# Patient Record
Sex: Female | Born: 1935 | Race: White | Hispanic: No | Smoking: Never smoker
Health system: Southern US, Community
[De-identification: ages and names within clinical notes are randomized; demographics above are authoritative.]

## PROBLEM LIST (undated history)

## (undated) DIAGNOSIS — M1990 Unspecified osteoarthritis, unspecified site: Secondary | ICD-10-CM

## (undated) DIAGNOSIS — F329 Major depressive disorder, single episode, unspecified: Secondary | ICD-10-CM

## (undated) DIAGNOSIS — K219 Gastro-esophageal reflux disease without esophagitis: Secondary | ICD-10-CM

## (undated) DIAGNOSIS — K224 Dyskinesia of esophagus: Secondary | ICD-10-CM

## (undated) DIAGNOSIS — F0390 Unspecified dementia without behavioral disturbance: Secondary | ICD-10-CM

## (undated) DIAGNOSIS — I10 Essential (primary) hypertension: Secondary | ICD-10-CM

## (undated) DIAGNOSIS — G629 Polyneuropathy, unspecified: Secondary | ICD-10-CM

## (undated) DIAGNOSIS — E039 Hypothyroidism, unspecified: Secondary | ICD-10-CM

## (undated) DIAGNOSIS — F419 Anxiety disorder, unspecified: Secondary | ICD-10-CM

## (undated) DIAGNOSIS — F32A Depression, unspecified: Secondary | ICD-10-CM

## (undated) DIAGNOSIS — K7460 Unspecified cirrhosis of liver: Secondary | ICD-10-CM

## (undated) DIAGNOSIS — G4700 Insomnia, unspecified: Secondary | ICD-10-CM

## (undated) HISTORY — DX: Unspecified dementia, unspecified severity, without behavioral disturbance, psychotic disturbance, mood disturbance, and anxiety: F03.90

## (undated) HISTORY — PX: ABDOMINAL HYSTERECTOMY: SHX81

## (undated) HISTORY — DX: Polyneuropathy, unspecified: G62.9

## (undated) HISTORY — PX: APPENDECTOMY: SHX54

## (undated) HISTORY — DX: Unspecified cirrhosis of liver: K74.60

## (undated) HISTORY — PX: VESICOVAGINAL FISTULA CLOSURE W/ TAH: SUR271

## (undated) HISTORY — DX: Dyskinesia of esophagus: K22.4

## (undated) HISTORY — PX: EYE SURGERY: SHX253

## (undated) HISTORY — DX: Anxiety disorder, unspecified: F41.9

## (undated) HISTORY — DX: Gastro-esophageal reflux disease without esophagitis: K21.9

## (undated) HISTORY — DX: Hypothyroidism, unspecified: E03.9

## (undated) HISTORY — DX: Insomnia, unspecified: G47.00

---

## 1990-07-07 DIAGNOSIS — K7460 Unspecified cirrhosis of liver: Secondary | ICD-10-CM

## 1990-07-07 HISTORY — DX: Unspecified cirrhosis of liver: K74.60

## 2001-02-11 ENCOUNTER — Ambulatory Visit (HOSPITAL_COMMUNITY): Admission: RE | Admit: 2001-02-11 | Discharge: 2001-02-11 | Payer: Self-pay | Admitting: Family Medicine

## 2001-02-11 ENCOUNTER — Encounter: Payer: Self-pay | Admitting: Family Medicine

## 2001-04-05 ENCOUNTER — Encounter: Payer: Self-pay | Admitting: Internal Medicine

## 2001-04-05 ENCOUNTER — Ambulatory Visit (HOSPITAL_COMMUNITY): Admission: RE | Admit: 2001-04-05 | Discharge: 2001-04-05 | Payer: Self-pay | Admitting: Internal Medicine

## 2001-05-11 ENCOUNTER — Encounter: Payer: Self-pay | Admitting: *Deleted

## 2001-05-11 ENCOUNTER — Emergency Department (HOSPITAL_COMMUNITY): Admission: EM | Admit: 2001-05-11 | Discharge: 2001-05-11 | Payer: Self-pay | Admitting: *Deleted

## 2001-05-17 ENCOUNTER — Ambulatory Visit (HOSPITAL_COMMUNITY): Admission: RE | Admit: 2001-05-17 | Discharge: 2001-05-17 | Payer: Self-pay | Admitting: Internal Medicine

## 2001-05-17 ENCOUNTER — Encounter: Payer: Self-pay | Admitting: Internal Medicine

## 2001-05-20 ENCOUNTER — Ambulatory Visit (HOSPITAL_COMMUNITY): Admission: RE | Admit: 2001-05-20 | Discharge: 2001-05-20 | Payer: Self-pay | Admitting: Internal Medicine

## 2001-05-20 ENCOUNTER — Encounter: Payer: Self-pay | Admitting: Internal Medicine

## 2001-05-27 ENCOUNTER — Inpatient Hospital Stay (HOSPITAL_COMMUNITY): Admission: EM | Admit: 2001-05-27 | Discharge: 2001-06-02 | Payer: Self-pay | Admitting: Emergency Medicine

## 2001-05-27 ENCOUNTER — Encounter: Payer: Self-pay | Admitting: Emergency Medicine

## 2001-05-29 ENCOUNTER — Encounter: Payer: Self-pay | Admitting: Internal Medicine

## 2001-06-01 ENCOUNTER — Encounter: Payer: Self-pay | Admitting: Internal Medicine

## 2001-06-02 ENCOUNTER — Encounter: Payer: Self-pay | Admitting: Internal Medicine

## 2001-06-25 ENCOUNTER — Other Ambulatory Visit: Admission: RE | Admit: 2001-06-25 | Discharge: 2001-06-25 | Payer: Self-pay | Admitting: Specialist

## 2001-07-02 ENCOUNTER — Ambulatory Visit (HOSPITAL_COMMUNITY): Admission: RE | Admit: 2001-07-02 | Discharge: 2001-07-02 | Payer: Self-pay | Admitting: Internal Medicine

## 2001-07-02 HISTORY — PX: ESOPHAGOGASTRODUODENOSCOPY: SHX1529

## 2001-07-05 ENCOUNTER — Ambulatory Visit (HOSPITAL_COMMUNITY): Admission: RE | Admit: 2001-07-05 | Discharge: 2001-07-05 | Payer: Self-pay | Admitting: Internal Medicine

## 2001-07-05 ENCOUNTER — Encounter (INDEPENDENT_AMBULATORY_CARE_PROVIDER_SITE_OTHER): Payer: Self-pay | Admitting: Internal Medicine

## 2001-10-04 ENCOUNTER — Encounter: Payer: Self-pay | Admitting: Internal Medicine

## 2001-10-04 ENCOUNTER — Ambulatory Visit (HOSPITAL_COMMUNITY): Admission: RE | Admit: 2001-10-04 | Discharge: 2001-10-04 | Payer: Self-pay | Admitting: Internal Medicine

## 2002-01-08 ENCOUNTER — Emergency Department (HOSPITAL_COMMUNITY): Admission: EM | Admit: 2002-01-08 | Discharge: 2002-01-08 | Payer: Self-pay | Admitting: Emergency Medicine

## 2002-01-08 ENCOUNTER — Encounter: Payer: Self-pay | Admitting: Emergency Medicine

## 2002-01-13 ENCOUNTER — Encounter: Admission: RE | Admit: 2002-01-13 | Discharge: 2002-04-13 | Payer: Self-pay | Admitting: Internal Medicine

## 2002-05-26 ENCOUNTER — Observation Stay (HOSPITAL_COMMUNITY): Admission: RE | Admit: 2002-05-26 | Discharge: 2002-05-27 | Payer: Self-pay | Admitting: Obstetrics & Gynecology

## 2002-08-17 ENCOUNTER — Encounter: Payer: Self-pay | Admitting: Internal Medicine

## 2002-08-17 ENCOUNTER — Ambulatory Visit (HOSPITAL_COMMUNITY): Admission: RE | Admit: 2002-08-17 | Discharge: 2002-08-17 | Payer: Self-pay | Admitting: Internal Medicine

## 2002-10-07 ENCOUNTER — Encounter: Payer: Self-pay | Admitting: Internal Medicine

## 2002-10-07 ENCOUNTER — Ambulatory Visit (HOSPITAL_COMMUNITY): Admission: RE | Admit: 2002-10-07 | Discharge: 2002-10-07 | Payer: Self-pay | Admitting: Internal Medicine

## 2002-12-08 ENCOUNTER — Other Ambulatory Visit: Admission: RE | Admit: 2002-12-08 | Discharge: 2002-12-08 | Payer: Self-pay | Admitting: Dermatology

## 2003-02-03 ENCOUNTER — Ambulatory Visit (HOSPITAL_COMMUNITY): Admission: RE | Admit: 2003-02-03 | Discharge: 2003-02-03 | Payer: Self-pay | Admitting: Internal Medicine

## 2003-02-03 ENCOUNTER — Encounter: Payer: Self-pay | Admitting: Internal Medicine

## 2003-05-22 ENCOUNTER — Other Ambulatory Visit: Admission: RE | Admit: 2003-05-22 | Discharge: 2003-05-22 | Payer: Self-pay | Admitting: Dermatology

## 2003-10-09 ENCOUNTER — Ambulatory Visit (HOSPITAL_COMMUNITY): Admission: RE | Admit: 2003-10-09 | Discharge: 2003-10-09 | Payer: Self-pay | Admitting: Internal Medicine

## 2003-10-26 ENCOUNTER — Ambulatory Visit (HOSPITAL_COMMUNITY): Admission: RE | Admit: 2003-10-26 | Discharge: 2003-10-26 | Payer: Self-pay | Admitting: Internal Medicine

## 2004-02-08 ENCOUNTER — Ambulatory Visit (HOSPITAL_COMMUNITY): Admission: RE | Admit: 2004-02-08 | Discharge: 2004-02-08 | Payer: Self-pay | Admitting: Internal Medicine

## 2004-04-12 ENCOUNTER — Ambulatory Visit (HOSPITAL_COMMUNITY): Admission: RE | Admit: 2004-04-12 | Discharge: 2004-04-12 | Payer: Self-pay | Admitting: Internal Medicine

## 2004-04-22 ENCOUNTER — Emergency Department (HOSPITAL_COMMUNITY): Admission: EM | Admit: 2004-04-22 | Discharge: 2004-04-22 | Payer: Self-pay | Admitting: Emergency Medicine

## 2004-05-27 ENCOUNTER — Ambulatory Visit: Payer: Self-pay | Admitting: Urgent Care

## 2004-07-07 DIAGNOSIS — K224 Dyskinesia of esophagus: Secondary | ICD-10-CM

## 2004-07-07 HISTORY — DX: Dyskinesia of esophagus: K22.4

## 2004-07-24 ENCOUNTER — Ambulatory Visit: Payer: Self-pay | Admitting: Internal Medicine

## 2004-09-25 ENCOUNTER — Ambulatory Visit: Payer: Self-pay | Admitting: Internal Medicine

## 2004-11-07 ENCOUNTER — Inpatient Hospital Stay (HOSPITAL_COMMUNITY): Admission: EM | Admit: 2004-11-07 | Discharge: 2004-11-18 | Payer: Self-pay | Admitting: Emergency Medicine

## 2004-11-08 HISTORY — PX: ESOPHAGOGASTRODUODENOSCOPY: SHX1529

## 2004-11-11 ENCOUNTER — Ambulatory Visit: Payer: Self-pay | Admitting: Urgent Care

## 2004-12-04 ENCOUNTER — Emergency Department (HOSPITAL_COMMUNITY): Admission: EM | Admit: 2004-12-04 | Discharge: 2004-12-04 | Payer: Self-pay | Admitting: Emergency Medicine

## 2004-12-30 ENCOUNTER — Ambulatory Visit: Payer: Self-pay | Admitting: Internal Medicine

## 2005-01-29 ENCOUNTER — Emergency Department (HOSPITAL_COMMUNITY): Admission: EM | Admit: 2005-01-29 | Discharge: 2005-01-29 | Payer: Self-pay | Admitting: Emergency Medicine

## 2005-02-25 ENCOUNTER — Ambulatory Visit: Payer: Self-pay | Admitting: Gastroenterology

## 2005-04-16 ENCOUNTER — Ambulatory Visit: Payer: Self-pay | Admitting: Internal Medicine

## 2005-05-07 ENCOUNTER — Inpatient Hospital Stay (HOSPITAL_COMMUNITY): Admission: AD | Admit: 2005-05-07 | Discharge: 2005-05-16 | Payer: Self-pay | Admitting: Internal Medicine

## 2005-05-12 ENCOUNTER — Ambulatory Visit: Payer: Self-pay | Admitting: Internal Medicine

## 2005-05-19 ENCOUNTER — Encounter (HOSPITAL_COMMUNITY): Admission: RE | Admit: 2005-05-19 | Discharge: 2005-06-18 | Payer: Self-pay | Admitting: Orthopaedic Surgery

## 2005-05-26 ENCOUNTER — Ambulatory Visit: Payer: Self-pay | Admitting: Internal Medicine

## 2005-06-26 ENCOUNTER — Ambulatory Visit (HOSPITAL_COMMUNITY): Admission: RE | Admit: 2005-06-26 | Discharge: 2005-06-26 | Payer: Self-pay | Admitting: Cardiovascular Disease

## 2005-07-31 ENCOUNTER — Inpatient Hospital Stay (HOSPITAL_COMMUNITY): Admission: EM | Admit: 2005-07-31 | Discharge: 2005-08-05 | Payer: Self-pay | Admitting: Emergency Medicine

## 2005-08-08 ENCOUNTER — Inpatient Hospital Stay (HOSPITAL_COMMUNITY): Admission: EM | Admit: 2005-08-08 | Discharge: 2005-08-12 | Payer: Self-pay | Admitting: Emergency Medicine

## 2005-08-25 ENCOUNTER — Ambulatory Visit: Payer: Self-pay | Admitting: Internal Medicine

## 2005-09-10 ENCOUNTER — Inpatient Hospital Stay (HOSPITAL_COMMUNITY): Admission: EM | Admit: 2005-09-10 | Discharge: 2005-09-16 | Payer: Self-pay | Admitting: Emergency Medicine

## 2005-09-10 ENCOUNTER — Ambulatory Visit: Payer: Self-pay | Admitting: Internal Medicine

## 2005-10-14 ENCOUNTER — Ambulatory Visit (HOSPITAL_COMMUNITY): Admission: RE | Admit: 2005-10-14 | Discharge: 2005-10-14 | Payer: Self-pay | Admitting: Urology

## 2005-10-15 IMAGING — RF DG ESOPHAGUS
8 series · 8 of 8 positions shown · non-contrast
Comparison: none.

CLINICAL DATA: Dysphagia and reflux.
DIAGNOSTIC ESOPHAGRAM

[Series 1: run · 1 of 1 slices shown (1 of 8)]
[im 1/1]
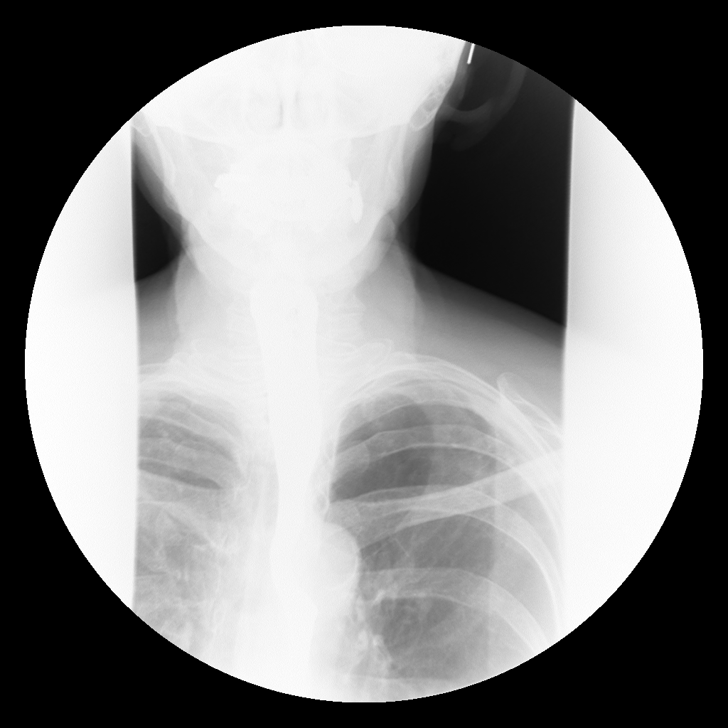

[Series 2: run · 1 of 1 slices shown (2 of 8)]
[im 1/1]
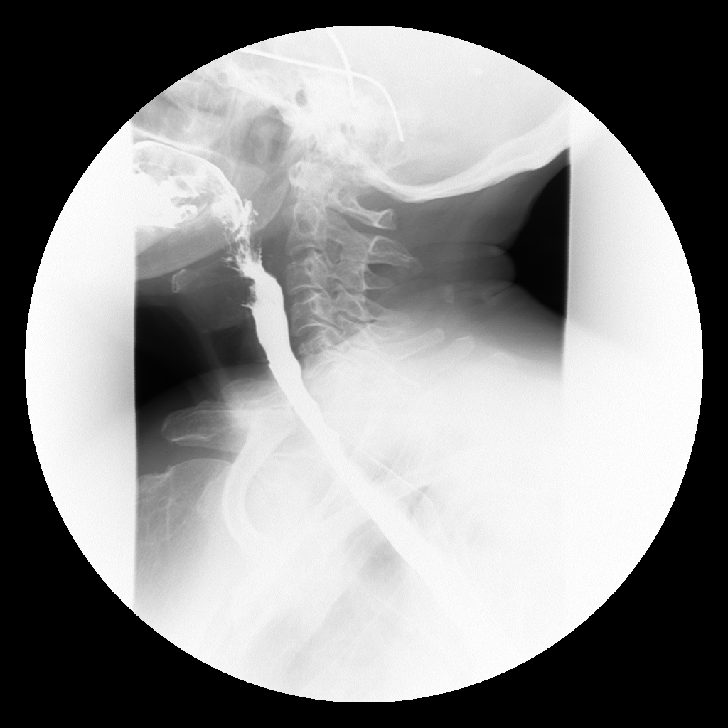

[Series 3: run · 1 of 1 slices shown (3 of 8)]
[im 1/1]
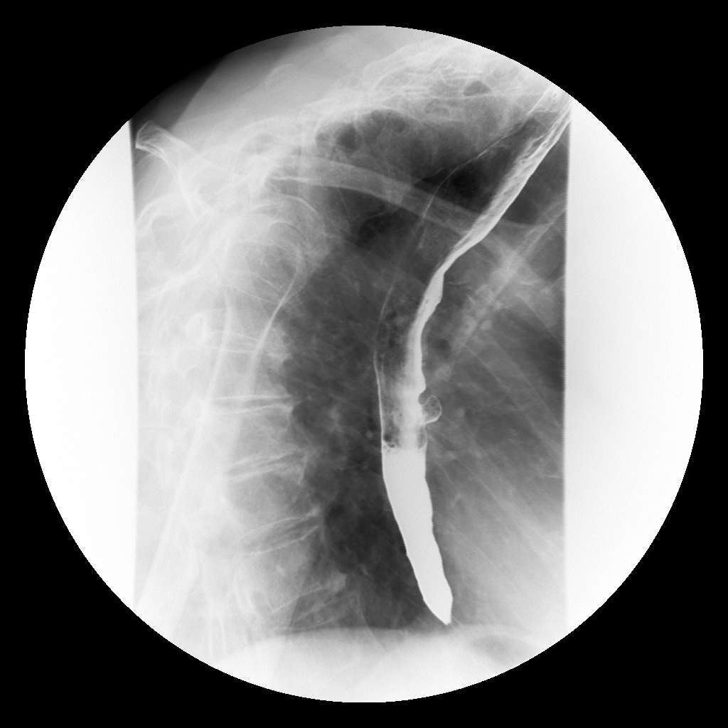

[Series 4: run · 1 of 1 slices shown (4 of 8)]
[im 1/1]
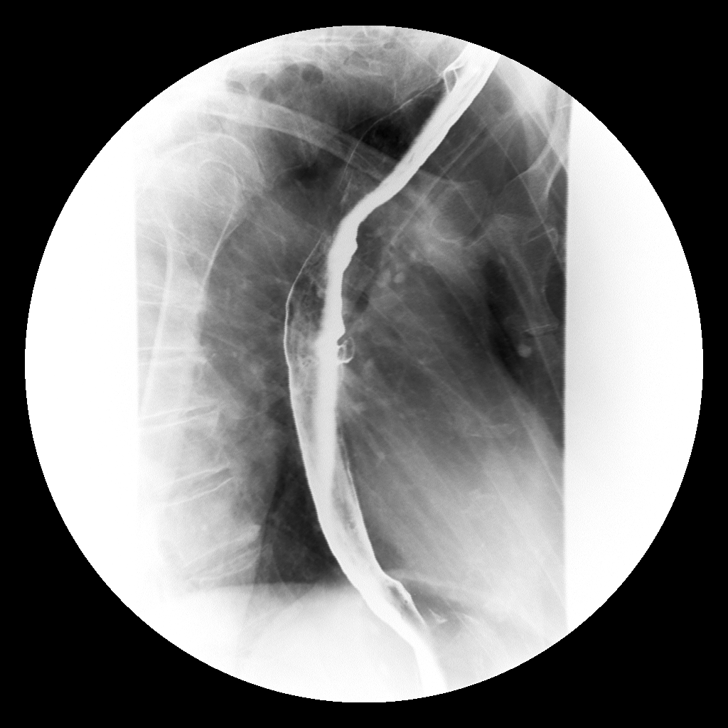

[Series 6: run · 1 of 1 slices shown (5 of 8)]
[im 1/1]
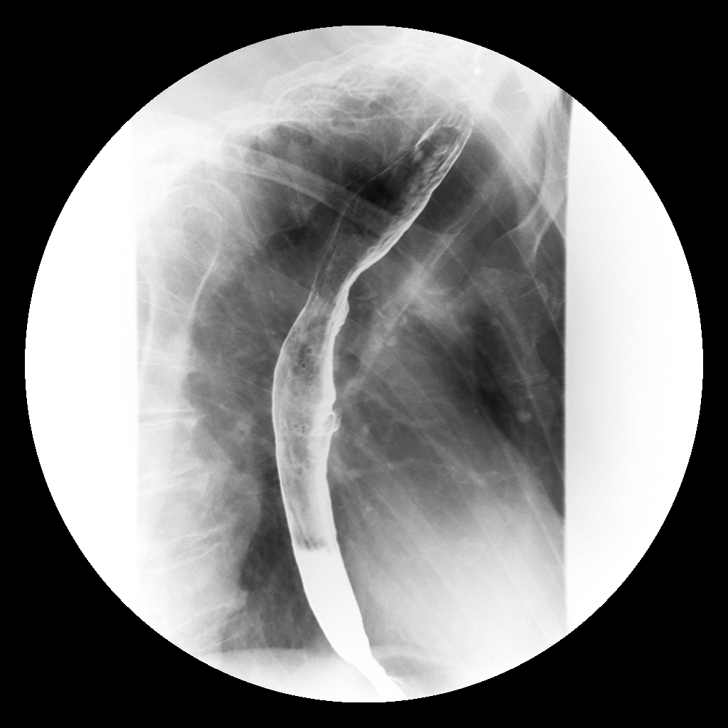

[Series 9: run · 1 of 1 slices shown (6 of 8)]
[im 1/1]
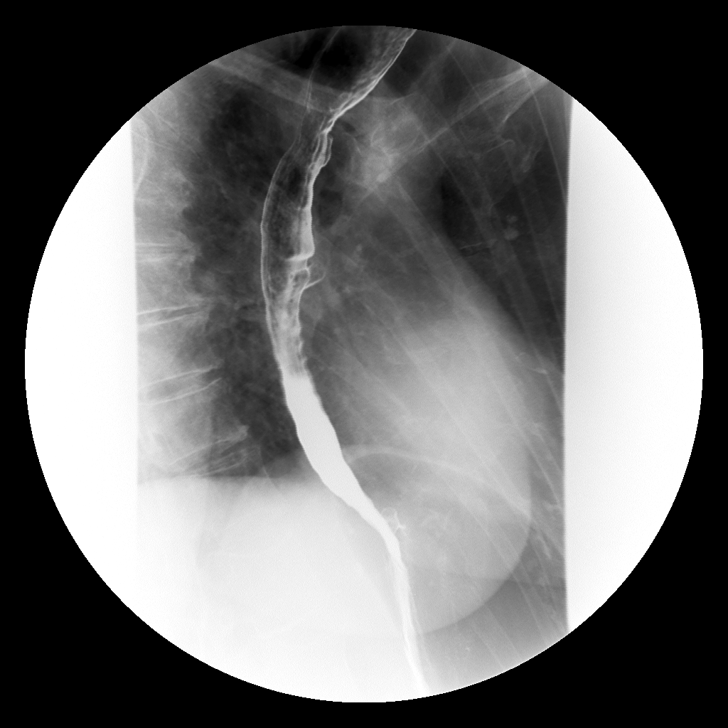

[Series 10: run · 1 of 1 slices shown (7 of 8)]
[im 1/1]
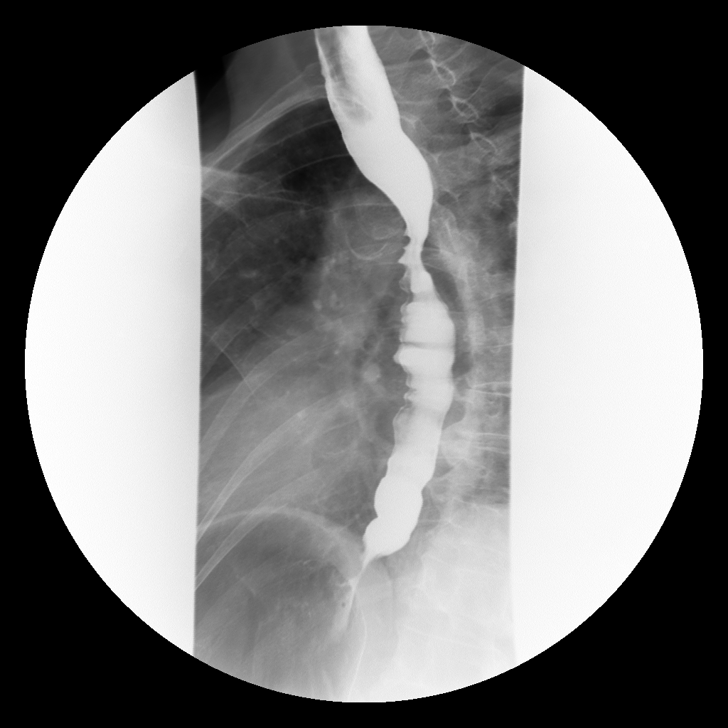

[Series 11: run · 1 of 1 slices shown (8 of 8)]
[im 1/1]
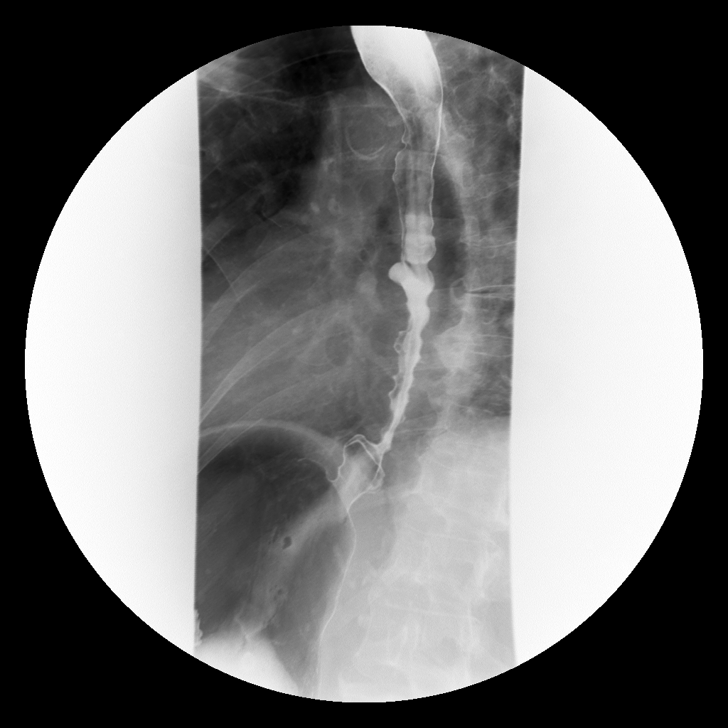

[8 of 8 positions shown; findings below may reference images not displayed]

FINDINGS: Double contrast barium esophagram shows no evidence for esophageal stricture, mucosal irregularity or mucosal ulceration. The patient does have a traction diverticulum in the mid esophagus.  Small hiatal hernia is noted.  There is no evidence for esophageal fold thickening to suggest esophagitis.
Swallowing in an RAO prone position demonstrates lack of primary peristalsis on [DATE] swallows. The patient also had multiple tertiary contractions while swallowing.
13 millimeter barium tablet was given in an upright position and the tablet initially lodged in the proximal thoracic esophagus which reproduced the discomfort she was experiencing at home.  The tablet did subsequently pass the length of the esophagus into the stomach without additional swallows of water.
IMPRESSION
Traction diverticulum in the mid esophagus.
Nonspecific esophageal motility disorder with multiple tertiary contractions noted.
Small hiatal hernia.

## 2005-11-24 ENCOUNTER — Ambulatory Visit: Payer: Self-pay | Admitting: Internal Medicine

## 2006-02-25 ENCOUNTER — Ambulatory Visit: Payer: Self-pay | Admitting: Internal Medicine

## 2006-03-13 ENCOUNTER — Ambulatory Visit (HOSPITAL_COMMUNITY): Admission: RE | Admit: 2006-03-13 | Discharge: 2006-03-13 | Payer: Self-pay | Admitting: Internal Medicine

## 2006-05-14 ENCOUNTER — Ambulatory Visit (HOSPITAL_COMMUNITY): Admission: RE | Admit: 2006-05-14 | Discharge: 2006-05-14 | Payer: Self-pay | Admitting: Urology

## 2006-07-16 ENCOUNTER — Ambulatory Visit: Payer: Self-pay | Admitting: Internal Medicine

## 2006-10-16 ENCOUNTER — Emergency Department (HOSPITAL_COMMUNITY): Admission: EM | Admit: 2006-10-16 | Discharge: 2006-10-16 | Payer: Self-pay | Admitting: Emergency Medicine

## 2007-02-05 ENCOUNTER — Ambulatory Visit: Payer: Self-pay | Admitting: Gastroenterology

## 2007-04-28 ENCOUNTER — Emergency Department (HOSPITAL_COMMUNITY): Admission: EM | Admit: 2007-04-28 | Discharge: 2007-04-28 | Payer: Self-pay | Admitting: Emergency Medicine

## 2007-04-30 ENCOUNTER — Ambulatory Visit (HOSPITAL_COMMUNITY): Admission: RE | Admit: 2007-04-30 | Discharge: 2007-04-30 | Payer: Self-pay | Admitting: Internal Medicine

## 2007-07-12 ENCOUNTER — Ambulatory Visit (HOSPITAL_COMMUNITY): Admission: RE | Admit: 2007-07-12 | Discharge: 2007-07-12 | Payer: Self-pay | Admitting: Internal Medicine

## 2007-12-09 ENCOUNTER — Emergency Department (HOSPITAL_COMMUNITY): Admission: EM | Admit: 2007-12-09 | Discharge: 2007-12-09 | Payer: Self-pay | Admitting: Emergency Medicine

## 2007-12-30 ENCOUNTER — Ambulatory Visit (HOSPITAL_COMMUNITY): Admission: RE | Admit: 2007-12-30 | Discharge: 2007-12-30 | Payer: Self-pay | Admitting: Internal Medicine

## 2008-01-14 ENCOUNTER — Ambulatory Visit (HOSPITAL_COMMUNITY): Admission: RE | Admit: 2008-01-14 | Discharge: 2008-01-14 | Payer: Self-pay | Admitting: Internal Medicine

## 2009-01-05 ENCOUNTER — Ambulatory Visit (HOSPITAL_COMMUNITY): Admission: RE | Admit: 2009-01-05 | Discharge: 2009-01-05 | Payer: Self-pay | Admitting: Internal Medicine

## 2009-01-21 ENCOUNTER — Inpatient Hospital Stay (HOSPITAL_COMMUNITY): Admission: EM | Admit: 2009-01-21 | Discharge: 2009-01-26 | Payer: Self-pay | Admitting: Emergency Medicine

## 2009-01-22 ENCOUNTER — Ambulatory Visit: Payer: Self-pay | Admitting: Gastroenterology

## 2009-01-24 ENCOUNTER — Encounter: Payer: Self-pay | Admitting: Gastroenterology

## 2009-01-25 ENCOUNTER — Ambulatory Visit: Payer: Self-pay | Admitting: Gastroenterology

## 2009-03-27 DIAGNOSIS — G609 Hereditary and idiopathic neuropathy, unspecified: Secondary | ICD-10-CM | POA: Insufficient documentation

## 2009-03-27 DIAGNOSIS — G4700 Insomnia, unspecified: Secondary | ICD-10-CM | POA: Insufficient documentation

## 2009-03-27 DIAGNOSIS — E039 Hypothyroidism, unspecified: Secondary | ICD-10-CM | POA: Insufficient documentation

## 2009-03-27 DIAGNOSIS — R110 Nausea: Secondary | ICD-10-CM

## 2009-03-27 DIAGNOSIS — K219 Gastro-esophageal reflux disease without esophagitis: Secondary | ICD-10-CM

## 2009-03-27 DIAGNOSIS — R630 Anorexia: Secondary | ICD-10-CM

## 2009-03-27 DIAGNOSIS — K7469 Other cirrhosis of liver: Secondary | ICD-10-CM

## 2009-03-27 DIAGNOSIS — F411 Generalized anxiety disorder: Secondary | ICD-10-CM | POA: Insufficient documentation

## 2009-03-27 DIAGNOSIS — F068 Other specified mental disorders due to known physiological condition: Secondary | ICD-10-CM

## 2009-03-28 ENCOUNTER — Ambulatory Visit: Payer: Self-pay | Admitting: Gastroenterology

## 2009-03-29 DIAGNOSIS — K3184 Gastroparesis: Secondary | ICD-10-CM | POA: Insufficient documentation

## 2009-04-10 ENCOUNTER — Emergency Department (HOSPITAL_COMMUNITY): Admission: EM | Admit: 2009-04-10 | Discharge: 2009-04-10 | Payer: Self-pay | Admitting: Emergency Medicine

## 2009-06-14 ENCOUNTER — Ambulatory Visit: Payer: Self-pay | Admitting: Gastroenterology

## 2009-06-15 ENCOUNTER — Emergency Department (HOSPITAL_COMMUNITY): Admission: EM | Admit: 2009-06-15 | Discharge: 2009-06-15 | Payer: Self-pay | Admitting: Emergency Medicine

## 2009-07-05 ENCOUNTER — Encounter (INDEPENDENT_AMBULATORY_CARE_PROVIDER_SITE_OTHER): Payer: Self-pay | Admitting: *Deleted

## 2009-07-13 ENCOUNTER — Encounter: Payer: Self-pay | Admitting: Gastroenterology

## 2009-07-24 ENCOUNTER — Ambulatory Visit (HOSPITAL_COMMUNITY): Admission: RE | Admit: 2009-07-24 | Discharge: 2009-07-24 | Payer: Self-pay | Admitting: Gastroenterology

## 2009-10-03 ENCOUNTER — Encounter: Payer: Self-pay | Admitting: Gastroenterology

## 2009-10-08 ENCOUNTER — Telehealth (INDEPENDENT_AMBULATORY_CARE_PROVIDER_SITE_OTHER): Payer: Self-pay

## 2009-10-08 ENCOUNTER — Encounter: Payer: Self-pay | Admitting: Gastroenterology

## 2009-12-28 ENCOUNTER — Encounter (INDEPENDENT_AMBULATORY_CARE_PROVIDER_SITE_OTHER): Payer: Self-pay | Admitting: *Deleted

## 2010-04-15 ENCOUNTER — Encounter: Payer: Self-pay | Admitting: Urgent Care

## 2010-05-12 ENCOUNTER — Inpatient Hospital Stay (HOSPITAL_COMMUNITY): Admission: EM | Admit: 2010-05-12 | Discharge: 2010-05-16 | Payer: Self-pay | Admitting: Emergency Medicine

## 2010-07-02 ENCOUNTER — Encounter: Payer: Self-pay | Admitting: Gastroenterology

## 2010-07-04 ENCOUNTER — Encounter (INDEPENDENT_AMBULATORY_CARE_PROVIDER_SITE_OTHER): Payer: Self-pay | Admitting: *Deleted

## 2010-07-28 ENCOUNTER — Encounter (INDEPENDENT_AMBULATORY_CARE_PROVIDER_SITE_OTHER): Payer: Self-pay | Admitting: Internal Medicine

## 2010-07-29 ENCOUNTER — Ambulatory Visit
Admission: RE | Admit: 2010-07-29 | Discharge: 2010-07-29 | Payer: Self-pay | Source: Home / Self Care | Attending: Gastroenterology | Admitting: Gastroenterology

## 2010-07-29 ENCOUNTER — Encounter: Payer: Self-pay | Admitting: Gastroenterology

## 2010-07-29 ENCOUNTER — Encounter: Payer: Self-pay | Admitting: Internal Medicine

## 2010-08-08 NOTE — Medication Information (Signed)
Summary: I=XIFAXAN 550MG   I=XIFAXAN 550MG    Imported By: Rexene Alberts 07/02/2010 09:33:23  _____________________________________________________________________  External Attachment:    Type:   Image     Comment:   External Document  Appended Document: I=XIFAXAN 550MG  see append on 12/23.

## 2010-08-08 NOTE — Assessment & Plan Note (Signed)
Summary: fu cirrhosis/ss   Visit Type:  Follow-up Visit Primary Care Provider:  Sherwood Gambler, M.D.  CC:  follow up- doing ok.  History of Present Illness: [name] is a pleasant 77 year old Caucasian female with a hx of cryptogenic cirrhosis, gastroparesis, and dementia who presents in follow-up. Her last AFP was drawn in April 2011: 11.8. Due for repeat. Last screening for [facility] was done in January 2011 via CT; no evidence of HCC, but a question of esophageal varices. Last documented wt here was 152 in December 2010. Today 136.  Denies abdominal pain, N/V. Has at least 2 BMs daily. No rectal bleeding. Denies edema. States she really doesn't want to eat sometimes secondary to feeling down. A little depressed, as reportedly son was taken to the hospital yesterday due to pneumonia. She has a caregiver during the day; son stays with her at night. She doesn't like boost or ensure for meal supplementation. States makes her vomit. Denies dysphagia/odynophagia.   Current Medications (verified): 1)  Potassium Chloride Cr 10 Meq Cr-Tabs (Potassium Chloride) .... Two Times A Day 2)  Synthroid 75 Mcg Tabs (Levothyroxine Sodium) .... Once Daily 3)  Triazolam 0.25 Mg Tabs (Triazolam) .... At Bedtime 4)  Aricept 5 Mg Tabs (Donepezil Hcl) .... Once Daily 5)  Nexium 40 Mg Pack (Esomeprazole Magnesium) .... As Directed 6)  Lyrica 100 Mg Caps (Pregabalin) .... Two Times A Day 7)  Calcium 600 Mg Tabs (Calcium) .... Once Daily 8)  Xifaxan 550 Mg Tabs (Rifaximin) .... 2 By Mouth Daily 9)  Furosemide 20 Mg Tabs (Furosemide) .... Take 1 Tablet By Mouth Once A Day 10)  Metoclopramide Hcl 5 Mg Tabs (Metoclopramide Hcl) .... As Directed 11)  Neurpath B .... As Directed 12)  Lactulose .... As Directed 13)  Vesicare 5 Mg Tabs (Solifenacin Succinate) .... Once Daily 14)  Metoprolol Tartrate 50 Mg Tabs (Metoprolol Tartrate) .... 1/2 Once Daily  Allergies (verified): 1)  ! Sulfa 2)  ! * Ambien 3)  ! Codeine  Past  History:  Past Medical History: Reviewed history from 03/28/2009 and no changes required. Cirrhosis, CRYPTOGENIC 1992 NONSPECIFIC esophageal motility disorder 2006 GERD Peripheral neuropathy Insomnia Dementia Hypothyroidism Anxiety Disorder  Past Surgical History: APPENDECTOMY HYSTERECTOMY eye surgery  Family History: Mother:deceased when pt was young child Father: deceased when pt was young child Sister: Diabetes, deceased  No FH of Colon Cancer:  Social History: Patient has never smoked.  Alcohol Use - no Smoking Status:  never  Review of Systems General:  Denies fever, chills, and anorexia. Eyes:  Denies blurring, irritation, and discharge. ENT:  Denies sore throat and hoarseness. CV:  Denies chest pains and syncope. Resp:  Denies dyspnea at rest and wheezing. GI:  Denies difficulty swallowing, pain on swallowing, nausea, indigestion/heartburn, abdominal pain, constipation, change in bowel habits, bloody BM's, and black BMs. GU:  Denies urinary burning and urinary frequency. MS:  Denies joint pain / LOM, joint swelling, and joint stiffness. Derm:  Denies rash, itching, and dry skin. Neuro:  Denies weakness and syncope. Psych:  Complains of depression; denies anxiety and suicidal ideation. Endo:  Denies cold intolerance and heat intolerance. Heme:  Denies bruising and bleeding.  Vital Signs:  Patient profile:   77 year old female Height:      64 inches Weight:      136 pounds BMI:     23.43 Temp:     98.0 degrees F oral Pulse rate:   76 / minute BP sitting:   124 / 80  (  right arm) Cuff size:   regular  Vitals Entered By: Hendricks Limes LPN (July 29, 2010 2:19 PM)  Physical Exam  General:  NAD, alert to person, time, situation. Unsure of president Head:  Normocephalic and atraumatic. Eyes:  sclera without icterus Lungs:  Clear throughout to auscultation. Heart:  Regular rate and rhythm; no murmurs, rubs,  or bruits. Abdomen:  +BS, non-tender,  non-distended. No HSM. NO rebound or guarding. Limited exam as pt was sitting in chair. Difficult to get up on exam table.  Pulses:  Normal pulses noted. Neurologic:  alert to person, place, situation. unsure of president.  Psych:  pleasant, somewhat flat affect. Worried about son in hospital    Impression & Recommendations:  Problem # 1:  CIRRHOSIS (ICD-34.72) 77 year old pleasant female with hx of crypotogenic cirrhosis. Last AFP in April elevated at 11.8; last radiologic exam was CT in Jan 2011: no evidence of HCC but question of esophageal varices. Due for repeat labs, CT or [place] of abd to be determined after review of AFP. If AFP stabilized, will proceed with [place] of abd; if elevated, will proceed with CTA. ?of esophageal varices, pt refuses upper endoscopy. question benefit of non-selective BB for pt. Will d/w Dr. Jena Gauss. Wt slightly down from last visit over a year ago (152 to 136). Upon review of chart, has had issues with fluctuation of wt over past several years. Will bring back for wt check in 4 weeks, assess labs.   AFP, CBC, CMP, PT/INR today Review labs to determine CTA vs [place] of abd Denies EGD to assess for ?varices: d/w Dr. Jena Gauss   Orders: T-CBC w/Diff [phone]) T-Comprehensive Metabolic Panel [phone]) T-PT (Prothrombin Time) (46962) T-AFP Tumor Markers ([id number]) Est. Patient Level II (01027)

## 2010-08-08 NOTE — Progress Notes (Signed)
Summary: ? about labwork  Phone Note Other Incoming   Caller: Larita Fife from [employer] Call: Larita Fife from [facility] called- pt came by to have some bloodwork done and they had an order for a creatinine and AFP tumor marker. They drew enough blood for it but since labs were dated January they wanted to make sure we still needed them. If not they can discard the blood. please advise. Initial call taken by: Hendricks Limes LPN,  October 08, 2009 11:49 AM     Appended Document: ? about labwork Pt needs the AFP not creatinine.   Appended Document: ? about labwork Larita Fife at [facility] aware

## 2010-08-08 NOTE — Letter (Signed)
Summary: Recall Office Visit  [facility]  [address]   [place], [place] [address]   Phone: [phone]  Fax: [phone]      July 04, 2010   [name] [address] [place], [place]  [address] [birth year]   Dear [name],   According to our records, it is time for you to schedule a follow-up office visit with [place].   At your convenience, please call [phone] to schedule an office visit. If you have any questions, concerns, or feel that this letter is in error, we would appreciate your call.   Sincerely,    Diana Eves  [facility] Ph: [phone]   Fax: [phone]

## 2010-08-08 NOTE — Letter (Signed)
Summary: Recall Office Visit  [facility]  [address]   [place], [place] [address]   Phone: [phone]  Fax: [phone]      December 28, 2009   TANEA MOGA [address] [place], [place]  [address] [birth year]   Dear Ms. Enoch,   According to our records, it is time for you to schedule a follow-up office visit with [place].   At your convenience, please call [phone] to schedule an office visit. If you have any questions, concerns, or feel that this letter is in error, we would appreciate your call.   Sincerely,    Diana Eves  [facility] Ph: [phone]   Fax: [phone]  Appended Document: Recall Office Visit Patient called and stated she did not want to be seen right now and will call back when she wants to be seen.  Patient is doing good.  jb

## 2010-08-08 NOTE — Letter (Signed)
Summary: CT OF THE ABD ORDER  CT OF THE ABD ORDER   Imported By: Ave Filter 07/13/2009 12:57:57  _____________________________________________________________________  External Attachment:    Type:   Image     Comment:   External Document

## 2010-08-09 NOTE — Medication Information (Signed)
Summary: Tax adviser   Imported By: Diana Eves 10/03/2009 08:39:15  _____________________________________________________________________  External Attachment:    Type:   Image     Comment:   External Document  Appended Document: RX Folder - xifaxan    Prescriptions: XIFAXAN 550 MG TABS (RIFAXIMIN) 2 by mouth daily  #60 x 5   Entered and Authorized by:   Leanna Battles. Dixon Boos   Signed by:   Leanna Battles Dixon Boos on 10/03/2009   Method used:   Electronically to        [facility] (retail)       [address]       [place], [place]  [address]       Ph: [phone]       Fax: [phone]   RxID:   [id number]

## 2010-08-09 NOTE — Medication Information (Signed)
Summary: [employer] [employer] 550MG   XIFAXAN 550MG    Imported By: Rexene Alberts 04/15/2010 10:11:42  _____________________________________________________________________  External Attachment:    Type:   Image     Comment:   External Document  Appended Document: XIFAXAN 550MG  Please sched OV for FU prior to further RFs.   Prescriptions: XIFAXAN 550 MG TABS (RIFAXIMIN) 2 by mouth daily  #60 x 1   Entered and Authorized by:   Joselyn Arrow FNP-BC   Signed by:   Joselyn Arrow FNP-BC on 04/15/2010   Method used:   Electronically to        [facility] (retail)       [address]       [place], [place]  [address]       Ph: [phone]       Fax: [phone]   RxID:   [id number]

## 2010-08-12 ENCOUNTER — Other Ambulatory Visit: Payer: Self-pay | Admitting: Internal Medicine

## 2010-08-12 ENCOUNTER — Encounter: Payer: Self-pay | Admitting: Internal Medicine

## 2010-08-12 DIAGNOSIS — C220 Liver cell carcinoma: Secondary | ICD-10-CM

## 2010-08-12 LAB — CONVERTED CEMR LAB
ALT: 15 units/L (ref 0–35)
AST: 30 units/L (ref 0–37)
Alkaline Phosphatase: 109 units/L (ref 39–117)
BUN: 19 mg/dL (ref 6–23)
Basophils Absolute: 0 10*3/uL (ref 0.0–0.1)
Basophils Relative: 1 % (ref 0–1)
Creatinine, Ser: 1.1 mg/dL (ref 0.40–1.20)
Eosinophils Absolute: 0.1 10*3/uL (ref 0.0–0.7)
INR: 1.16 (ref <1.50)
Lymphs Abs: 1 10*3/uL (ref 0.7–4.0)
MCHC: 31.6 g/dL (ref 30.0–36.0)
MCV: 96.7 fL (ref 78.0–100.0)
Neutrophils Relative %: 71 % (ref 43–77)
Platelets: 96 10*3/uL — ABNORMAL LOW (ref 150–400)
Prothrombin Time: 15 s (ref 11.6–15.2)
RDW: 14.2 % (ref 11.5–15.5)
Total Bilirubin: 0.9 mg/dL (ref 0.3–1.2)
WBC: 6.6 10*3/uL (ref 4.0–10.5)

## 2010-08-15 ENCOUNTER — Encounter: Payer: Self-pay | Admitting: Gastroenterology

## 2010-08-15 ENCOUNTER — Ambulatory Visit (HOSPITAL_COMMUNITY)
Admission: RE | Admit: 2010-08-15 | Discharge: 2010-08-15 | Disposition: A | Discharge status: Home / Self Care | Payer: PRIVATE HEALTH INSURANCE | Source: Ambulatory Visit | Attending: Internal Medicine | Admitting: Internal Medicine

## 2010-08-15 ENCOUNTER — Other Ambulatory Visit (HOSPITAL_COMMUNITY): Payer: Self-pay

## 2010-08-15 DIAGNOSIS — C220 Liver cell carcinoma: Secondary | ICD-10-CM

## 2010-08-15 DIAGNOSIS — K7460 Unspecified cirrhosis of liver: Secondary | ICD-10-CM | POA: Insufficient documentation

## 2010-08-15 DIAGNOSIS — K5730 Diverticulosis of large intestine without perforation or abscess without bleeding: Secondary | ICD-10-CM | POA: Insufficient documentation

## 2010-08-15 DIAGNOSIS — Z8542 Personal history of malignant neoplasm of other parts of uterus: Secondary | ICD-10-CM | POA: Insufficient documentation

## 2010-08-15 MED ORDER — IOHEXOL 300 MG/ML  SOLN
100.0000 mL | Freq: Once | INTRAMUSCULAR | Status: AC | PRN
  Administered 2010-08-15: 100 mL via INTRAVENOUS

## 2010-08-22 NOTE — Letter (Signed)
Summary: CT SCAN OF ABD/PELVIS ORDER  CT SCAN OF ABD/PELVIS ORDER   Imported By: Ave Filter 08/12/2010 11:53:53  _____________________________________________________________________  External Attachment:    Type:   Image     Comment:   External Document  Appended Document: CT SCAN OF ABD/PELVIS ORDER spoke with physician reviewer, received auth#: [id number]  Expires: 09/28/10  Appended Document: CT SCAN OF ABD/PELVIS ORDER Noted.

## 2010-08-22 NOTE — Medication Information (Signed)
Summary: [employer] 550MG   XIFAXAN 550MG    Imported By: Rexene Alberts 08/15/2010 09:55:32  _____________________________________________________________________  External Attachment:    Type:   Image     Comment:   External Document  Appended Document: [employer] 550MG     Prescriptions: XIFAXAN 550 MG TABS (RIFAXIMIN) 2 by mouth daily  #60 x 3   Entered and Authorized by:   Gerrit Halls NP   Signed by:   Gerrit Halls NP on 08/15/2010   Method used:   Faxed to ...       [facility] (retail)       [address]       [place], [place]  [address]       Ph: [phone]       Fax: [phone]   RxID:   [id number]

## 2010-09-17 LAB — TROPONIN I: Troponin I: 0.03 ng/mL (ref 0.00–0.06)

## 2010-09-17 LAB — COMPREHENSIVE METABOLIC PANEL
ALT: 15 U/L (ref 0–35)
ALT: 17 U/L (ref 0–35)
ALT: 20 U/L (ref 0–35)
AST: 33 U/L (ref 0–37)
Albumin: 2.5 g/dL — ABNORMAL LOW (ref 3.5–5.2)
Alkaline Phosphatase: 69 U/L (ref 39–117)
BUN: 14 mg/dL (ref 6–23)
CO2: 23 mEq/L (ref 19–32)
CO2: 26 mEq/L (ref 19–32)
Calcium: 8.4 mg/dL (ref 8.4–10.5)
Calcium: 8.8 mg/dL (ref 8.4–10.5)
Chloride: 111 mEq/L (ref 96–112)
Chloride: 114 mEq/L — ABNORMAL HIGH (ref 96–112)
Creatinine, Ser: 1.02 mg/dL (ref 0.4–1.2)
Creatinine, Ser: 1.18 mg/dL (ref 0.4–1.2)
GFR calc non Af Amer: 44 mL/min — ABNORMAL LOW (ref >60)
GFR calc non Af Amer: 52 mL/min — ABNORMAL LOW (ref >60)
Glucose, Bld: 89 mg/dL (ref 70–99)
Glucose, Bld: 90 mg/dL (ref 70–99)
Glucose, Bld: 99 mg/dL (ref 70–99)
Potassium: 4.1 mEq/L (ref 3.5–5.1)
Sodium: 144 mEq/L (ref 135–145)
Sodium: 144 mEq/L (ref 135–145)
Total Bilirubin: 0.9 mg/dL (ref 0.3–1.2)
Total Bilirubin: 1 mg/dL (ref 0.3–1.2)
Total Protein: 4.9 g/dL — ABNORMAL LOW (ref 6.0–8.3)
Total Protein: 5.3 g/dL — ABNORMAL LOW (ref 6.0–8.3)

## 2010-09-17 LAB — DIFFERENTIAL
Basophils Absolute: 0 10*3/uL (ref 0.0–0.1)
Basophils Absolute: 0 10*3/uL (ref 0.0–0.1)
Basophils Absolute: 0 10*3/uL (ref 0.0–0.1)
Basophils Relative: 0 % (ref 0–1)
Basophils Relative: 1 % (ref 0–1)
Eosinophils Absolute: 0.1 10*3/uL (ref 0.0–0.7)
Eosinophils Absolute: 0.1 10*3/uL (ref 0.0–0.7)
Eosinophils Absolute: 0.1 10*3/uL (ref 0.0–0.7)
Eosinophils Absolute: 0.2 10*3/uL (ref 0.0–0.7)
Eosinophils Relative: 2 % (ref 0–5)
Eosinophils Relative: 3 % (ref 0–5)
Eosinophils Relative: 4 % (ref 0–5)
Lymphocytes Relative: 23 % (ref 12–46)
Lymphs Abs: 0.8 10*3/uL (ref 0.7–4.0)
Lymphs Abs: 0.9 10*3/uL (ref 0.7–4.0)
Monocytes Absolute: 0.3 10*3/uL (ref 0.1–1.0)
Monocytes Absolute: 0.3 10*3/uL (ref 0.1–1.0)
Monocytes Absolute: 0.5 10*3/uL (ref 0.1–1.0)
Monocytes Relative: 10 % (ref 3–12)
Monocytes Relative: 11 % (ref 3–12)
Neutrophils Relative %: 60 % (ref 43–77)
Neutrophils Relative %: 73 % (ref 43–77)

## 2010-09-17 LAB — CBC
HCT: 35.1 % — ABNORMAL LOW (ref 36.0–46.0)
HCT: 36 % (ref 36.0–46.0)
HCT: 38.8 % (ref 36.0–46.0)
HCT: 39.5 % (ref 36.0–46.0)
Hemoglobin: 12.2 g/dL (ref 12.0–15.0)
Hemoglobin: 13.4 g/dL (ref 12.0–15.0)
MCH: 31.2 pg (ref 26.0–34.0)
MCH: 31.3 pg (ref 26.0–34.0)
MCHC: 34 g/dL (ref 30.0–36.0)
MCHC: 34 g/dL (ref 30.0–36.0)
MCV: 91.5 fL (ref 78.0–100.0)
MCV: 92.1 fL (ref 78.0–100.0)
Platelets: 71 10*3/uL — ABNORMAL LOW (ref 150–400)
RBC: 3.84 MIL/uL — ABNORMAL LOW (ref 3.87–5.11)
RBC: 3.91 MIL/uL (ref 3.87–5.11)
RBC: 4.28 MIL/uL (ref 3.87–5.11)
RDW: 14 % (ref 11.5–15.5)
RDW: 14.1 % (ref 11.5–15.5)
WBC: 3.2 10*3/uL — ABNORMAL LOW (ref 4.0–10.5)
WBC: 3.4 10*3/uL — ABNORMAL LOW (ref 4.0–10.5)
WBC: 5.6 10*3/uL (ref 4.0–10.5)

## 2010-09-17 LAB — URINALYSIS, ROUTINE W REFLEX MICROSCOPIC
Bilirubin Urine: NEGATIVE
Glucose, UA: NEGATIVE mg/dL
Ketones, ur: NEGATIVE mg/dL
Nitrite: NEGATIVE
Specific Gravity, Urine: 1.01 (ref 1.005–1.030)
pH: 6.5 (ref 5.0–8.0)

## 2010-09-17 LAB — CK TOTAL AND CKMB (NOT AT ARMC)
CK, MB: 1.2 ng/mL (ref 0.3–4.0)
Relative Index: INVALID (ref 0.0–2.5)
Total CK: 45 U/L (ref 7–177)

## 2010-09-17 LAB — LIPASE, BLOOD: Lipase: 64 U/L — ABNORMAL HIGH (ref 11–59)

## 2010-10-07 ENCOUNTER — Emergency Department (HOSPITAL_COMMUNITY): Payer: PRIVATE HEALTH INSURANCE

## 2010-10-07 ENCOUNTER — Emergency Department (HOSPITAL_COMMUNITY)
Admission: EM | Admit: 2010-10-07 | Discharge: 2010-10-07 | Disposition: A | Discharge status: Home / Self Care | Payer: PRIVATE HEALTH INSURANCE | Attending: Emergency Medicine | Admitting: Emergency Medicine

## 2010-10-07 DIAGNOSIS — G319 Degenerative disease of nervous system, unspecified: Secondary | ICD-10-CM | POA: Insufficient documentation

## 2010-10-07 DIAGNOSIS — R51 Headache: Secondary | ICD-10-CM | POA: Insufficient documentation

## 2010-10-07 DIAGNOSIS — Z79899 Other long term (current) drug therapy: Secondary | ICD-10-CM | POA: Insufficient documentation

## 2010-10-07 DIAGNOSIS — I6789 Other cerebrovascular disease: Secondary | ICD-10-CM | POA: Insufficient documentation

## 2010-10-07 DIAGNOSIS — F29 Unspecified psychosis not due to a substance or known physiological condition: Secondary | ICD-10-CM | POA: Insufficient documentation

## 2010-10-07 DIAGNOSIS — F0280 Dementia in other diseases classified elsewhere without behavioral disturbance: Secondary | ICD-10-CM | POA: Insufficient documentation

## 2010-10-07 DIAGNOSIS — I10 Essential (primary) hypertension: Secondary | ICD-10-CM | POA: Insufficient documentation

## 2010-10-07 LAB — URINALYSIS, ROUTINE W REFLEX MICROSCOPIC
Bilirubin Urine: NEGATIVE
Glucose, UA: NEGATIVE mg/dL
Hgb urine dipstick: NEGATIVE
Protein, ur: NEGATIVE mg/dL
Urobilinogen, UA: 0.2 mg/dL (ref 0.0–1.0)

## 2010-10-07 LAB — COMPREHENSIVE METABOLIC PANEL
ALT: 22 U/L (ref 0–35)
Albumin: 3.2 g/dL — ABNORMAL LOW (ref 3.5–5.2)
Alkaline Phosphatase: 108 U/L (ref 39–117)
BUN: 20 mg/dL (ref 6–23)
Chloride: 108 mEq/L (ref 96–112)
Glucose, Bld: 87 mg/dL (ref 70–99)
Potassium: 4.1 mEq/L (ref 3.5–5.1)
Sodium: 139 mEq/L (ref 135–145)
Total Bilirubin: 1.1 mg/dL (ref 0.3–1.2)
Total Protein: 6.3 g/dL (ref 6.0–8.3)

## 2010-10-07 LAB — DIFFERENTIAL
Basophils Absolute: 0 10*3/uL (ref 0.0–0.1)
Basophils Relative: 1 % (ref 0–1)
Eosinophils Absolute: 0.2 10*3/uL (ref 0.0–0.7)
Monocytes Absolute: 0.6 10*3/uL (ref 0.1–1.0)
Monocytes Relative: 11 % (ref 3–12)
Neutro Abs: 4 10*3/uL (ref 1.7–7.7)

## 2010-10-07 LAB — CBC
MCH: 30.9 pg (ref 26.0–34.0)
MCHC: 33.3 g/dL (ref 30.0–36.0)
Platelets: 95 10*3/uL — ABNORMAL LOW (ref 150–400)
RDW: 13.1 % (ref 11.5–15.5)

## 2010-10-07 LAB — PROTIME-INR
INR: 1.14 (ref 0.00–1.49)
Prothrombin Time: 14.8 seconds (ref 11.6–15.2)

## 2010-10-07 LAB — URINE MICROSCOPIC-ADD ON

## 2010-10-08 LAB — URINALYSIS, ROUTINE W REFLEX MICROSCOPIC
Glucose, UA: NEGATIVE mg/dL
Ketones, ur: NEGATIVE mg/dL
Nitrite: NEGATIVE
Specific Gravity, Urine: 1.01 (ref 1.005–1.030)
pH: 7 (ref 5.0–8.0)

## 2010-10-08 LAB — BASIC METABOLIC PANEL
BUN: 17 mg/dL (ref 6–23)
GFR calc Af Amer: 58 mL/min — ABNORMAL LOW (ref >60)
GFR calc non Af Amer: 48 mL/min — ABNORMAL LOW (ref >60)
Potassium: 3.4 mEq/L — ABNORMAL LOW (ref 3.5–5.1)

## 2010-10-08 LAB — DIFFERENTIAL
Lymphocytes Relative: 20 % (ref 12–46)
Lymphs Abs: 1.1 10*3/uL (ref 0.7–4.0)
Monocytes Relative: 11 % (ref 3–12)
Neutrophils Relative %: 67 % (ref 43–77)

## 2010-10-08 LAB — CBC
HCT: 39.3 % (ref 36.0–46.0)
Platelets: 74 10*3/uL — ABNORMAL LOW (ref 150–400)
RBC: 4.17 MIL/uL (ref 3.87–5.11)
WBC: 5.5 10*3/uL (ref 4.0–10.5)

## 2010-10-10 LAB — COMPREHENSIVE METABOLIC PANEL
ALT: 45 U/L — ABNORMAL HIGH (ref 0–35)
AST: 61 U/L — ABNORMAL HIGH (ref 0–37)
CO2: 26 mEq/L (ref 19–32)
Calcium: 9.1 mg/dL (ref 8.4–10.5)
Chloride: 108 mEq/L (ref 96–112)
GFR calc Af Amer: >60 mL/min (ref >60)
GFR calc non Af Amer: 52 mL/min — ABNORMAL LOW (ref >60)
Potassium: 3.7 mEq/L (ref 3.5–5.1)
Sodium: 141 mEq/L (ref 135–145)
Total Bilirubin: 1.3 mg/dL — ABNORMAL HIGH (ref 0.3–1.2)

## 2010-10-10 LAB — URINALYSIS, ROUTINE W REFLEX MICROSCOPIC
Glucose, UA: NEGATIVE mg/dL
Leukocytes, UA: NEGATIVE
pH: 6 (ref 5.0–8.0)

## 2010-10-10 LAB — AMMONIA: Ammonia: 20 umol/L (ref 11–35)

## 2010-10-10 LAB — DIFFERENTIAL
Eosinophils Absolute: 0.1 10*3/uL (ref 0.0–0.7)
Eosinophils Relative: 2 % (ref 0–5)
Lymphs Abs: 0.8 10*3/uL (ref 0.7–4.0)

## 2010-10-10 LAB — URINE MICROSCOPIC-ADD ON

## 2010-10-10 LAB — CBC
RBC: 4.28 MIL/uL (ref 3.87–5.11)
WBC: 4.8 10*3/uL (ref 4.0–10.5)

## 2010-10-13 LAB — CBC
HCT: 37.2 % (ref 36.0–46.0)
HCT: 38.8 % (ref 36.0–46.0)
HCT: 40.8 % (ref 36.0–46.0)
HCT: 42.4 % (ref 36.0–46.0)
Hemoglobin: 13 g/dL (ref 12.0–15.0)
MCHC: 34.4 g/dL (ref 30.0–36.0)
MCHC: 34.9 g/dL (ref 30.0–36.0)
MCHC: 35.1 g/dL (ref 30.0–36.0)
MCV: 93.9 fL (ref 78.0–100.0)
MCV: 94.2 fL (ref 78.0–100.0)
MCV: 94.3 fL (ref 78.0–100.0)
MCV: 95.2 fL (ref 78.0–100.0)
Platelets: 73 10*3/uL — ABNORMAL LOW (ref 150–400)
Platelets: 75 10*3/uL — ABNORMAL LOW (ref 150–400)
Platelets: 84 10*3/uL — ABNORMAL LOW (ref 150–400)
Platelets: 85 10*3/uL — ABNORMAL LOW (ref 150–400)
RBC: 3.96 MIL/uL (ref 3.87–5.11)
RBC: 4.11 MIL/uL (ref 3.87–5.11)
RBC: 4.28 MIL/uL (ref 3.87–5.11)
RDW: 13.1 % (ref 11.5–15.5)
RDW: 13.3 % (ref 11.5–15.5)
WBC: 4.6 10*3/uL (ref 4.0–10.5)
WBC: 7.5 10*3/uL (ref 4.0–10.5)
WBC: 7.6 10*3/uL (ref 4.0–10.5)

## 2010-10-13 LAB — COMPREHENSIVE METABOLIC PANEL
ALT: 28 U/L (ref 0–35)
ALT: 29 U/L (ref 0–35)
ALT: 33 U/L (ref 0–35)
ALT: 35 U/L (ref 0–35)
AST: 46 U/L — ABNORMAL HIGH (ref 0–37)
AST: 47 U/L — ABNORMAL HIGH (ref 0–37)
AST: 52 U/L — ABNORMAL HIGH (ref 0–37)
AST: 53 U/L — ABNORMAL HIGH (ref 0–37)
Albumin: 2.6 g/dL — ABNORMAL LOW (ref 3.5–5.2)
Albumin: 2.6 g/dL — ABNORMAL LOW (ref 3.5–5.2)
Albumin: 3 g/dL — ABNORMAL LOW (ref 3.5–5.2)
Albumin: 3 g/dL — ABNORMAL LOW (ref 3.5–5.2)
Alkaline Phosphatase: 76 U/L (ref 39–117)
Alkaline Phosphatase: 92 U/L (ref 39–117)
BUN: 14 mg/dL (ref 6–23)
CO2: 26 mEq/L (ref 19–32)
CO2: 26 mEq/L (ref 19–32)
CO2: 27 mEq/L (ref 19–32)
CO2: 32 mEq/L (ref 19–32)
Calcium: 8.7 mg/dL (ref 8.4–10.5)
Calcium: 9 mg/dL (ref 8.4–10.5)
Chloride: 107 mEq/L (ref 96–112)
Chloride: 111 mEq/L (ref 96–112)
Chloride: 111 mEq/L (ref 96–112)
Creatinine, Ser: 1.33 mg/dL — ABNORMAL HIGH (ref 0.4–1.2)
Creatinine, Ser: 1.4 mg/dL — ABNORMAL HIGH (ref 0.4–1.2)
GFR calc Af Amer: 43 mL/min — ABNORMAL LOW (ref >60)
GFR calc Af Amer: 46 mL/min — ABNORMAL LOW (ref >60)
GFR calc Af Amer: 58 mL/min — ABNORMAL LOW (ref >60)
GFR calc non Af Amer: 32 mL/min — ABNORMAL LOW (ref >60)
GFR calc non Af Amer: 38 mL/min — ABNORMAL LOW (ref >60)
GFR calc non Af Amer: 48 mL/min — ABNORMAL LOW (ref >60)
Glucose, Bld: 146 mg/dL — ABNORMAL HIGH (ref 70–99)
Potassium: 3.7 mEq/L (ref 3.5–5.1)
Potassium: 3.9 mEq/L (ref 3.5–5.1)
Potassium: 4.2 mEq/L (ref 3.5–5.1)
Sodium: 143 mEq/L (ref 135–145)
Sodium: 143 mEq/L (ref 135–145)
Total Bilirubin: 0.8 mg/dL (ref 0.3–1.2)
Total Bilirubin: 1 mg/dL (ref 0.3–1.2)
Total Bilirubin: 1.2 mg/dL (ref 0.3–1.2)
Total Protein: 5.6 g/dL — ABNORMAL LOW (ref 6.0–8.3)
Total Protein: 6.7 g/dL (ref 6.0–8.3)

## 2010-10-13 LAB — URINE CULTURE: Colony Count: NO GROWTH

## 2010-10-13 LAB — DIFFERENTIAL
Basophils Absolute: 0 10*3/uL (ref 0.0–0.1)
Basophils Absolute: 0 10*3/uL (ref 0.0–0.1)
Basophils Relative: 0 % (ref 0–1)
Basophils Relative: 0 % (ref 0–1)
Basophils Relative: 0 % (ref 0–1)
Basophils Relative: 1 % (ref 0–1)
Eosinophils Absolute: 0 10*3/uL (ref 0.0–0.7)
Eosinophils Absolute: 0 10*3/uL (ref 0.0–0.7)
Eosinophils Absolute: 0.1 10*3/uL (ref 0.0–0.7)
Eosinophils Absolute: 0.1 10*3/uL (ref 0.0–0.7)
Eosinophils Absolute: 0.2 10*3/uL (ref 0.0–0.7)
Eosinophils Relative: 0 % (ref 0–5)
Eosinophils Relative: 0 % (ref 0–5)
Eosinophils Relative: 2 % (ref 0–5)
Eosinophils Relative: 3 % (ref 0–5)
Eosinophils Relative: 3 % (ref 0–5)
Lymphocytes Relative: 18 % (ref 12–46)
Lymphocytes Relative: 8 % — ABNORMAL LOW (ref 12–46)
Lymphs Abs: 0.6 10*3/uL — ABNORMAL LOW (ref 0.7–4.0)
Lymphs Abs: 0.8 10*3/uL (ref 0.7–4.0)
Lymphs Abs: 1 10*3/uL (ref 0.7–4.0)
Monocytes Absolute: 0.2 10*3/uL (ref 0.1–1.0)
Monocytes Absolute: 0.5 10*3/uL (ref 0.1–1.0)
Monocytes Relative: 10 % (ref 3–12)
Monocytes Relative: 11 % (ref 3–12)
Monocytes Relative: 3 % (ref 3–12)
Monocytes Relative: 8 % (ref 3–12)
Neutro Abs: 6.7 10*3/uL (ref 1.7–7.7)
Neutrophils Relative %: 65 % (ref 43–77)
Neutrophils Relative %: 81 % — ABNORMAL HIGH (ref 43–77)
Neutrophils Relative %: 90 % — ABNORMAL HIGH (ref 43–77)

## 2010-10-13 LAB — BASIC METABOLIC PANEL
BUN: 18 mg/dL (ref 6–23)
CO2: 27 mEq/L (ref 19–32)
Chloride: 110 mEq/L (ref 96–112)
Creatinine, Ser: 1.21 mg/dL — ABNORMAL HIGH (ref 0.4–1.2)
Potassium: 3.9 mEq/L (ref 3.5–5.1)

## 2010-10-13 LAB — URINALYSIS, ROUTINE W REFLEX MICROSCOPIC
Bilirubin Urine: NEGATIVE
Ketones, ur: NEGATIVE mg/dL
Leukocytes, UA: NEGATIVE
Nitrite: NEGATIVE
Specific Gravity, Urine: 1.01 (ref 1.005–1.030)
Urobilinogen, UA: 1 mg/dL (ref 0.0–1.0)
pH: 7 (ref 5.0–8.0)

## 2010-10-13 LAB — TSH: TSH: 2.216 u[IU]/mL (ref 0.350–4.500)

## 2010-10-13 LAB — URINE MICROSCOPIC-ADD ON

## 2010-10-13 LAB — CK TOTAL AND CKMB (NOT AT ARMC): Relative Index: 1.6 (ref 0.0–2.5)

## 2010-10-13 LAB — AMMONIA: Ammonia: 30 umol/L (ref 11–35)

## 2010-10-13 LAB — AFP TUMOR MARKER: AFP-Tumor Marker: 10.2 ng/mL — ABNORMAL HIGH (ref 0.0–8.0)

## 2010-10-13 LAB — TROPONIN I: Troponin I: 0.04 ng/mL (ref 0.00–0.06)

## 2010-11-19 NOTE — Group Therapy Note (Signed)
[name], [name]               ACCOUNT NO.:  [id number]   MEDICAL RECORD NO.:  [id number]          PATIENT TYPE:  INP   LOCATION:  A313                          FACILITY:  [facility]   PHYSICIAN:  Melissa L. Ladona Ridgel, MD  DATE OF BIRTH:  [birth year]   DATE OF PROCEDURE:  01/26/2009  DATE OF DISCHARGE:                                 PROGRESS NOTE   Please note this patient was seen in conjunction with our physician's  assistant.  Please see the cumulative documentation including written  note and dictated in summary.   The patient states that she had a formed bowel movement today.  She does  still have a little bit of vomiting with eating.  Otherwise she is  slightly confused and thinks she is in an apartment.  She does accept  reorientation and states that she is in the hospital.  Otherwise she  complains of no pain.  No shortness of breath but does say I am weak.   Temperature is 97.4, blood pressure 136/65, pulse 78, respirations 20.   She has had seven voids and no stools are documented but the patient  states she did have a stool.   PHYSICAL EXAMINATION:  A well-developed, moderately obese white female  in no acute distress.  She is normocephalic, atraumatic.  Pupils equal,  round and reactive to light.  Extraocular muscles intact.  She has  anicteric sclerae.  Examination of the nose reveals septum midline  without any discharge.  Examination of ears reveal no discharge and no  external lesions.  The examination of the mouth reveals the patient has  no oral or lip lesions.  Neck is supple.  There is no JVD.  No lymph  nodes.  No carotid bruits.  Chest is decreased but clear to  auscultation.  There is no rhonchi, rales or wheezes.  Cardiovascular,  there is a regular rate and rhythm.  Positive S1, S2, no S3, S4, no  murmurs, rubs or gallops.  Abdomen is obese, nontender, nondistended  with positive bowel sounds.  There is no hepatosplenomegaly that I can  palpate and she is less  distended than previously.   The extremities show multiple areas of ecchymosis consistent with her  thrombocytopenia.  Strength is probably a 4/5 in all extremities  symmetrical.   Neurologically, cranial nerves II-XII are intact.  The patient is awake,  she is alert.  She is not oriented at all times but can be reoriented.  Plantars are downgoing.  There is no flap.   PERTINENT LABORATORIES:  Barium swallow was completed.  There is a 12.5  mm tablet passed easily however, she has severe diffuse esophageal  dysmotility with poor clearance of the barium and she did have  retrograde peristalsis of the barium during the exam.  She has a  moderate sized esophageal diverticulum.  Her CBC shows a white count of  7.6, hemoglobin 13.6, hematocrit 38.8, platelets of 85.  Her sodium is  142, potassium 2.9, chloride 110, CO2 27, BUN is 18, creatinine 1.21 and  glucose 105.  Her AST is 10.2 which is  mildly elevated.  The patient was  able to walk greater than 100 feet today.   ASSESSMENT/PLAN:  The patient is an 77 year old female presented to the  hospital with weakness, nausea, vomiting and diarrhea.  1. Cryptogenic cirrhosis with thrombocytopenia, loose stool secondary      to lactulose.  Her lactulose been discontinued for this time.  We      will clarify with Dr. Loreta Ave how frequently she should be using this      at home.  Perhaps we can titrate just in the number of stools per      day that she needs to have.  2. Degenerative joint disease involving the knees.  The patient has      had injections by Dr. Hilda Lias and is doing much better with regard      to the pain and dysfunction.  3. Esophageal dysmotility.  The patient does have a recent history of      dilatation of the esophagus by Dr. Karilyn Cota.  The patient will      discharge to home on a soft diet with small frequent feeding.  She      will follow up with Dr. Cira Servant in 2 months.  4. Hypothyroidism.  Will continue her Synthroid.  5.  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  6. Overactive bladder.  Will continue her Detrol.  7. Osteoporosis with chronic pain.  Will continue her Oxy immediate      release.  8. Peripheral neuropathy.  She remains on Lyrica once daily.   DISPOSITION:  At this time in light of the fact that the family has been  unable to locate a facility of their choice that will accept this  patient, the patient will attempt discharge to home today and continue  to modify their home arrangements in an attempt to provide in home care  that is appropriate for this patient.   Total time on this case 40 minutes.      Melissa L. Ladona Ridgel, MD  Electronically Signed     MLT/MEDQ  D:  01/26/2009  T:  01/26/2009  Job:  [id number]

## 2010-11-19 NOTE — Assessment & Plan Note (Signed)
NAMEKAELY, HOLLAN                CHART#:  [id number]   DATE:  02/05/2007                       DOB:  15-Jun-1923   PROBLEM LIST:  1. Cryptogenic cirrhosis.  2. Nausea and anorexia.  3. Peripheral neuropathy.  4. Insomnia.  5. Hypothyroidism.  6. Anxiety.  7. Dementia.  8. Gastroesophageal reflux disease.   SUBJECTIVE:  The patient is an 77 year old female who is seen as a  return patient visit.  She is new patient to me and was previously seen  by Dr. Lionel December.  Ms. Hitson has been seen in our clinic since  1992.  At that time she was complaining of feeling like something was  stuck in her throat.  A barium esophagram showed tertiary contractions  with a mid esophageal diverticulum and a small hiatal hernia as well as  gastroesophageal reflux.  She was sent to [facility] for an evaluation.  Her next followup note was in 2002.  She was seen for cirrhosis.  She  had a CT scan in 2002, because she was complaining of abdominal pain  which revealed evidence of cirrhosis.  Her hepatitis B surface antigen  and hepatitis C antibody were negative.  Her liver enzymes were normal  with an alk phos of 136, AST 36, ALT 32, and a total bilirubin of 0.8.  Her albumin was 3.9.  Also noted on her CT scan in November 2002, was a  left adnexal cystic lesion.  She was scheduled for evaluation of the  left cyst.  Also, because of her nausea and anorexia and early satiety  as well as an episode of vomiting, she had an EGD in December 2002.  The  EGD was normal.  She had alpha-1 antitrypsin levels drawn which are normal in February  2003.  She had a negative ANA, AMA, and ceruloplasmin.  She was noted to  have a mildly elevated ammonia level.  She weighed 149 pounds at that  time.   In November 2003, she had her left tube and ovary removed.  A  liver  biopsy was also obtained.  The tube and ovary were benign.  And, a liver  biopsy showed chronic inflammation and fibrosis.  Her weight loss was  of  concern.  Her weight got down to 134 pounds.  She was also complaining  of constipation.  In April 2005, she again had a esophagram.  The 13-mm barium tablet  initially lodged in the proximal thoracic esophagus which reproduced  discomfort that she was experiencing at home.  The tablet did pass into  the stomach without additional swallows of water.  It also showed a  nonspecific esophageal motility disorder with multiple tertiary  contractions noted and a small hiatal hernia.  She was tried on Cardizem  1 tablet twice daily.  Because of her persistent complaint of feeling  like something was in her throat and hoarseness, she was seen ears,  nose, and throat in September 2005.  She was diagnosed with chronic  reflux and presbyesophagus.  Recommendation was made to increase her  Prevacid to twice daily as well as consider an EGD.  On Cardizem and  twice daily Prevacid, her difficulty swallowing and pain with swallowing  were resolved.  This was noted in November 2005.   In February 2006, she again complained of her throat  burning and feeling  like there was a knot when she swallowed.  She was tried on Carafate,  which did not help, so she was started on Reglan.  The Reglan seemed to  be helping in February 2006.  In March 2006, she discontinued the use of  Cardizem.  She was seen in the office and not complaining of any  difficulty swallowing and her heartburn was controlled.  She was on a  proton pump inhibitor as well as low dose Reglan.  In May 2006, she  again had an upper endoscopy due to nausea, weight loss, and heaving.  She was noted to have a small gastric AVM.  She had no evidence of  esophageal or gastric varices.  As part of her workup for failure to  thrive, she had a CT scan of her head which showed a low attenuated  lesion in the brain stem.  She had a CT scan of the abdomen with  contrast that showed diverticulosis, mild cirrhosis, and minimal  ascites.  Her weight in  June 2006, was 124 pounds.  By October 2006, she  was up to 135 pounds.  In November 2006, she was admitted urosepsis.   She was seen subsequently in 2007, for cirrhosis, nausea, and anorexia.  She was last seen by Dr. Karilyn Cota in January 2008.  Her appetite had  improved.  She gained 11 pounds.  She was on Aricept.  Today, she  complains that her heartburn pills were changed from Prevacid to Nexium  and she is doing better.  She states once a week it is like food does  not want to do down.  Occasionally, she gets choked on white phlegm.  She denies any chest pain or abdominal pain.  She had no diarrhea or  constipation.  Her weight has increased.  She is having a bowel movement  every other day.  She does not have discomfort in her abdomen in  between.  She has been complaining of swelling in her legs.   MEDICATIONS:  1. Detrol 4 mg daily.  2. Potassium chloride 10 mEq b.i.d.  3. Synthroid 75 mcg daily.  4. Triazolam 0.25 mg q.h.s.  5. Fludrocortisone 0.1 mg daily.  6. Hydrocodone/APAP 7.5/650 every 6 hours as needed.  7. ICAPS daily.  8. Calcium daily.  9. Lyrica 100 mg b.i.d.  10.Namenda 10 mg b.i.d.  11.Nexium 40 mg daily.   OBJECTIVE:  VITAL SIGNS:  Weight 151 pounds, height 5 feet 1 inch, BMI  28.5 (overweight), temperature 98.2, blood pressure 120/60, pulse 80.  GENERAL:  She is in no apparent distress.  Alert and interactive and  answers questions appropriately.  HEENT:  Atraumatic, normocephalic.  Pupils equal and reactive to light.  Mouth with no oral lesions.  Posterior pharynx without erythema.  LUNGS:  Clear to auscultation bilaterally.  CARDIOVASCULAR:  Reveals a regular rhythm.  No murmur.  Normal S1 and  S2.  ABDOMEN:  Bowel sounds are present.  Soft, nontender, nondistended.  No  rebound or guarding.  No abdominal bruits or pulsatile masses.  Slightly  obese.  EXTREMITIES:  Without cyanosis, clubbing, or edema.  NEUROLOGIC:  She walks with a walker but is able  to get onto the exam  table with assistance.   ASSESSMENT:  Ms. Borland is a 77 year old female who has  gastroesophageal reflux disease who is fairly well controlled on Nexium.  She has constipation which resolved.  Her difficulty swallowing is  multifactorial.  The majority of  her problem is likely secondary to an  nonspecific esophageal motility disorder.  She has had recent endoscopy  in 2006 which showed no evidence of mass or stricture.   Thank you for allowing me to see [name] in consultation.  My  recommendations follow:   RECOMMENDATIONS:  1. She should continue Nexium daily.  2. If she wants to consider a screening colonoscopy, then she may have      a MiraLax prep.  She is given a sample of MiraLax and will let me      know if the taste of the prep is something that she is willing to      undergo in order to have a colonoscopy.  She has never had a      colonoscopy.  3. If her swallowing difficulties persist, then we could consider EGD      with empiric dilation if dilating has helped in the past or      restarting the Cardizem.  4. She has a return patient visit to see me in 4 months.       Kassie Mends, M.D.  Electronically Signed     SM/MEDQ  D:  02/05/2007  T:  02/05/2007  Job:  [id number]   cc:   Madelin Rear. Sherwood Gambler, MD

## 2010-11-19 NOTE — Consult Note (Signed)
[name], [name]               ACCOUNT NO.:  [id number]   MEDICAL RECORD NO.:  [id number]          PATIENT TYPE:  INP   LOCATION:  A313                          FACILITY:  [facility]   PHYSICIAN:  [name], M.D.      DATE OF BIRTH:  1922-11-10   DATE OF CONSULTATION:  01/22/2009  DATE OF DISCHARGE:                                 CONSULTATION   REFERRING PHYSICIAN:  Derenda [name].   PRIMARY CARE PHYSICIAN:  Dr. Sherwood [name].   REASON FOR CONSULTATION:  Dysphagia and cirrhosis.   HISTORY OF PRESENT ILLNESS:  [name] is an 77 year old female who  was last seen in clinic in August 2008.  She was being seen for  cryptogenic cirrhosis, nausea and anorexia.  She also had  gastroesophageal reflux disease.  She was supposed to follow up in 4  months.  She was admitted on July 18 due to falling and weakness.  They  report that she had several bowel movements a day.  She was having more  than 4 a day but she was lactulose 4 times a day.  Her platelets were  normal and noted to be decreased in 2000 to 135,000.   PAST MEDICAL HISTORY:  1. Insomnia.  2. Hypothyroidism.  3. Anxiety.  4. Dementia.   PAST SURGICAL HISTORY:  1. Appendectomy.  2. Hysterectomy.   ALLERGIES:  1. CODEINE.  2. AMBIEN.  3. PENICILLIN.   MEDICATION LIST:  1. Os-Cal.  2. Cipro.  3. Aricept.  4. Lovenox.  5. Lasix.  6. __________.  7. Levothroid.  8. Depo-Medrol.  9. Toprol-XL.  10.Protonix.  11.K-Dur.  12.Lyrica.  13.Senokot.   FAMILY HISTORY:  She is unable to provide any due to cognitive  impairment.   SOCIAL HISTORY:  She has an attentive family and church family.   REVIEW OF SYSTEMS:  Per the HPI, family or the electronic medical  record.  Her ability to provide a complete review of systems is somewhat  limited.   PHYSICAL EXAMINATION:  VITAL SIGNS:  T-max 98.2, systolic blood pressure  124-83.  GENERAL:  She is in no apparent distress, alert and oriented x4.  HEENT:  Atraumatic,  normocephalic.  Pupils equal and reactive to light.  Mouth  has moist mucosa.  She is currently consuming a bacon, lettuce and  tomato sandwich.  NECK:  Full range of motion.  No lymphadenopathy.LUNGS:  Clear to  auscultation bilaterally.  CARDIOVASCULAR:  Regular rhythm and rate, could not appreciate a  murmur.ABDOMEN:  Bowel sounds present, soft, nontender, nondistended.  No rebound or guarding.  EXTREMITIES:  Trace edema and no cyanosis.  NEUROLOGIC:  No new focal or  neurologic deficits.   LABORATORY DATA:  hemoglobin 13.6, platelets 75.  INR 1.1.  Creatinine  1.01.  UA few bacteria, squamous epithelials, 7-10 wbc's.   RADIOGRAPHIC STUDIES:  Last ultrasound in 2009 shows cirrhosis.  Barium  swallow in 2008 showed a distal tic and stricture.   ASSESSMENT:  [name] is an 77 year old female with dysphagia.  Her  thrombocytopenia prevents having any esophageal dilatation.  She is  currently  able to tolerate a regular diet.   Thank you for allowing me to see [name] in consultation.  My  recommendations are as follows:   RECOMMENDATIONS:  1. Agree with rechecking an ammonia level due to her history of      falling.  Agree with ultrasound.  She needs imaging every year.  2. Alpha-fetoprotein and will check twice a year.  3. Would obtain a barium swallow to assess for esophageal motility or      stricture.  4. Bilateral lower extremity compression devices and discontinue      Lovenox due to her ongoing decreasing      platelet count.  5. Would also stop the Senokot and change the LACTULOSE to b.i.d.  The      goal would be to have 3-4 bowel movements a day.      [name], M.D.  Electronically Signed     SM/MEDQ  D:  01/23/2009  T:  01/23/2009  Job:  [id number]   cc:   [name]. Melissa Gambler, MD  Fax: [phone]

## 2010-11-19 NOTE — H&P (Signed)
NAMENAIOMY, WATTERS NO.:  [id number]   MEDICAL RECORD NO.:  [id number]          PATIENT TYPE:  INP   LOCATION:  A313                          FACILITY:  [facility]   PHYSICIAN:  Arne Cleveland, MD       DATE OF BIRTH:  02/20/1923   DATE OF ADMISSION:  01/21/2009  DATE OF DISCHARGE:  LH                              HISTORY & PHYSICAL   PRIMARY CARE PHYSICIANS:  Dr. Lyman Bishop is her Family Practice physician  and Madelin Rear. Sherwood Gambler, MD is her Internal Medicine physician.   CHIEF COMPLAINT:  The patient was brought in by paramedics with a chief  complaint of weakness.   HISTORY OF PRESENT ILLNESS:  The patient is an 77 year old Caucasian  female who has a history of dementia and was brought into the emergency  room by paramedics.  Apparently according to the family, she had been  getting progressively weaker due to pain in her right knee and swelling  in both of her legs.  The patient states that she was unable to get up  and walk because of her weakness.  Family states that they are not able  to give her more help because of her son having recent stroke and been  hospitalized.  The patient apparently was last seen by her primary care  physician a week ago and at that time was told that if her symptoms  worsen, she should go to the emergency room to be admitted.   PAST MEDICAL HISTORY:  Significant for:  1. Senile dementia.  2. Cirrhosis of liver.  3. Gastroesophageal reflux disease.  4. Hypothyroidism.  5. Psoriasis.  6. Hypertension.  7. Arthritis.   PAST SURGICAL HISTORY:  Significant for appendectomy and hysterectomy.   ALLERGIES:  She is allergic to CODEINE, SULFA, AMBIEN, and PENICILLIN.   MEDICATIONS:  1. Lactulose 10 g 4 times a day.  2. Calcium 500 mg daily.  3. Phenergan 25 mg every 6 hours as needed.  4. Nexium 40 mg daily.  5. Synthroid 75 mcg daily.  6. Aricept 5 mg daily.  7. Lasix 20 mg 3 times a day.  8. Oxycodone 5 four times a day.  9.  Detrol LA 4 mg daily.  10.Lyrica 100 mg daily.  11.Toprol 25 mg daily.  12.Potassium 10 mEq daily.   FAMILY HISTORY:  The patient was not able to provide accurate family  history due to dementia.   REVIEW OF SYSTEMS:  The patient was unable to provide accurate review of  systems secondary to dementia.  According to what notes I could get,  family was not at the bedside, the patient had been getting weaker and  weaker over the last couple of weeks.   SOCIAL HISTORY:  The patient apparently is living alone.  Does not  smoke, drink, or use illicit drugs.   LABORATORY DATA AND X-RAYS:  Sodium, potassium, chloride, bicarb,  glucose, BUN, creatinine, calcium, and total protein.  Only two  abnormalities was that glucose was slightly elevated at 119 mg/dL and  her creatinine was slightly elevated at 1.40.  Albumin was  low at 3  g/dL.  Her AST was elevated at 52, ALT was normal, alkaline phosphatase  was normal, and bilirubin was normal.  Cardiac enzymes were normal.  Ammonium level was 30 mmol/L.  Partial thromboplastin time was normal.  Protime was normal.  Microscopic urinalysis showed 7-10 wbc's per high-  power field, 7-10 rbc's per high-power field, few bacteria, and dipstick  was negative.  White count 5700, hemoglobin 14.9, and hematocrit 42.4%.  She had a chest x-ray, which showed lung volumes with no focal process.  CT of the head showed no acute intracranial abnormality, left knee  showed mild degenerative changes without acute bony abnormality, and  right knee showed some swelling and degenerative changes.   PHYSICAL EXAMINATION:  VITAL SIGNS:  Temperature 98.3, blood pressure  117/43, pulse 72, respirations 16, and O2 sats 93%.  HEENT:  Head is atraumatic and normocephalic.  Eyes, pupils equal,  round, and reactive to light.  Discs sharp.  Extraocular muscles, range  of motion is full.  External ear canal and tympanic membrane appeared  normal.  Oropharynx and nose normal.   NECK:  Supple without jugular venous distention, thyromegaly, or thyroid  mass.  CHEST:  Normal to inspection and palpitation.  LUNGS:  Clear to auscultation and percussion.  HEART:  Regular rhythm and rate.  Normal S1 and S2 without murmur,  gallop, or rub.  ABDOMEN:  Soft and nontender with normoactive bowel sounds.  No  hepatomegaly.  No splenomegaly appreciated.  No ascites appreciated.  PELVIC:  Deferred at the patient's request.  RECTAL:  Stool was Hemoccult negative.  MUSCULOSKELETAL:  Both knees had arthritic changes.  Right knee appear  to be swollen, tender, and had decreased range of motion.  SKIN:  She had several psoriatic plaques, some around nose and around  the buttocks area.  She had multiple hematomas or bruising in the lower  extremities, in the arms and legs.  NEUROLOGY:  Cranial nerves II through XII were intact.  Motor strength,  she had good strength in the upper extremity, 4-5 in the lower  extremity, unable to stand because of generalized weakness.  Sensation,  she withdrew to painful stimuli and fine touch.  Her mental status, the  patient had poor short-term memory and was essentially confused due to  her dementia.  LYMPH NODES:  Cervical, axillary, and inguinal lymph nodes were normal.   IMPRESSION:  1. Urinary tract infection.  2. Generalized weakness.  3. Right knee mild effusion arthritis flare-up.  4. Lower extremity edema, most likely secondary to her cirrhosis and      renal insufficiency as well as poor intake.  5. Possible malnutrition.  She has multiple medical problems as stated      in her past medical history and the patient      is unable to care for herself.  She will need placement and that      will be initiated.  She will be continued on her routine medicines      and started on IV Cipro for urinary tract infection.  This history      and physical was completed.  The patient has been admitted to [facility] here at  [facility].      Arne Cleveland, MD  Electronically Signed     ML/MEDQ  D:  01/21/2009  T:  01/22/2009  Job:  [id number]   cc:   Madelin Rear. Sherwood Gambler, MD  Fax:  [phone] 

## 2010-11-19 NOTE — Discharge Summary (Signed)
[name], [name]               ACCOUNT NO.:  [id number]   MEDICAL RECORD NO.:  [id number]          PATIENT TYPE:  INP   LOCATION:  A313                          FACILITY:  [facility]   PHYSICIAN:  Melissa L. Ladona Ridgel, MD  DATE OF BIRTH:  [birth year]   DATE OF ADMISSION:  01/21/2009  DATE OF DISCHARGE:  07/23/2010LH                               DISCHARGE SUMMARY   DISCHARGE DIAGNOSES:  1. Degenerative joint disease with bilateral knee effusions.  2. Cryptogenic cirrhosis and exacerbation of thrombocytopenia with      platelets at approximately 85,000.  3. Esophageal dysmotility with frequent spitting up.  4. Protein calorie malnutrition.  5. With regards to the rest of the discharge diagnoses, they remained      stable during this hospitalization.  No changes were made.  They      include peripheral neuropathy, osteoporosis with chronic pain,      overactive bladder, organic brain syndrome with some confabulation,      hypothyroidism and history of psoriasis.   CONSULTATIONS:  1. Kassie Mends, M.D., gastroenterology who saw the patient on January 22, 2009 for dysphagia as well as cryptogenic cirrhosis.  She      recommended a barium swallow and reviewed those results.  She also      made recommendations regarding lactulose and Reglan.  The patient      will follow up with Dr. Cira Servant in approximately 2 months in her      office.  2. Orthopaedic, J. Darreld Mclean, M.D. saw the patient on January 23, 2009 for bilateral knee effusions.  He injected her knees      bilaterally with Depo-Medrol.  The patient is doing much better      from this.  She will follow up post hospitalization with Dr.      Hilda Lias.   HISTORY AND BRIEF HOSPITAL COURSE:  [name] is an 77 year old  female who has a history of cryptogenic cirrhosis.  She was brought to  the ED by paramedics with progressively increasing weakness, pain in her  legs bilaterally.  Over the last couple of days she had been unable  to  get up and walk.  She was admitted to [facility] with presumed  urinary tract infection, nausea, vomiting, increased generalized  weakness, knee effusions, lower extremity edema and poor  intake/malnutrition.  With regard to #1, her osteoarthritis flare, Dr.  Hilda Lias saw the patient and injected her knees.  The pain decreased  greatly over the next couple of days.  She was able to get up and walk  more than 100 feet in the hospital with assistance.  With regard to  urinary tract infection, the patient was initially thought to have a  urinary tract infection and was placed on Cipro.  However, her cultures  did not grow any organism, so her antibiotics were stopped.  She did not  seem to have any further urinary tract symptoms in the hospital.  With  regard to cryptogenic cirrhosis, her LFTs remained stable in the  hospital  with a total bilirubin of 1.0, alkaline phosphatase 76, AST 46,  ALT 28, serum albumin 2.6.  However, it was noticed she had a  thrombocytopenia that became more pronounced over this hospitalization.  On discharge, her platelets were 85,000.  This will be monitored ongoing  by Dr. Sherwood Gambler.  With regards to her nausea, vomiting and poor intake, a  barium swallow was ordered.  The patient was found to have esophageal  dysmotility with retrograde peristalsis.  A 12.5 mm tablet passed easily  into her stomach, however the patient had difficulty swallowing barium.  She was also seen by speech therapy and did rather well with their  evaluation.  She was found to have no signs or symptoms of penetration  or aspiration, however speech therapy recommended a dysphagia III  mechanical soft diet with thin liquids and suggested that the patient  take small bites and sips and eat always in an upright posture.   Today on physical exam the patient is alert, oriented and in good  spirits.  She tells me she is ready to go home.  Her head is  normocephalic, atraumatic.  Her  eyes are clear with no icterus.  Her  conjunctivae are pink.  Nose has no nasal discharge.  She has good  dentition with no oral lesions seen on exam.  Her neck is supple with no  JVD apparent.  She has no carotid bruits.  Lungs are clear to  auscultation bilaterally with no wheezes or crackles appreciated.  Heart  has a regular rate and rhythm with no obvious murmurs, rubs or gallops.  Abdomen is obese, but not distended.  It is soft.  It is nontender.  She  has good bowel sounds.  There was no obvious ascites.  Knees have  decreased in size.  She is able to get up and walk now without pain.  She has no lower extremity edema.  She has good lower extremity pulses  bilaterally.   Labs today:  B-Met is within normal limits, specifically with a BUN of  18 and a creatinine mildly elevated at 1.21.  Her CBC is significant for  white count 7.6, hemoglobin 13.6, hematocrit 38.8 and platelet count  85,000.  She did have an AFP while she was here in the hospital that was  mildly elevated at 10.2.  She also had a TSH done here in the hospital,  the result was 4.225.  Ammonia level on January 23, 2009 was 28.  Radiological exams done this admission include a barium swallow which as  noted above the dominant finding of significant diffuse impairment of  esophageal motility with retrograde peristalsis.  She also had a mid  thoracic esophageal diverticulum.  She had lower extremity Doppler  ultrasound of her right leg and there was no evidence of DVT.  She had a  gastric emptying scan that showed delayed gastric emptying with 57% of  the tracer remaining in the stomach after 2 hours.  She had an abdominal  ultrasound done on January 23, 2009 that showed a cirrhotic appearing liver  without discrete mass, atrophic kidneys, no ascites.   DISPOSITION:  The patient will be discharged home today in stable  condition.  She will be under the care of her family.  They also have  hired a daily caretaker to come in  for the patient.  She will be going  home with home health and physical therapy.   DISCHARGE MEDICATIONS:  1. Reglan 5 mg 1 pill  p.o. before breakfast and dinner.  A      prescription has been given for this.  2. Lactulose 10 mL 2 times a day.  We have advised the family to      titrate this dosage to obtain 2-3 stools daily.  3. Calcium 500 mg once daily.  4. Phenergan 25 mg as needed up to 3 times a day.  5. Nexium 40 mg 1 tablet daily.  6. Synthroid 75 mcg 1 tablet daily.  7. Aricept 5 mg 1 tablet daily.  8. Lasix 20 mg 1 tablet daily.  9. Oxycodone 5-15 mg as needed up to 4 times a day.  A prescription      was given for this for 12 pills.  10.Detrol LA 4 mg 1 tablet daily.  11.Lyrica 100 mg 1 tablet daily.  12.Toprol XL 25 mg 1 tablet daily.  13.Potassium 10 mEq 1 tablet daily.   The patient will see Dr. Hilda Lias at [facility] in 2 weeks.  She will call the office to schedule an appointment with Dr. Kassie Mends  at [facility].  The appointment should be in early  September.  The patient's son Mellody Dance who is her primary caretaker knows  to call Dr. Sherwood Gambler with any increase in her occasional confabulation and  confusion or increase in weakness or inability to walk.  45 minutes was  spent on this discharge summary.   Addendum: I have seen examined this patient and reviewed the discharge  plan with our Physician's Assistant.  I agree with above discharge  diagnosis.  I agree with the current exam and would add that she has  multiple areas of ecchymosis but no obvious purpura in her mouth our  lips.  She does have a slight skin tear on her left forearm.  I reviewed  the discharge plan with the patient's son last PM.  Melissa L. Ladona Ridgel,  M.D.      Stephani Police, PA      Melissa L. Ladona Ridgel, MD  Electronically Signed    MLY/MEDQ  D:  01/26/2009  T:  01/26/2009  Job:  [id number]   cc:   Madelin Rear. Sherwood Gambler, MD  Fax: [phone]   J. Darreld Mclean,  M.D.  Fax: [phone]   Kassie Mends, M.D.  [address]  [place] , [place] [address]

## 2010-11-19 NOTE — Consult Note (Signed)
[name], [name]               ACCOUNT NO.:  [id number]   MEDICAL RECORD NO.:  [id number]          PATIENT TYPE:  INP   LOCATION:  A313                          FACILITY:  [facility]   PHYSICIAN:  J. Darreld Mclean, M.D. DATE OF BIRTH:  02/21/1923   DATE OF CONSULTATION:  DATE OF DISCHARGE:                                 CONSULTATION   The patient needs request of the hospitalist.   The patient is well known to me.  She is an 77 year old female who is  supposed to have been seen in my office yesterday for ongoing evaluation  of degenerative joint disease of her knees, particularly on the left  knee.  She has long-standing history of degenerative joint disease of  the knees, and she is complaining of pain today at both the right and  left knee.  She has mild effusion to both knees.  She has crepitus,  decreased range of motion, and pain.  I reviewed her history and  physical.  She is in the hospital for GI problems.  I have reviewed the  notes in the chart and there are incorporated by reference, I will not  go through them began.  She is nauseous today and is uncomfortable.  Both knees are tender.  Both knees have mild pain and effusion,  decreased motion.  Neurovascular is intact.   I injected both knees, left and right, with Depo-Medrol 40, 1 mL, in 1%  Xylocaine.  When injected the right knee, she is complaining of pain in  the right knee today as well.  Left knee was more painful.  X-rays of  the left knee showed DJD and otherwise negative.  No fracture or loose  body.   IMPRESSION:  Bilateral degenerative joint disease of the knees, left  greater than right.   I will see her in the office after discharge.  If any difficulty or  problem to let me know and reconsult if necessary.           ______________________________  Shela Commons. Darreld Mclean, M.D.     JWK/MEDQ  D:  01/23/2009  T:  01/23/2009  Job:  [id number]

## 2010-11-19 NOTE — Group Therapy Note (Signed)
[name], [name]               ACCOUNT NO.:  [id number]   MEDICAL RECORD NO.:  [id number]          PATIENT TYPE:  INP   LOCATION:  A313                          FACILITY:  [facility]   PHYSICIAN:  Melissa L. Ladona Ridgel, MD  DATE OF BIRTH:  [birth year]   DATE OF PROCEDURE:  01/22/2009  DATE OF DISCHARGE:                                 PROGRESS NOTE   SUBJECTIVE:  I met with the patient, the patient's son and daughter-in-  law.  The patient was admitted last evening with urinary tract infection  which presented as weakness, nausea, and vomiting.  The patient  according to the patient's son was scheduled to see Dr. Hilda Lias today  for her right knee pain and swelling.  He also informs me that she was  supposed to have an EGD in the short-stay area tomorrow.  The patient's  son shares with me there is something quite wrong as he is her primary  care giver, and over the last couple of days, she has had a rapid  decline with regard to ability to walk, pain in her leg, nausea,  vomiting which has been persistent.  He shares with me her past medical  history of cryptogenic cirrhosis and feels that there is increasing  abdominal swelling possibly secondary to her liver problems.   I have reviewed the patient's history and physical, current laboratories  and medication list as well as her current vital signs.   PHYSICAL EXAMINATION:  Temperature 98.2, blood pressure 124/48, pulse  74, respirations 18, saturation 94%.  GENERAL PHYSICAL EXAM:  The patient is awake, alert.  She has very poor  short-term memory, but she is able to identify her son, Mellody Dance, and her  daughter-in-law.  She otherwise is normocephalic, atraumatic.  Pupils equal, round and  reactive to light.  Extraocular muscles appear to be intact.  She has  anicteric sclerae.  Examination of the nose reveals septum midline  without discharge.  Examination of the mouth reveals no oral lesions or  lip lesions.  NECK:  Is supple.  I do not  appreciate any JVD.  No lymph nodes.  No  carotid bruits.  Trachea is in the midline.  There is no thyromegaly.  CHEST:  Decreased but clear to auscultation.  There is no rhonchi, rales  or wheezes.  CARDIOVASCULAR:  Regular rate and rhythm.  Positive S1-S2.  No S3, murmurs, rubs or gallops.  I do not appreciate a heave or thrill.  Apical impulse is nondisplaced.  ABDOMEN:  Is firm, seemingly obese, nondistended but tender in the left  lower quadrant area which may be secondary to a full bladder.  LOWER EXTREMITIES:  She has tenderness up and down the right leg and  shin as well as the right knee which has an effusion.  She does appear  to bruises anteriorly on both knees which the son relates is not  secondary to a fall.  However, it is quite suspicious for that.  There  does not appear to be excessive erythema related to the swelling at the  right lateral patellar area. The patient  also has a corn-like lesion on  the left first great phalanx which is slightly erythematous and has a  significant callus.  There is no expressible purulent material at that  site.  NEUROLOGICAL:  The patient is awake, alert, oriented.  Cranial nerves II-  XII are intact.  She has no flap related to her hepatic disease.  She  has grip strengths which are equal.  Power is 5/5 in the upper  extremities.  Lower extremities limited by pain.  DTRs are 2 in the  upper extremities.  It is difficult to assess the lower extremities  secondary pain.  Sensation appears to be grossly intact.  PSYCHIATRIC:  Again, short-term memory is quite impaired.  Otherwise  judgment and insight appear to be reasonable.  However, I do suspect  that they are impacted by her short-term memory deficits.   PERTINENT A.M LABORATORY DATA:  Sodium is 144, potassium 3.7, chloride  111, CO2 26, BUN 15, creatinine 1.09, glucose 103.  AST is 47, ALT is  29, albumin 2.6, calcium 8.6, total bilirubin 1.2.  Her ammonia level  this morning was 30.   First set of cardiac markers is negative.   ASSESSMENT AND PLAN:  This is an 77 year old white female admitted with  a urinary tract infection, weakness, and knee pain who has underlying  cryptogenic cirrhosis.  This morning, she has persistent nausea and  vomiting and is unable to keep her pills down.  She was scheduled for  EGD in the morning.  The patient's son shares with me that this is a new  change for his mother, and he is very concerned about the last 4 to 5  days of increasing debility.   1. Cryptogenic cirrhosis.  Her ammonia level at this time appears to      be controlled.  Will continue the lactulose at her current levels.      I have asked Dr. Kassie Mends to evaluate this patient.  I will      obtain an abdominal ultrasound to access for ascites and will      switch her oral Lasix to IV Lasix in order to diurese her more      effectively since she is not able to keep her oral medications      down.  2. 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  The patient was seen in 2008 by Dr. Cira Servant      for this and will be evaluated during this stay for the impact that      recurrent diverticulum or other esophageal conditions may have on      her protracted nausea and vomiting.  3. Hiatal hernia.  Again, I will put her on Protonix IV b.i.d., and      she will be having a GI consult.  4. Overactive bladder.  She is on Detrol.  I will try to see if she      can keep that pill down.  I will also ask them to bladder scan her      to see if she is actually retaining significant amount of urine at      which time, if she is, we will place a Foley catheter  5. Osteoporosis.  The patient remains on calcium supplementation.  She      may not be able to tolerate that right now, but we will resume that      when she if it  needs to be held.  6. Peripheral neuropathy.  The patient is on Lyrica.  I will continue      that if she is able to keep that  tablet down.  7. Chronic pain.  I will convert her to OxyIR since she has been      nauseated on the tablets and throwing up of her pills.  We can use      IV pain medication, but I would prefer at this point to try to use      oral medications since she has tolerated them well at home.  If she      is unable to keep that down, then we will go to IV medications.  8. Hypothyroidism.  We will continue her Synthroid.  Again, this is a      small tablet.  Likely would be able to keep it down so we will      continue that in the pill form.  9. Organic brain syndrome.  I will obtain office records from Dr.      Sharyon Medicus office.  10.Right knee pain.  I have consulted Dr. Hilda Lias who is planning to      inject her knee and will also check an ultrasound of the lower      extremities since her legs are quite tender.   Total time on this case was 40 minutes.  I reviewed the case with Dr.  Hilda Lias and Dr. Cira Servant.      Melissa L. Ladona Ridgel, MD  Electronically Signed     MLT/MEDQ  D:  01/22/2009  T:  01/22/2009  Job:  [id number]

## 2010-11-19 NOTE — Group Therapy Note (Signed)
[name], [name]               ACCOUNT NO.:  [id number]   MEDICAL RECORD NO.:  [id number]          PATIENT TYPE:  INP   LOCATION:  A313                          FACILITY:  [facility]   PHYSICIAN:  Melissa L. Ladona Ridgel, MD  DATE OF BIRTH:  1923/01/05   DATE OF PROCEDURE:  01/23/2009  DATE OF DISCHARGE:                                 PROGRESS NOTE   The patient was seen in conjunction with our physician's assistant.  Please see the accompanying note.   SUBJECTIVE:  The patient stated that she was feeling somewhat better  today.  However, while we were interviewing her, she vomited a small  amount of her spaghetti with a little bit of bright red blood.  This was  witnessed by her physician's assistant.  The patient subsequently felt a  little bit better.  She is still complaining of pain in her left knee  and her shin.  She also is reported by her family to be somewhat  confused and paranoid. She is stating that someone was breaking into the  house, and after speaking with her son, she also was concerned about  babies that were not present in the room.  I appreciated Dr. Sanjuan Dame  visit and note.  The patient had the bilateral injections of her knees,  and I do feel that there is probably some improvement, as I am able to  touch the patient's right knee without her feeling uncomfortable the way  she did yesterday.  She really jumped when I touched that.   Today her T-max was 98.2.  Blood pressure 144/75, pulse 82, respirations  18, saturations had been running 90 to 94%, generally on room air.   Her ins and outs from yesterday are incompletely recorded. She had 350  of urine out, plus one incontinent episode and no stools as of  yesterday.   PHYSICAL EXAMINATION:  GENERAL PHYSICAL EXAM:  She is a well-nourished  white female who in general confabulates and does not appear to be in  acute distress.  She is, however, slightly confused and will on occasion  make up answers to questions,  but she is pleasant.  HEAD:  She is normocephalic, atraumatic.  EYES:  Pupils equal and reactive to light.  Extraocular muscles intact.  She has anicteric sclerae.  EARS:  Examination of the external ears reveals no lesions or discharge.  NOSE:  Examination of the external nose reveals no lesions or discharge.  MOUTH:  Examination of mouth reveals no oral lesions or lip lesions.  NECK:  Examination of neck reveals no JVD, lymphadenopathy and no  carotid bruits.  Trachea is in the midline.  CHEST:  Examination of  chest reveals decreased bilaterally without rhonchi, rales or wheezes.  CARDIOVASCULAR:  Regular rhythm.  Positive for S1 S2;  no S3-4, murmurs,  rubs or gallops.  ABDOMEN: Is soft, mildly distended, and nontender. Actually I think that  her abdomen is less distended than yesterday.  The nurse-practitioner  was able to elicit some minor tenderness in the right upper quadrant,  which with deep palpation, I would agree she  is slightly tender over the  distribution of the liver, which does not surprise me in light of her  cirrhosis.  EXTREMITIES:  She still has some tenderness in the right knee, but it is  not quite as tender as yesterday, where she really would flinch and call  out. Today it is much less tender. Still I do not see any erythema.  There is still a slight swelling in the right lateral aspect of the  knee. Lower extremities bilaterally have 1 to 2+ edema on the right and  1+ edema on the left.  NEUROLOGICALLY:  The patient is awake and alert, oriented to herself and  general events. She can bring out some old information, like the  location of Dr. Sanjuan Dame office, but she has trouble with her short-  term memory.  PSYCHIATRICALLY:  Affect is appropriate.  Recent and remote memory are  intact.  Judgment and insight are not completely intact because of her  short-term memory deficit.   LABORATORY DATA:  Pertinent laboratories today reveal TSH of 4.225.  Her  urine  culture is actually no growth.  Her sodium is 143, potassium 2.9,  chloride 107, CO2 27, BUN is 8,  creatinine 1.53.  Her ammonia level  today is 28.  Her cardiac markers have been negative.   Radiological studies for today:  Venous Doppler of the lower extremity  shows no evidence of DVT on the right lower extremity. The patient  underwent a gastric emptying study, which she was scheduled for as an  outpatient.  The patient was found to have significantly delayed gastric  emptying, with 43% of the tracer being emptied at 2 hours. Ultrasound of  the abdomen revealed a poorly visualized pancreas because of the body  habitus and bowel gas pattern.  The spleen was normal, with no focal  masses. Please note that there was a question of whether there was some  splenomegaly on her acute abdominal series yesterday, but the ultrasound  is showing a normal size spleen. Gallbladder is normal in appearance.  There is no Murphy's sign or pericholecystic fluid. Kidneys showed no  masses or hydronephrosis.  There did not appear to be any free fluid at  all in her abdomen..   ASSESSMENT/PLAN:  1. This is an 77 year old white female who was admitted with a      presumed urinary tract infection, based on her urinalysis      yesterday. She has been treated with Cipro. Her urine culture today      shows no obvious growth.  In light of the fact that she has no      fever, I will discontinue her Cipro, as it may be contributing to      some of her confusion.  2. Cryptogenic cirrhosis with decreased platelets.  Her platelets      yesterday were 75.  Unfortunately I do not see a CBC for today, and      we definitely will recheck that this evening.  We have discontinued      her Lovenox and will continue to follow very closely.  3. Right greater than left knee pain.  Dr. Hilda Lias came by to inject      her.  We will see how she does with that and I will go ahead and      order a physical therapy consult.  4.  Dysphagia with chronic vomiting.  She had a little bit of blood      with  her vomitus today, according to our physician's assistant. The      patient is scheduled for barium swallow in the morning.  We will      continue to monitor her closely for that. The patient is off of      Lovenox.  5. Hypothyroidism.  Continue her Synthroid as scheduled.  6. Organic brain syndrome. At this time I feel there is an      exacerbation in her condition secondary to (1) change in the      environment, and (2) perhaps can be attributed to Cipro, which has      been discontinued, and we will follow.   Total time between  physician's assistant and this physician was a total  of 45 minutes.      Melissa L. Ladona Ridgel, MD  Electronically Signed     MLT/MEDQ  D:  01/23/2009  T:  01/23/2009  Job:  [id number]

## 2010-11-19 NOTE — Group Therapy Note (Signed)
[name], [name]               ACCOUNT NO.:  [id number]   MEDICAL RECORD NO.:  [id number]          PATIENT TYPE:  INP   LOCATION:  A313                          FACILITY:  [facility]   PHYSICIAN:  Melissa L. Ladona Ridgel, MD  DATE OF BIRTH:  1923/03/30   DATE OF PROCEDURE:  01/24/2009  DATE OF DISCHARGE:                                 PROGRESS NOTE   The patient was seen in conjunction with our physician's assistant.    Subjectively, the patient is lying in bed.  She appears much more calm  and less confused compared to yesterday.  She states she is feeling much  better and her family confirms that.  She states today she had diarrhea  which precluded her from going down for her barium swallow.  She denies  any chest pain or shortness of breath.  She denied any abdominal pain,  but states she still feels nauseated.  She could not recall she had  thrown up at all.   PHYSICAL EXAMINATION:  VITAL SIGNS:  Temperature was 98.1, blood  pressure 109/55, pulse 84, and respirations 18.  GENERAL:  This is a moderately obese white female in no acute distress.  She is normocephalic and atraumatic.  Pupils are equal, round, and  reactive to light.  Extraocular muscles are intact.  She has anicteric  sclerae.  Examination of nose reveals septum midline without discharge.  Examination of mouth reveals a dentures with no oral lesions.  NECK:  Supple.  I do not appreciate any JVD.  CHEST:  Decreased, but clear to auscultation.  There is no rhonchi,  rales, or wheezes.  CARDIOVASCULAR:  Regular rate and rhythm.  Positive S1, S2, S3, S4.  No  murmurs, rubs, or gallops.  ABDOMEN:  Obese, nontender, and nondistended with positive bowel sounds.  No hepatosplenomegaly.  No guarding or rebound.  No bruits are noted.  EXTREMITIES:  Her knees are less tender.  She has a little bit of  effusion on the right knee.  She is able to bend and does not move this  without difficulty today, and she states she ambulated up  the hallway  today with the physical therapist.  PSYCHIATRIC:  She seems more oriented to time, place, and person.   PERTINENT LABORATORIES:  Her alpha fetoprotein is elevated at 10.2.  Her  sodium is 143, potassium 4.2, chloride 111, CO2 26, BUN 14, creatinine  1.33 and glucose 146.  White count is 7.5 with a hemoglobin of 13,  hematocrit of 37.2, and platelets of 73, which is slightly decreased  from her platelet count of 83.   ASSESSMENT AND PLAN:  An 77 year old white female admitted with presumed  urinary tract infection based on urinalysis who also complained of  generalized weakness, right greater than left knee pain, and persistent  nausea with vomiting, and inability keeping food down.  1. Cryptogenic cirrhosis, now with a documented elevated alpha-      fetoprotein. I appreciate Dr. Cira Servant' assistance with this patient's      care.  The patient will have her lactulose further decreased and/or  discontinued because of increased diarrhea.  I will talk to Dr.      Cira Servant regarding her elevation in her alpha-fetoprotein.  2. Dysphagia with chronic vomiting.  The patient will go for a barium      swallow tomorrow, unfortunately today she was having copious      amounts of diarrhea and refused to go for that test.  In response      to the diarrhea, she will have her lactulose stopped.  If diarrhea      continues, we will check for C. diff colitis, but at this time she      has no fever.  Her white count has suggested that she is infected.  3. Right greater than left knee pain, seemingly resolved after a      steroid injection in her knees and we will continue her to physical      therapy.  4. Hypothyroidism.  We will continue her Synthroid.  5. Organic brain syndrome with exacerbation.  The patient seems much      improved compared to yesterday when she was slightly paranoid.  I      suspect there may be an element of adverse response to the Cipro,      which is the only drug  that I did change.  We will continue to      monitor her for that.   DISPOSITION:  The patient is scheduled to go for skilled nursing  facility placement, once she is medically cleared.  Total time in this  case is 35 minutes.      Melissa L. Ladona Ridgel, MD  Electronically Signed     MLT/MEDQ  D:  01/25/2009  T:  01/25/2009  Job:  [id number]

## 2010-11-22 NOTE — Discharge Summary (Signed)
[name], [name]               ACCOUNT NO.:  [id number]   MEDICAL RECORD NO.:  [id number]          PATIENT TYPE:  INP   LOCATION:  A318                          FACILITY:  [facility]   PHYSICIAN:  [name], M.D.    DATE OF BIRTH:  01/29/1923   DATE OF ADMISSION:  11/07/2004  DATE OF DISCHARGE:  05/11/2006LH                                 DISCHARGE SUMMARY   DISCHARGE DIAGNOSES:  1.  Anorexia and early satiety, no clear etiology determined despite      extensive work up.  2.  Mild dementia.  3.  Mild diabetes with associated neuropathy and chronic pain.  4.  Compensative hypothyroidism.  5.  Organic brain syndrome, mild.  6.  Severe gastroesophageal reflux disease.  7.  Cryptogenic cirrhosis documented by liver biopsy 2003.  8.  Status post hysterectomy and oophorectomy with hydro salpingo being the      ultimate diagnosis, no history of ovarian cancer.  9.  History of urinary incontinence.  10. Osteoporosis.   For details regarding admission, please refer to the admission note.  Briefly, this 77 year old female with the above history presented to the  emergency department with a several month history of increasingly severe  early satiety. She reports a 30 to 40 pound weight loss over the past 6  months, though Dr. Inge Rise records confirmed approximately a 10 pound weight  loss during this time. Her physical examination was unremarkable. Laboratory  unremarkable except for a serum ammonia of 49. Liver functions were normal  as was hemogram and chemistries. She did have 3 to 5 white cells in her  urine. Aside from chronic constipation, the patient denied any other GI  symptomatology. She has ongoing nausea, unchanged in intensity. She was  admitted with significant weight loss as well as early satiety, etc.   HOSPITAL COURSE:  The patient was continued on her home medications that  will be noted below. She was seen by Dr. Dionicia Abler in consultation. Upper  endoscopy was relatively  unremarkable. A CT of her abdomen showed mild  cirrhosis and mild perihepatic ascites. CT pelvis revealed mild sigmoid  diverticulosis. Chest x-ray was unremarkable. CT of her head revealed subtle  low attenuation within the brain stem. MRI recommended, no abnormality  discovered on the MRI. The patient's appetite has improved somewhat. She is  obviously somewhat depressed and Paxil was begun approximately 4 to 5 days  ago. Calorie count was obtained, which revealed approximate 500 to 600  calorie intake per day. However, this has improved as over the past 24  hours. Ensure plus was taken x3 yesterday without difficulty. Currently, she  is alert and requesting discharge. She does have home help, social services  will arrange for further help as needed.   DISPOSITION:  Medications include Paxil 10 mg q.d., Aricept 5 q.d., calcium  with D, Restoril 30 q.h.s. p.r.n., Synthroid 75 mcg q.d., Detrol LA 4 mg  q.d., Prevacid 30 mg q.d., Neurontin 200 mg b.i.d., Glucotrol 2.5 mg q.d.  and Reglan 5 t.i.d. Home health will assist in following the patient and she  will be  treated expectantly.      MC/MEDQ  D:  11/14/2004  T:  11/14/2004  Job:  [id number]

## 2010-11-22 NOTE — Op Note (Signed)
NAMESARAFINA, PUTHOFF                         ACCOUNT NO.:  [id number]   MEDICAL RECORD NO.:  [id number]                   PATIENT TYPE:  AMB   LOCATION:  DAY                                  FACILITY:  [facility]   PHYSICIAN:  Lazaro Arms, M.D.                DATE OF BIRTH:  09/13/1922   DATE OF PROCEDURE:  05/26/2002  DATE OF DISCHARGE:                                 OPERATIVE REPORT   PREOPERATIVE DIAGNOSES:  1. Left adnexal mass.  2. Left hydrosalpinx.  3. Macronodular cirrhosis.   POSTOPERATIVE DIAGNOSES:  1. Left adnexal mass.  2. Left hydrosalpinx.  3. Macronodular cirrhosis.   PROCEDURE:  1. Laparoscopic left salpingo-oophorectomy performed by Dr. Despina Hidden.  2. Laparoscopically-directed liver biopsy performed by Dr. Malvin Johns.   SURGEON:  As above.   ANESTHESIA:  General endotracheal.   FINDINGS:  The patient had an enlarged left adnexa.  It appeared to be  benign.  It was a left hydrosalpinx which was the thought preoperatively.  The rest of the pelvis was normal.  The liver had macronodular cirrhosis.  Pictures were taken.  Dr. Lionel December looked at the liver  intraoperatively, and Dr. Malvin Johns did a biopsy without difficulty.   DESCRIPTION OF OPERATION:  The patient was taken to the operating room and  placed in the supine position where she underwent general endotracheal  anesthesia.  A Foley catheter was placed.  Her abdomen was prepped and  draped in the usual sterile fashion.   An incision was made in the umbilicus, and a nonbladed 10-11 obturator was  placed under direct visualization without difficulty.  The abdomen was  insufflated.  The peritoneal cavity was confirmed, and 5-mm trocars were  placed two fingerbreadths above the pubis and also in the right lower  quadrant under direct visualization without difficulty.  The left adnexa was  grasped, and the infundibulopelvic ligament was cauterized using the  harmonic scalpel.  It was then removed  completely with the harmonic scalpel.  There was good hemostasis, again using the harmonic scalpel.  The specimen  was removed through the EndoCatch without difficulty.   Dr. Malvin Johns then came in and performed the liver biopsy.  He used a  pituitary biopsy forceps to take an area from the right edge of the liver  and then a Tru-cut liver biopsy needle to do a deeper biopsy.  Dr. Karilyn Cota  also saw the liver intraoperatively, and pictures were taken.  Hemostasis  was achieved using the harmonic scalpel, active blade, and irrigation was  performed in this area.  There was no active bleeding at the end of the  surgery.   The 5-mm trocars were removed under direct visualization.  There was no  bleeding.  The 10-11 trocar was removed, and the fascia was closed with two  0 Vicryl sutures.  The skin was closed using skin staples.  All three  incision sites  were injected with 0.5% Marcaine with 1:200,000 epinephrine  for postoperative anesthesia.   The patient was awakened from anesthesia and taken to the recovery room in  stable condition.  All counts were correct.                                               Lazaro Arms, M.D.    Loraine Maple  D:  05/26/2002  T:  05/26/2002  Job:  [id number]

## 2010-11-22 NOTE — Consult Note (Signed)
[name], [name]               ACCOUNT NO.:  [id number]   MEDICAL RECORD NO.:  [id number]          PATIENT TYPE:  INP   LOCATION:  A227                          FACILITY:  [facility]   PHYSICIAN:  Kofi A. Gerilyn Pilgrim, M.D. DATE OF BIRTH:  September 18, 1922   DATE OF CONSULTATION:  07/31/2005  DATE OF DISCHARGE:                                   CONSULTATION   REFERRING PHYSICIAN:  Corrie Mckusick, M.D.   HISTORY OF PRESENT ILLNESS:  This is an 77 year old, white female who  presents with abdominal complaints and vomiting.  She has a history of  diabetes and has had severe burning with pain involving the feet and legs.  She apparently has been treated with Neurontin which has been effective, but  seemed to not have worked well recently.  She ran out of the medication and  has been off of it and has noted significant increase in her symptoms off of  this medication.  She has had several falling spells and hence this  neurological consultation.  She apparently twisted a few weeks ago and  injured the left ankle.   PAST MEDICAL HISTORY:  1.  Non-insulin dependent diabetes mellitus.  2.  Abdominal discomfort.  3.  Reflux disease.  4.  History of cirrhosis due to undetermined etiology.  5.  Hypothyroidism.   PAST SURGICAL HISTORY:  1.  Appendectomy.  2.  Hysterectomy.  3.  Ophthalmic surgery.   ALLERGIES:  SULFA, AMBIEN and REGLAN.   SOCIAL HISTORY:  She is retired.  She has a supportive family.  No alcohol  or tobacco use.   PHYSICAL EXAMINATION:  GENERAL:  A thin, pleasant lady in no acute distress.  VITAL SIGNS:  Temperature 98.6, pulse 81, respirations 20, blood pressure  150/63.  HEENT:  Head is normocephalic, atraumatic.  NECK:  Supple.  ABDOMEN:  Soft.  EXTREMITIES:  No significant edema or varicosities.  She does have  tenderness of the right ankle.  NEUROLOGIC:  She is awake and alert.  She converses well.  Speech  unremarkable.  Cognition seems fine.  She follows commands  well and  comprehends fine.  Pupils equal round and reactive to light and  accommodation.  Extraocular movements full.  Visual fields are intact.  Facial muscle strength is symmetric.  Tongue is midline.  Uvula midline.  Shoulder shrugs are normal.  Motor examination shows good strength both  proximally and distally involving the upper and lower extremities.  Reflexes  are preserved.  Plantar reflexes both downgoing.  Sensory examination shows  somewhat diminished sensation in the feet.  Coordination shows normal finger-  to-nose and there is no evidence of dysmetria.  Gait is a little unsteady.  It is a little widened, but not spastic.  There is no shuffling noted.   ASSESSMENT:  Mild gait impairment, likely multifactorial with diabetic  neuropathy likely being the most significant role.  Aging and ischemic white  matter disease is also likely contributing.  She does not have any cerebral  causes at this time.  She has had magnetic resonance imaging in May 2006,  which  showed some white matter chronic ischemic changes confirming the  multifactorial nature of her gait impairment.   RECOMMENDATIONS:  Will suggest that she use assistive devices.  A cane will  probably be sufficient for now.  Agree with trial of Lyrica for neuropathic  symptoms.  Another option would be combining that with a sodium channel  blocker such as Tegretol or Trileptal, both low doses.   Thank you for this consultation.      Kofi A. Gerilyn Pilgrim, M.D.  Electronically Signed     KAD/MEDQ  D:  07/31/2005  T:  07/31/2005  Job:  [id number]

## 2010-11-22 NOTE — H&P (Signed)
[name], [name]               ACCOUNT NO.:  [id number]   MEDICAL RECORD NO.:  [id number]          PATIENT TYPE:  INP   LOCATION:  A328                          FACILITY:  [facility]   PHYSICIAN:  [name], M.D.    DATE OF BIRTH:  1922/07/09   DATE OF ADMISSION:  08/08/2005  DATE OF DISCHARGE:  LH                                HISTORY & PHYSICAL   CHIEF COMPLAINT:  Nausea and vomiting.   HISTORY OF PRESENT ILLNESS:  This is an 77 year old female who is just  discharged after an episode of urosepsis which responded well.  She has a  long standing history of cirrhosis which is probably cryptogenic versus NASH  but preserved hepatic function.  She also has chronic abdominal pain and  chronic anorexia and frequent episodes of nausea and vomiting.  She was  worked up in November this year by Dr. Karilyn Cota and Dr. Jena Gauss.  No significant  findings except for antral gastritis in May 2006.  She also had a small  gastric AVM and a single erosion.  Of note, she had CT angiogram in October  of this past year which was unremarkable.   PAST MEDICAL HISTORY:  1.  Diabetes mellitus.  2.  Hypothyroidism.  3.  Mild Alzheimer's disease.  4.  Osteoporosis.  5.  Nephrolithiasis.  6.  Gastroesophageal reflux disease.  7.  Peripheral neuropathy.  8.  Psoriasis.   PAST SURGICAL HISTORY:  1.  Appendectomy.  2.  Cataract surgery.  3.  Hysterectomy, as well as, right oophorectomy and later left      oophorectomy.   EMERGENCY DEPARTMENT COURSE:  The patient was discharged five days prior to  presenting back to the emergency department today with intractable nausea  and vomiting.  She has been treated with Reglan with improvement in her  symptoms.  A workup was negative except for potassium of 3.0, white count  normal, x-rays, etc, negative.  Urinalysis significant for a few WBC's only.   Of note, the patient was discharged on Levaquin p.o. as well as Macrodantin  for chronic use.   There is no  history of melena, hematemesis or hematochezia or genitourinary  symptoms at this time.  She also denies chest pain, shortness of breath,  headache or neurologic deficits.   The patient is admitted with intractable nausea and vomiting, mild  hypokalemia.   ALLERGIES:  SULFA, AMBIEN, CODEINE, REGLAN.   CURRENT MEDICATIONS:  1.  Synthroid 75 mg daily.  2.  Prevacid 30 mg daily.  3.  Aricept 5 daily.  4.  Actonel weekly.  5.  Florinef 0.1 daily.  6.  Rozerem 8 mg q.h.s.  7.  Nexium 40 daily.  8.  Glucotrol XL p.r.n.   FAMILY HISTORY:  Noncontributory.   REVIEW OF SYSTEMS:  Negative except as mentioned.   PHYSICAL EXAMINATION:  GENERAL APPEARANCE:  This is a very pleasant elderly  female who is in no acute distress.  Currently, she is alert and oriented  and very conversant.  VITAL SIGNS:  Within normal limits.  HEENT:  Normocephalic, atraumatic.  Pupils equal.  Ears, Nose, Throat  benign.  SKIN:  Turgor is good.  Mucous membranes are moist.  NECK:  Supple.  No bruits, thyromegaly, lymphadenopathy.  HEART:  Sounds are normal.  LUNGS:  Clear.  ABDOMEN:  No significant tenderness.  Bowel sounds are intact.  EXTREMITIES:  No cyanosis, clubbing or edema.  NEUROLOGICAL:  Within normal limits.   ASSESSMENT:  Nausea and vomiting which apparently is a chronic and recurrent  problem for this elderly female with the multiple medical problems noted  above.  Most likely culprit at this time is oral antibiotic therapy.   PLAN:  Admit for IV hydration, close observation and symptomatic therapy.  Will withhold antibiotics at this time pending repeat urine culture.  Will  follow and treat expectantly.      [name], M.D.  Electronically Signed     MC/MEDQ  D:  08/08/2005  T:  08/08/2005  Job:  [id number]

## 2010-11-22 NOTE — Discharge Summary (Signed)
NAMETASNIA, SPEGAL               ACCOUNT NO.:  [id number]   MEDICAL RECORD NO.:  [id number]          PATIENT TYPE:  INP   LOCATION:  A218                          FACILITY:  [facility]   PHYSICIAN:  Madelin Rear. Sherwood Gambler, MD  DATE OF BIRTH:  08-20-1922   DATE OF ADMISSION:  05/07/2005  DATE OF DISCHARGE:  11/10/2006LH                                 DISCHARGE SUMMARY   DISCHARGE DIAGNOSES:  1.  Chronic abdominal pain question etiology.  2.  Severe back pain.  3.  Hypothyroidism.  4.  Organic brain syndrome.  5.  Osteoporosis.  6.  Hypertension.   DISCHARGE MEDICATIONS:  Synthroid, Aricept, Actonel, calcium, vitamin D,  Prevacid.   SUMMARY:  Patient was admitted severe back pain.  She had some pleuritic  component to it as well as vague history of fever.  She was noted to have a  urinary tract infection on admission as well which resulted in antibiotic  therapy.  She was continued on her usual medications, monitoring blood  sugars as well as blood pressure.  Day of discharge she was stable/improved.  Follow up in office.      Madelin Rear. Sherwood Gambler, MD  Electronically Signed     LJF/MEDQ  D:  06/01/2005  T:  06/01/2005  Job:  [id number]

## 2010-11-22 NOTE — Discharge Summary (Signed)
[place]  Patient:    [name], [name] Visit Number: [id number] MRN: [id number]          Service Type: OUT Location: RAD Attending Physician:  Malissa Hippo Dictated by:   Bonnell Public, M.D. Admit Date:  07/05/2001 Discharge Date: 07/05/2001                             Discharge Summary  ADDENDUM  HOSPITAL COURSE:  #1-  ATYPICAL CHEST PAIN:  CT of the chest was obtained which was not suggestive of any etiology for chest pain such as PE, aortic dissection, pneumonia, or pneumothorax.  The possibility of costochondritis was entertained as her cardiac enzymes were entirely negative and the patients chest pain actually improved during the hospitalization.  #2 - OVARIAN CYST:  This was described as stable per report of vaginal ultrasound that was obtained during this hospitalization.  They recommended follow-up in three to four months.  Patient will be referred to a gynecologist as an outpatient for follow-up.  #3 - HYPOTHYROIDISM:  The patient was discharged home on Synthroid 75 mcg daily.  This was a stable problem.  Her TSH was normal during this hospitalization.  #4 - CIRRHOSIS OF UNKNOWN ETIOLOGY:  The patients liver enzymes were slightly elevated at the time of admission.  AST was 67 and ALT was 64.  Her alkaline phosphatase was 148.  Total bilirubin was 1.3.  Albumin was 3.7.  Her total protein was 8.5.  The patient had an amylase of 126 and a lipase of 39.  A GI consult was obtained and Dr. Juanda Chance performed an abdominal ultrasound with Dopplers and reported that blood flow in the portal vein was consistent with a diagnosis of chronic liver disease.  Several different specialized tests were obtained in order to sort out the possible etiologies of her cirrhosis.  ANA was obtained.  Serum plasma level was obtained.  An ammonia level was also obtained.  All these values were within normal limits.  We also ruled  out hemachromatosis.  We also looked at a viral hepatitis panel which was entirely negative.  In the end, the possibility of obtaining a liver biopsy was brought up.  However, it seems that the patient is really not having any active issues and there seems to be little point in going ahead with a tissue diagnosis at this time.  The patient is to follow up with her primary care Augustino Savastano.  #5 - URINARY TRACT INFECTION:  The patient was treated empirically with Cipro for seven days.  The patient was discharged home to her family and is to follow up with her primary care [facility].  In the end, nothing was essentially diagnosed other than idiopathic cirrhosis.  This does not seem to be an active problem at this time. Dictated by:   Bonnell Public, M.D. Attending Physician:  Malissa Hippo DD:  09/07/01 TD:  09/07/01 Job: [id number]

## 2010-11-22 NOTE — H&P (Signed)
[name], [name]               ACCOUNT NO.:  [id number]   MEDICAL RECORD NO.:  [id number]          PATIENT TYPE:  INP   LOCATION:  A218                          FACILITY:  [facility]   PHYSICIAN:  Madelin Rear. Sherwood Gambler, MD  DATE OF BIRTH:  1922-10-18   DATE OF ADMISSION:  DATE OF DISCHARGE:  LH                                HISTORY & PHYSICAL   CHIEF COMPLAINT:  Back pain.   HISTORY OF PRESENT ILLNESS:  The patient presents with progressively  increasing bilateral left greater than right back and flank pain. She has  had nausea and vomiting as well as some chest discomfort. She describes the  chest pain as substernal and independent of the bilateral flank pain  although this pain does get worse in the flank and back when she takes a  deep breath. She also ha some shortness of breath associated with this.   She has had some fever and chills, but in the office, she is 98.8. She has  recently been placed on antibiotics for urinary tract infection. However,  repeat UA shows marked pyuria.   PAST MEDICAL HISTORY:  1.  Noninsulin-dependent diabetes mellitus.  2.  Previous chronic abdominal discomfort and reflux disease as well as      cirrhosis that is cryptogenic.  3.  Organic brain syndrome, maintained on Aricept.  4.  She had a recent CT angiogram performed to rule out mesenteric ischemia,      and this in fact negative. She was established as having good blood      flow.  5.  Hypothyroidism.   PAST SURGICAL HISTORY:  1.  She is status post hysterectomy.  2.  Status post appendectomy.  3.  Ophthalmic surgery.   ALLERGIES:  She is known to be allergic to SULFA as well as AMBIEN and  REGLAN causing adverse side effects.   SOCIAL HISTORY:  She does not smoke or drink. She has supportive family in  attendance. She is retired.   FAMILY HISTORY:  Unknown father history, deceased unknown causes. Mother had  cardiac problems and renal failure, and she had grandparents with  hypertension.   REVIEW OF SYSTEMS:  As under HPI. Specifically, she denied hematemesis,  hematochezia, or melena.   PHYSICAL EXAMINATION:  GENERAL:  She appears acutely distress and mildly  tachypneic in the 20s.  HEENT:  Her head and neck show no JVD or adenopathy. Neck was supple.  CHEST:  Clear.  CARDIAC:  Regular rhythm without gallop or rub.  ABDOMEN:  Positive bilateral left greater than right upper quadrant  tenderness. No guarding or rebound. No organomegaly or masses. Positive CVA  tenderness.  EXTREMITIES:  Without clubbing, cyanosis, or edema.  NEUROLOGICAL:  Nonfocal.   LABORATORY DATA:  In the office were obtained. Urinalysis positive, 50 to  100 white cells per high powered field in clumps as well as hematuria 25 to  50 white cells per high powered field. She had CT angiography performed as  mentioned above which was negative for mesenteric ischemia.   IMPRESSION:  1.  Abdominal/flank pain with persistent pyuria. Obviously the  most likely      differential consideration is urosepsis, failure to respond to      antibiotics as outpatient.  2.  Chest pain. She will be admitted for telemetry monitoring, rule out      protocol, cardiac consultation.  3.  Chronic abdominal pain, anorexia, etc. Will seek a GI consultation and      manage reflux disease with protein pump inhibitor.  4.  Hypothyroidism. Continue p.o. medications.  5.  Cirrhosis. No acute intervention at this time. Expected observation.      Madelin Rear. Sherwood Gambler, MD  Electronically Signed     LJF/MEDQ  D:  05/07/2005  T:  05/07/2005  Job:  [id number]

## 2010-11-22 NOTE — Op Note (Signed)
[name], [name]               ACCOUNT NO.:  [id number]   MEDICAL RECORD NO.:  [id number]          PATIENT TYPE:  INP   LOCATION:  A318                          FACILITY:  [facility]   PHYSICIAN:  Lionel December, M.D.    DATE OF BIRTH:  1922-11-24   DATE OF PROCEDURE:  11/08/2004  DATE OF DISCHARGE:                                 OPERATIVE REPORT   PROCEDURE:  Esophagogastroduodenoscopy.   INDICATIONS:  [name] is a 77 year old Caucasian female with multiple medical  problems including cirrhosis and dementia with anorexia, early satiety,  nausea, heaving and weight loss.  She is undergoing diagnostic EGD.  Procedure was reviewed with the patient and her daughter-in-law and consent  was obtained from her daughter-in-law, [name].   PREMEDICATION:  Cetacaine spray for pharyngeal topical anesthesia, Demerol  25 mg IV, Versed 2 mg IV.   FINDINGS:  The procedures were performed in the endoscopy suite.  The  patient's vital signs and O2 saturation were monitored during the procedure  and remained stable.   The patient was placed in the left lateral recumbent position and Olympus  videoscope was passed through the oropharynx without any difficulty into the  esophagus.   Esophagus:  Mucosa of the esophagus was normal throughout.  GE junction was  at 40 cm from the incisors.  No varices were identified.   Stomach:  It was empty and distended very well on insufflation.  Folds of  the proximal stomach were normal.  A single 4 mm AVM at the gastric body  __________ not bleeding and left alone.  There was some antral mucosal  granularity and single erosion.  Pyloric channel was patent.  No ulcers were  noted.  Angularis, fundus and cardia were examined by retroflexing the scope  and were normal.   Duodenum:  Bulbar mucosa was normal.  Scope was passed to the second part of  the duodenum.  Mucosa and folds were normal.   The endoscope was withdrawn.  The patient tolerated the  procedure well.   FINAL DIAGNOSIS:  1.  No evidence of esophageal and or gastric varices.  2.  Mild antral gastritis, single erosion and small gastric arteriovenous      malformation.   These findings would not explain the patient's symptomatology.   PLAN:  Will proceed with abdominopelvic CT scan.      NR/MEDQ  D:  11/08/2004  T:  11/08/2004  Job:  [id number]   cc:   [name], M.D.  [address]  [place]  [place] [address]  Fax: [phone]

## 2010-11-22 NOTE — Discharge Summary (Signed)
[name], [name]               ACCOUNT NO.:  [id number]   MEDICAL RECORD NO.:  [id number]          PATIENT TYPE:  INP   LOCATION:  A328                          FACILITY:  [facility]   PHYSICIAN:  [name], M.D.    DATE OF BIRTH:  07/27/1922   DATE OF ADMISSION:  08/08/2005  DATE OF DISCHARGE:  02/06/2007LH                                 DISCHARGE SUMMARY   DISCHARGE DIAGNOSES:  1.  Recurrent nausea and vomiting with questionable etiology likely related      to nitrofurantoin therapy, symptoms resolved.  2.  Cryptogenic cirrhosis versus nonalcoholic steatohepatitis (NASH) with      preserved hepatic function.  3.  Chronic abdominal pain and anorexia, status post extensive workup by Dr.      Jena Gauss and Dr. Karilyn Cota.  Computed tomography angiogram negative.  4.  Diabetes mellitus.  5.  Hyperthyroidism, compensated.  6.  Mild Alzheimer's disease.  7.  Osteoporosis.  8.  Nephrolithiasis.  9.  Gastroesophageal reflux disease.  10. Peripheral neuropathy.  11. Psoriasis.   PAST SURGICAL HISTORY:  1.  Appendectomy.  2.  Cataract surgery.  3.  Hysterectomy.  4.  Bilateral oophorectomies.   HISTORY OF PRESENT ILLNESS:  For details regarding admission, please refer  to the admitting note.  Briefly, this 77 year old female with the above  history had just been discharged 5 days prior after having been admitted  with UTI and hypokalemia.  She did well in the hospital and was discharged  on Levaquin as well as Macrodantin p.o.  Other medications are noted below.   The patient returned to the emergency department with nausea and vomiting.  Workup was benign except for a potassium of 3.0.  Urinalysis significant for  a few white cells only.  The patient was admitted with nausea, vomiting and  questionable etiology.   HOSPITAL COURSE:  The patient was admitted and rehydrated and potassium  supplementation begun.  Ultrasound of her abdomen was obtained which is  benign.  She continued to  improve, although she complains of ongoing  anorexia (month long history of same).  Currently, the patient is doing  well.  Her vital signs are stable.  Her laboratory is normalized and she has  reached maximal hospital benefit.   DISCHARGE MEDICATIONS:  1.  Synthroid 75 mcg daily.  2.  Prevacid 30 mg daily.  3.  Aricept 5 daily.  4.  Actonel each week.  5.  Florinef one daily.  6.  Rozerem 8 mg nightly.  7.  Nexium 40 mg daily.  8.  Glucotrol XL 5 mg daily.   ALLERGIES:  SULFA, AMBIEN and CODEINE.  Reported allergy to [facility], although  she is tolerating this well at this time.      [name], M.D.  Electronically Signed     MC/MEDQ  D:  08/12/2005  T:  08/12/2005  Job:  [id number]

## 2010-11-22 NOTE — Consult Note (Signed)
[name], [name]               ACCOUNT NO.:  [id number]   MEDICAL RECORD NO.:  [id number]          PATIENT TYPE:  INP   LOCATION:  A227                          FACILITY:  [facility]   PHYSICIAN:  Dennie Maizes, M.D.   DATE OF BIRTH:  [birth year]   DATE OF CONSULTATION:  08/03/2005  DATE OF DISCHARGE:                                   CONSULTATION   REASON FOR CONSULTATION:  Recurrent urinary tract infections.   HISTORY OF PRESENT ILLNESS:  This 77 year old female is known to me from  prior evaluation and treatment.  She has history of recurrent urolithiasis  and she has undergone lithotripsy in the past.   The patient has been admitted to the hospital with history of peripheral  neuropathy, cirrhosis of the liver, hypothyroidism, urinary incontinence and  recurrent urinary tract infections.  She has been having fatigue, worsening  of peripheral neuropathy, urinary tract infection and hypokalemia.  I was  asked to see the patient for further evaluation and recurrent urinary tract  infections.  The patient also has increased urinary frequency x6-7, nocturia  x4-5, urinary urgency and urge incontinence.  She denied having and stress  urinary incontinence.  No history of fever, chills, voiding difficulty,  hematuria, dysuria at present.   PAST MEDICAL HISTORY:  1.  Non-insulin-dependent diabetes mellitus.  2.  GERD.  3.  Cirrhosis of the liver.  4.  Organic brain syndrome.  5.  Hypothyroidism.  6.  Recurrent urinary tract infection.  7.  Recurrent urolithiasis.   MEDICATIONS:  1.  Synthroid 75 mcg one p.o. daily.  2.  Restoril 30 mg one p.o. q.h.s.  3.  Actonel 5 mg one p.o. daily.  4.  Prevacid 30 mg p.o. b.i.d.  5.  Aricept 5 mg p.o. daily.  6.  Lyrica 15 mg p.o. t.i.d.  7.  Levaquin for urinary tract infection.   ALLERGIES:  SULFA, CODEINE, REGLAN, AMBIEN.   PHYSICAL EXAMINATION:  ABDOMEN:  Soft, no palpable flank mass or CVA  tenderness.  Bladder is not palpable.  No  supraclavicular tenderness.   ADMISSION LABORATORY DATA:  CBC:  WBC 9.4, hemoglobin 12.2, hematocrit 35.5,  BUN 13, creatinine 1.0.  Urinalysis revealed positive nitrite, microscopic  WBC 3-6 per high-powered field, bacteria a few.  Urine culture and  sensitivity revealed no growth.   Non-contrast CT scan of the abdomen and pelvis was done to evaluate for  urinary calculi or obstruction.  There were no renal masses.  There was a 2  mm sized nonobstructive stone in the left kidney.  There were no ureteral  calculi.   Catheterization revealed a postvoid residual of 0 mL.   IMPRESSION:  Recurrent urinary tract infections, urinary urgency, urge  urinary incontinence.   PLAN:  1.  Recurrent urinary tract infections:  The patient does not have any      underlying abnormalities for recurrent urinary tract infections.  We      will keep her on Macrodantin 50 mg one p.o. q.h.s., antibiotic      prophylaxis.  2.  Urinary frequency/urgency and urge incontinence:  The  patient was      started on Detrol LA 4 mg p.o. daily.  3.  The patient will keep her follow up appointment in the office in two      weeks.      Dennie Maizes, M.D.  Electronically Signed     SK/MEDQ  D:  08/03/2005  T:  08/04/2005  Job:  [id number]   cc:   Corrie Mckusick, M.D.  Fax: [phone]   Madelin Rear. Sherwood Gambler, MD  Fax: [phone]

## 2010-11-22 NOTE — Discharge Summary (Signed)
[place]  Patient:    [name], [name] Visit Number: [id number] MRN: [id number]          Service Type: Attending:  Farley Ly, M.D. Dictated by:   Doreatha Martin, M.D. Adm. Date:  05/27/01 Disc. Date: 06/02/01                             Discharge Summary  DISCHARGE DIAGNOSES: 1. Chest pain. 2. Recurrent urinary tract infection. 3. Left ovarian cyst. 4. Hypothyroidism. 5. Cirrhotic changes on abdominal CT scan. 6. Head and neck pain.  DISCHARGE MEDICATIONS: 1. Aricept 5 mg p.o. q.d. 2. Synthroid 75 mcg p.o. q.d. 3. Darvon 65 mg one tablet p.o. q.4-6h p.r.n. pain. 4. Ambien 5 mg one tablet p.o. q.h.s. p.r.n. 5. Prevacid 20 mg p.o. q.d.  HISTORY OF PRESENT ILLNESS: The patient is a 77 year old white female with recently diagnosed questionable cervical cancer who presents with renal history of shortness of breath and episodic chest pain that has progressively worsened over the last week.  The patient first noticed shortness of breath with exertion three months ago but has not been stable.  The patient had shortness of breath with one flight of stairs until last week.  Yesterday she felt short of breath and very weak while going to the grocery store which represents a decrease in function.  The patient also complains of throbbing substernal chest pain accompanied by dizziness, confusion and palpitations. Signs and symptoms are episodic in nature.  These episodes began about two years ago and have become more frequent recently.  The patient denies orthopnea, paroxysmal nocturnal dyspnea or lower extremity edema.  No history of coronary artery disease or myocardial infarction.  The patient has never used tobacco.  ADMISSION LABORATORY DATA:  Includes a white blood cell count 12.5, hemoglobin 13.8, platelet count 131K.  BASIC METABOLIC PANEL:  Shows sodium 440, potassium 4.0, chloride 105, bicarbonate 24, BUN 15, creatinine 1.3,  glucose 87.  LIVER FUNCTION TESTS:  SGOT 67.  ELECTROCARDIOGRAM:  The EKG on admission shows stable rate with no acute ST or T wave changes.  RADIOGRAPHS:  Admission chest x-ray shows chronic disease/changes in the left apex. Dictated by:   Doreatha Martin, M.D. Attending:  Farley Ly, M.D. DD:  06/04/01 TD:  06/04/01 Job: [id number]

## 2010-11-22 NOTE — H&P (Signed)
NAMEMILANA, SALAY               ACCOUNT NO.:  [id number]   MEDICAL RECORD NO.:  [id number]          PATIENT TYPE:  INP   LOCATION:  A330                          FACILITY:  [facility]   PHYSICIAN:  Madelin Rear. Sherwood Gambler, MD  DATE OF BIRTH:  03-31-1923   DATE OF ADMISSION:  09/10/2005  DATE OF DISCHARGE:  LH                                HISTORY & PHYSICAL   CHIEF COMPLAINT:  Vomiting, abdominal pain.   HISTORY OF PRESENT ILLNESS:  The patient has had multiple admissions for  recurrent bouts of abdominal pain, anorexia, weight loss and vomiting. She  has a recurrence of these chronic symptoms. She states that she was in her  usual state of health and has had several days to three days of increasing  abdominal epigastric pain with vomiting. This has been accompanied, of  course, with chronic nausea and increase in her anorectic episodes.   PAST MEDICAL HISTORY:  1.  She has a past medical history of nonalcoholic steatohepatitis with well-      compensated cirrhosis.  2.  Osteoporosis.  3.  Organic brain syndrome.  4.  Hypothyroidism.  5.  She also suffers from chronic insomnia.  6.  I have diagnosed her as being severely depressed. However, she for one      reason or another refuses to take amitriptyline.   SOCIAL HISTORY:  Nonsmoker, nondrinker. No alcohol or drug use.   FAMILY HISTORY:  Noncontributory.   REVIEW OF SYSTEMS:  As under HPI. She has had no hematemesis, hematochezia  or melena.   PHYSICAL EXAMINATION:  GENERAL:  She is in her usual state of health.  SKIN:  Unremarkable.  HEENT:  Head and neck:  No JVD or adenopathy.  NECK:  Supple.  CHEST:  Clear.  CARDIAC:  Regular rhythm with no murmur, gallop or rub.  ABDOMEN:  Soft. No organomegaly or masses.  EXTREMITIES:  Without clubbing, cyanosis or edema.  NEUROLOGICAL:  Nonfocal.   LABORATORY DATA:  As outlined in the record are remarkable for mild  thrombocytopenia which is her baseline. She had a normal lipase.  Electrolytes also revealed hypokalemia.   ASSESSMENT:  1.  She will be admitted for potassium repletion, IV hydration. It should be      noted she has been taking Actonel which we are going to discontinue and      see if that helps with her chronic nausea and dyspepsia. Will continue      her protein pump inhibitor. GI consultation will be obtained.  2.  Cirrhosis appears to be well compensated at present. Expected      observation. Intervene as needed.  3.  Probable depression, untreated. We are going to try some Lexapro on this      admission and see if tolerates that under supervision.      Madelin Rear. Sherwood Gambler, MD  Electronically Signed     LJF/MEDQ  D:  09/10/2005  T:  09/10/2005  Job:  [id number]

## 2010-11-22 NOTE — Procedures (Signed)
[name], [name]               ACCOUNT NO.:  [id number]   MEDICAL RECORD NO.:  [id number]          PATIENT TYPE:  INP   LOCATION:  A218                          FACILITY:  [facility]   PHYSICIAN:  Dani Gobble, MD       DATE OF BIRTH:  May 04, 1923   DATE OF PROCEDURE:  05/09/2005  DATE OF DISCHARGE:                                  ECHOCARDIOGRAM   REFERRING PHYSICIAN:  Madelin Rear. Sherwood Gambler, M.D./Ann Domingo Sep, M.D.   INDICATIONS:  Chest pain.   M-MODE TRACINGS:  The aorta measures normally at 2.7 cm.   Left atrium is mildly dilated and measured at 4.2 cm.  The patient appeared  to be in sinus rhythm during this procedure.   The interventricular septum is mildly thickened particularly in the basal  portion as is common in the elderly while the posterior wall is within  normal limits in thickness at 1.0 cm.   The aortic valve is trileaflet and pliable with normal leaflet excursion.  There is mild thickening, but no significant stenosis.  Mild aortic  insufficiency is noted.   The mitral valve also appears mildly thickened, but no mitral valve prolapse  is appreciated.  Mild mitral regurgitation is noted.  Doppler interrogation  of the mitral valve is within normal limits.   Pulmonic valve appears grossly structurally normal.   Tricuspid valve also appears grossly structurally normal with mild tricuspid  regurgitation noted.  Estimated RVSP is approximately 40-50 mmHg.   Left ventricle is normal in size with LVIDD measured at 3.6 cm and LVISD  measured at 2.4 cm.  Overall left ventricular systolic function is normal  and no regional wall motion abnormalities are appreciated.   The right atrium and right ventricle appear mildly dilated, but with normal  right ventricular systolic function.   IMPRESSION:  1.  Mild left atrial enlargement.  2.  Mild basal septal hypertrophy as is common in the elderly.  3.  Minimal aortic sclerosis without stenosis.  4.  Mild aortic  insufficiency.  5.  Mild mitral and tricuspid regurgitation.  6.  Estimated RVSP is approximately 40-50 mmHg.  7.  Normal left ventricular size and systolic function without regional wall      motion abnormality noted.  8.  Mildly dilated right atrium and right ventricle with normal left      ventricular systolic function.  9.  As compared to prior echocardiogram in 2005, the right atrium and right      ventricle appear mildly dilated otherwise no significant change.           ______________________________  Dani Gobble, MD     AB/MEDQ  D:  05/09/2005  T:  05/09/2005  Job:  [id number]

## 2010-11-22 NOTE — H&P (Signed)
[name], [name]               ACCOUNT NO.:  [id number]   MEDICAL RECORD NO.:  [id number]          PATIENT TYPE:  INP   LOCATION:  A318                          FACILITY:  [facility]   PHYSICIAN:  [name], M.D.    DATE OF BIRTH:  1923/07/06   DATE OF ADMISSION:  11/07/2004  DATE OF DISCHARGE:  LH                                HISTORY & PHYSICAL   CHIEF COMPLAINT:  Weight loss, early satiety, and failure to thrive.   HISTORY OF PRESENT ILLNESS:  This is a 77 year old female, patient of Dr.  Sherwood Gambler as well as Dr. Karilyn Cota. She has long-standing history of diabetes  mellitus with associated diabetic neuropathy. She also has compensated  hypothyroidism, mild organic brain syndrome, and severe gastroesophageal  reflux disease. She also has cryptogenic cirrhosis documented by liver  biopsy in 2003. She is status post hysterectomy and oophorectomy. A  hydrosalpinx was the ultimate diagnosis. The patient states she has a  history of ovarian cancer, but I believe she is misinformed regarding this  matter. She also has urinary incontinence and osteoporosis. She has taken  Actonel in the past.   The patient presented to the emergency department today with a several month  history of increasing severe early satiety. She also relates a 30 to 40  pound weight loss over the past six to eight months. Physical examination  was relatively unremarkable. Laboratory was also unremarkable except for a  serum ammonia of 49. Liver functions were normal as was blood count, etc.  She did have 3 to 6 white cells in her urine. Aside from chronic  constipation, the patient denies other GI symptomatology. She does have  dysphagia but is able to swallow without significant difficulty. There has  been no melena, hematemesis, hematochezia or genitourinary symptoms. She  also denies headache, shortness of breath, chest pain. She had one episode  of nausea today, though this has not been an ongoing complaint.   The patient is admitted with significant weight loss as well as early  satiety for further evaluation and therapy as indicated.   CURRENT MEDICATIONS:  1.  Aricept 5 mg q.d.  2.  Calcium with D.  3.  Restoril 30 q.h.s. p.r.n.  4.  Synthroid 75 mcg q.d.  5.  Detrol LA 4 mg q.d.  6.  Prevacid 30 mg q.d.  7.  Neurontin 200 mg q.i.d.  8.  Glucotrol 2.5 mg p.r.n. (?).  9.  Reglan 5 mg t.i.d.   ALLERGIES:  SULFA.   PAST HISTORY:  As noted above.   SOCIAL HISTORY:  Nonsmoker, nondrinker. She is relatively independent.   REVIEW OF SYSTEMS:  Negative except as mentioned.   FAMILY HISTORY:  Noncontributory.   PHYSICAL EXAMINATION:  GENERAL:  A very pleasant, alert female who is in no  acute distress. Her color is good.  VITAL SIGNS:  BP 117/50, heart rate 82, respirations 20 and unlabored. She  is afebrile. O2 saturation 96% on room air.  HEENT:  Normocephalic, atraumatic. The pupils are equal. There is no scleral  icterus. Ears, nose, throat are benign. She is  well hydrated.  NECK:  Supple. No audible bruits noted. There are no masses or thyromegaly  or adenopathy.  LUNGS:  Clear to A&P.  HEART:  Normal except for soft systolic murmur. No gallop or rub noted.  ABDOMEN:  Essentially benign except for mild epigastric tenderness. I  palpate no masses or organomegaly. There are no bruits present. Liver is  nonpalpable.  EXTREMITIES:  No clubbing, cyanosis, or edema.  NEUROLOGICAL:  Reveals moderate sensory deficit in stocking glove  distribution. There are no focal abnormalities. Mental status is intact at  this time.   ASSESSMENT:  Early satiety and significant weight loss in elderly female  with history as noted above. Currently, she is stable.   PLAN:  GI consult for probable upper endoscopy. Also CT scanning of the  abdomen and pelvis as well as routine chest pain. Urine culture to be  obtained. I have chosen to hold Reglan as well as Detrol and reduce the dose  of Neurontin,  hoping that her symptoms are from medication effect, though  possibility of more serious pathology remains. Will follow and treat  expectantly.      MC/MEDQ  D:  11/07/2004  T:  11/07/2004  Job:  [id number]

## 2010-11-22 NOTE — Op Note (Signed)
   [name], [name]                         ACCOUNT NO.:  [id number]   MEDICAL RECORD NO.:  [id number]                   PATIENT TYPE:  AMB   LOCATION:  DAY                                  FACILITY:  [facility]   PHYSICIAN:  Barbaraann Barthel, M.D.              DATE OF BIRTH:  Jan 25, 1923   DATE OF PROCEDURE:  05/26/2002  DATE OF DISCHARGE:                                 OPERATIVE REPORT   SURGEON:  Dr. Malvin Johns.   ASSISTANT SURGEON:  Dr. Despina Hidden.   PREOPERATIVE DIAGNOSES:  1. Cirrhosis of the liver.  2. Hydrosalpinx.   PROCEDURE:  Liver biopsy using pituitary forceps and core Tru-Cut needle  biopsy.   INDICATIONS FOR PROCEDURE:  This is a 77 year old white female who had  cirrhosis of the liver.  I was consulted by Dr. Despina Hidden to perform a liver  biopsy.  Preoperatively, her clotting studies and platelets were grossly  within normal limits.  This patient was not a known drinker or was there an  obvious etiology for her cirrhosis.  Dr. Despina Hidden had planned to do an ovarian  procedure, and after this had terminated he asked me to assist with a liver  biopsy.   GROSS OPERATIVE FINDINGS:  There appeared to be a macronodular cirrhotic-  appearing liver affecting both the right and left lobe of the liver equally.   TECHNIQUE:  Through the 5-mm cannula that had been previously placed for his  ovarian incision, I placed punch biopsy forceps through the cannula and then  removed a portion of the edge of the liver on the right lobe and sent this  for permanent section as requested.  A  Tru-Cut biopsy was also performed just to the right of the gallbladder, and  we watched this carefully and cauterized  both sides with the harmonic stapling device.  We observed this for  approximately five minutes or so, and there was no active bleeding.  We then  desufflated the abdomen after checking for hemostasis.  Closure of the  abdomen was carried out by Dr. Despina Hidden.  For details regarding the full  procedure, please see Dr. Forestine Chute notes.                                               Barbaraann Barthel, M.D.    WB/MEDQ  D:  05/26/2002  T:  05/26/2002  Job:  [id number]   cc:   Lazaro Arms, M.D.  [address], [address]  [place]  [place] [address]  Fax: [phone]   Lionel December, M.D.  [address]  [place]  [place] [address]  Fax: [phone]

## 2010-11-22 NOTE — Consult Note (Signed)
NAMEMILENA, LIGGETT               ACCOUNT NO.:  [id number]   MEDICAL RECORD NO.:  [id number]          PATIENT TYPE:  INP   LOCATION:  A318                          FACILITY:  [facility]   PHYSICIAN:  Lionel December, M.D.    DATE OF BIRTH:  1923/02/10   DATE OF CONSULTATION:  11/07/2004  DATE OF DISCHARGE:                                   CONSULTATION   REASON FOR CONSULTATION:  Anorexia, early satiety, weight loss.   HISTORY OF PRESENT ILLNESS:  Cyenna is a 77 year old Caucasian female who was  admitted to Dr. Geanie Logan service via the emergency room today.  She  presented with a one-week history of anorexia, early satiety, nausea with  heaving, and weight loss.  She was last seen in our office six weeks, when  she weighed 133-1/2 pounds, and now she is down to 125 pounds.  The patient  has had problems with anorexia off and on for the last 3-4 years.  She was  seen in our office in January, 2006 because of GERD.  She was doing better  with Prevacid.  She weighed 134 pounds.  The patient returned again on September 25, 2004.  Patient called the office about three months ago with symptoms of  GERD.  Reglan was added at 5 mg t.i.d.  She returned on September 25, 2004 and  was doing better.  At that time, she weighed 133-1/2 pounds.  She is also  complaining of constipation, and MiraLax dose was increased to daily.  Patient states that she has not had any bowel movements x3 days.  She has  vomited and regurgitated some frothy liquid but denies hematemesis or  melena.  She denies abdominal pain.  She complains of generalized soreness.  She complains of burning in her lower extremities and weakness.  She says  when she walks, her knees just give out, and she has had multiple falls.  She also has been forgetful, according to her daughter-in-law, who is also a  [employer], and takes care of her at home.   Review of systems is negative for headache, fever, chills, night sweats,  heartburn, dysphagia.  She  feels depressed.  She is also concerned that she  has to be at home by herself at night.   The patient has a history of cirrhosis, which is reviewed below.  Her LFTs  were normal in January, 2006.  Her albumin was 3.6.   Her usual medications include Aricept 5 mg daily, Actonel 5 mg daily,  Restoril 30 mg q.h.s., Prevacid 30 mg q.a.m., Synthroid 75 mcg daily, Detrol  4 mg daily, ICaps b.i.d., Colace 1 daily, calcium with vitamin D daily,  Neurontin 100 mg q.i.d., Glucotrol XL 2.5 mg daily, but she does not take  daily, Carafate 1 gm __________, Reglan 5 mg t.i.d.   PAST MEDICAL HISTORY:  She has cirrhosis, which was diagnosed in November,  2002 with an abnormal abdominopelvic CT.  Her spleen at that time was not  enlarged; however, she had liver contour suggestive of cirrhosis.  At that  time, her ammonia was mildly elevated;  however, her transaminases were  normal.  Subsequently, she has had mildly elevated transaminases.  Workup at  that time included a negative hepatitis B surface antigen, nonreactive  hepatitis C antibody, negative ANA, negative AMA.  Normal __________ level  and a ferritin of 17.   She had an EGD in December, 2002, which was negative for esophageal varices.  She has had periodic LFTs, and these have been normal.  Her ammonia level  has been normal or low-normal.  Clinically, she has not had hepatic  encephalopathy.  Finally, the patient had a liver biopsy at the time of the  laparoscopy by Dr. Elyn Peers for left adnexal mass.  This mass was benign ovarian  cyst.  Liver biopsy confirmed cirrhosis with mild inflammation, and there  were no specific features to suggest etiology.   Other medical problems include diabetes mellitus, hypothyroidism,  Alzheimer's disease, osteoporosis, history of kidney stones, chronic GERD.  She has had appendectomy, bilateral cataract surgery.  She had hysterectomy  with right oophorectomy several years ago.  As noted above, she had her   other ovary removed in November, 2003.  She also has a history of psoriasis.  Peripheral neuropathy, possibly secondary to diabetes mellitus.   ALLERGIES:  SULFA.   FAMILY HISTORY:  Noncontributory.  She has one sister who lives out of state  and is in poor health.   SOCIAL HISTORY:  She is a widow.  She has two sons living.  One daughter  died of leukemia at age 68.  Another daughter died a few years ago in her  2s.  The rest of the children died at or soon after birth.  She does not  smoke cigarettes and has never drank alcohol.  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  She lives alone.  Her daughter-in-law is a Engineer, materials and takes care of her in an official capacity.   PHYSICAL EXAMINATION:  VITAL SIGNS:  Pulse 76, blood pressure 107/45, temp  98, respiratory rate 18.  GENERAL:  A pleasant, well-developed, thin Caucasian female who is in no  acute distress.  She weighs 125.2 pounds.  She is 64 inches tall.  She is  alert and oriented to place, person, and time.  She does not have asterixis.  HEENT:  Conjunctivae pink.  Sclerae are anicteric.  Oropharyngeal mucosa is  normal.  She has a partial lower plate.  NECK:  No neck masses or thyromegaly noted.  LUNGS:  Clear to auscultation.  HEART:  Regular rhythm.  Normal S1 and S2.  No murmur or gallop noted.  ABDOMEN:  Symmetrical.  Bowel sounds are normal.  Palpation reveals a soft  abdomen without tenderness or masses.  Her liver edge is firm and palpable  below the RCM.  The spleen is __________.  EXTREMITIES:  She does not have peripheral edema or clubbing.   LABS:  WBC 5.5, H&H 13 and 37.4.  Platelet count is 99.  Sodium 139,  potassium 3.7, chloride 108, CO2 24, glucose 143, BUN 15, creatinine 1,  bilirubin 0.8, AP 59, AST 29, ALT 20, total protein 6.7 with albumin of 3.6,  calcium 9.1.  Serum ammonia is 49.  Urinalysis reveals 3-6 WBCs and 7-10 RBCs.   ASSESSMENT:  1.  Airianna is a  77 year old Caucasian female with a history of cirrhosis,      possibly cryptogenic __________ NASH with fairly well preserved hepatic      function, who presents  with acute-on-chronic anorexia with early satiety      in the evening and 8-9 pound weight loss over the last 6-8 weeks.  Her      serum ammonia is mildly elevated, but clinically, she does not appear to      be encephalopathic.  Her exam is essentially unremarkable.  I suspect      her symptoms are secondary to depression and dementia; however, peptic      ulcer disease or other etiologies need to be ruled out.  She could also      have Addison's disease.  2.  Recurrent falls secondary to peripheral neuropathy:  I feel she would      benefit from physical therapy for strengthening exercises to her lower      extremities.   RECOMMENDATIONS:  1.  As discussed with Dr. Nobie Putnam, Restoril and Neurontin will be      discontinued.  Will use Lorazepam low dose at bedtime to help prevent      insomnia.  2.  Sed rate, fasting cortisol levels will be checked, along with INR.  3.  She will have INR and alpha fetoprotein.  4.  Agree with the idea of abdominopelvic CT to rule out occult neoplasm.  5.  She will also undergo diagnostic esophagogastroduodenoscopy in the a.m.   We would like to thank Dr. Nobie Putnam for the opportunity to participate in  the care of this nice lady.      NR/MEDQ  D:  11/07/2004  T:  11/07/2004  Job:  [id number]

## 2010-11-22 NOTE — Op Note (Signed)
[facility]  Patient:    [name], [name] Visit Number: [id number] MRN: [id number]          Service Type: END Location: DAY Attending Physician:  Malissa Hippo Dictated by:   Lionel December, M.D. Proc. Date: 07/02/01 Admit Date:  07/02/2001   CC:         Elfredia Nevins, M.D.   Operative Report  PROCEDURE:  Esophagogastroduodenoscopy.  ENDOSCOPIST:  Lionel December, M.D.  INDICATION:  Ms. Corral is a 77 year old Caucasian female with nausea, anorexia, early satiety and sporadic vomiting, who is also suspected to have cirrhosis based on abdominopelvic CT.  She has not responded to PPI, which has controlled her heartburn, but not other symptoms.  After we saw her in the office, we checked her LFTs, which were normal.  Hepatitis B and C markers were also negative and her PT is slightly elevated at 16.7.  She is undergoing EGD primarily to evaluate her symptoms of early satiety, anorexia, nausea and sporadic vomiting.  Procedure and risks were reviewed with the patient and informed consent was obtained.  PREOPERATIVE MEDICATIONS:  Cetacaine spray for oropharyngeal topical anesthesia, Demerol 20 mg IV, Versed 4 mg IV in divided dose.  INSTRUMENT:  Olympus video system.  FINDINGS:  Procedure performed in endoscopy suite.  Patients vital signs and O2 saturations were monitored during the procedure and remained stable. Patient was placed in the left lateral decubitus position and endoscope was passed via oropharynx without any difficulty into the esophagus.  Esophagus:  Mucosa of the esophagus was normal throughout.  There was no evidence of esophageal varices, ring or hernia.  Stomach:  It was empty and distended very well with insufflation.  Folds in the proximal stomach were normal.  Examination of the mucosa at gastric body, antrum and pyloric channel as well as angularis and fundus was normal.  No fundal varices were identified.  Duodenum:   Examination of the bulb revealed normal mucosa.  Scope was passed into the second part and the duodenal mucosa and folds were normal.  Endoscope was withdrawn.  Patient tolerated the procedure well.  IMPRESSION:  Normal esophagogastroduodenoscopy.  RECOMMENDATIONS:  Prothrombin time will be repeated today along with a sed rate and ANA; we will also schedule her for upper abdominal ultrasound, looking for cholelithiasis.  I will be contacting patient with the results of these studies and further recommendations.  Dictated by:   Lionel December, M.D.  Attending Physician:  Malissa Hippo DD:  07/02/01 TD:  07/02/01 Job: [id number]

## 2010-11-22 NOTE — H&P (Signed)
[name], [name]               ACCOUNT NO.:  [id number]   MEDICAL RECORD NO.:  [id number]          PATIENT TYPE:  INP   LOCATION:  A227                          FACILITY:  [facility]   PHYSICIAN:  Corrie Mckusick, M.D.  DATE OF BIRTH:  1923/01/25   DATE OF ADMISSION:  07/31/2005  DATE OF DISCHARGE:  LH                                HISTORY & PHYSICAL   ADMITTING DIAGNOSIS:  1.  Hypokalemia.  2.  Fatigue.  3.  Neuropathy.  4.  Urinary tract infection.   PRIMARY PHYSICIAN:  Madelin Rear. Sherwood Gambler, M.D.   HISTORY OF PRESENT ILLNESS:  An 77 year old female with a long-standing  history of peripheral neuropathy, history of cirrhosis, hypothyroidism,  stress and urinary incontinence, and recurrent UTIs, who presents with  hypokalemia, fatigue, worsening of peripheral adenopathy and UTI.  The  patient was seen by Dr. Sherwood Gambler, primary physician on the day prior to  admission with complaints of near syncope and falling quite a bit.  She had  had 2-3 episodes daily for the preceding week.  He had set up a neurology  consult, which had not been obtained yet.  He had also changed her Neurontin  to Lyrica for peripheral neuropathy.  She was also found to have dysuria,  frequency and urgency, as well as an infected urinalysis in the office on  July 30, 2005, and was started on Levaquin.  He also set up for her see  Dr. Rito Ehrlich on August 18, 2005 for her recurrent UTIs.   In the emergency department, she was found to be significantly hypokalemic  and overall weak.  Cardiovascular and respiratory review of systems  otherwise negative.   PAST MEDICAL HISTORY:  1.  Diabetes, noninsulin-dependent.  2.  GERD.  3.  Cryptogenic cirrhosis.  4.  Organic brain syndrome.  5.  Hypothyroidism.  6.  Recurrent UTIs.  7.  History of stress and urge incontinence.   PAST SURGICAL HISTORY:  1.  Status post hysterectomy in 1967 and appendectomy at that same time.  2.  Ophthalmic surgery in the past.   ALLERGIES:  1.  ROZEREM.  2.  SULFA.  3.  AMBIEN.  4.  REGLAN.   MEDICATIONS ON ADMISSION:  1.  Synthroid 75 mcg daily.  2.  Restoril 30 mg p.o. q.h.s.  3.  Actonel 5 mg daily.  4.  Prevacid 30 mg p.o. b.i.d.  5.  Aricept 5 mg daily.  6.  Recent start of Lyrica 50 mg t.i.d.  7.  Recent start of Levaquin for urinary tract infection.   SOCIAL HISTORY:  Does not drink or smoke.  Has a supportive family.   FAMILY HISTORY:  Overall noncontributory.  There is long history of diabetes  and hypertension.   PHYSICAL EXAMINATION ON ADMISSION:  VITAL SIGNS:  Temperature 97.9, pulse  60, respirations 20, blood pressure 109/59.  GENERAL:  When I saw the patient, she was sitting up, talkative, and just  eating dinner, in no acute distress.  HEENT:  Nasopharynx clear with moist mucous membranes.  NECK:  Supple.  No lymphadenopathy.  No thyromegaly.  CHEST:  Clear to auscultation bilaterally.  CARDIOVASCULAR:  Rate and rhythm normal.  S1 and S2.  No S3 or S4.  No  murmurs, gallops, or rubs.  ABDOMEN:  Soft, nontender.  Flanks with mild pain bilaterally.  There is  some suprapubic tenderness.  The abdominal exam is otherwise unremarkable.  EXTREMITIES:  No edema.   LABORATORY DATA:  White count 9.4, hematocrit 35.5, platelets 133.  Sodium  145, potassium 2.5, chloride 106, bicarbonate 30, glucose 101, BUN 13,  creatinine 1.0.  Urinalysis 1.015 specific gravity, pH of 7.5.  Positive for  nitrates.  Negative leukocytes.  The patient is on antibiotics, given by Dr.  Sherwood Gambler.  Acute abdominal series with chest x-ray was negative in the  emergency department.   ASSESSMENT AND PLAN:  An 77 year old with a history of stress and urge  incontinence, hypothyroidism, GERD, cryptogenic cirrhosis, organic brain  syndrome, who presents with hypokalemia and recurrent urinary tract  infection.  She also has had spells of what sounds like near syncope, and  was going to be evaluated as an outpatient.  She  also reports to me now that  she had fallen months ago and had hurt her right ankle, but has not had that  x-rayed.   PLAN:  1.  Admit to 2A telemetry for close monitoring due to hypokalemia.  2.  Aggressive potassium replacement.  3.  Continue fluids for at least overnight.  4.  Work on nutrition status.  5.  Physical therapy to get involved.  6.  Go ahead and consult neurology and Dr. Rito Ehrlich, as they were going to      be consulted as an outpatient.  7.  Cover with Levaquin 500 mg IV daily.  8.  Continue current medications.      Corrie Mckusick, M.D.  Electronically Signed     JCG/MEDQ  D:  07/31/2005  T:  07/31/2005  Job:  [id number]   cc:   Madelin Rear. Sherwood Gambler, MD  Fax: [phone]

## 2010-11-22 NOTE — Procedures (Signed)
NAMETAMMI, BOULIER NO.:  [id number]   MEDICAL RECORD NO.:  [id number]          PATIENT TYPE:  OUT   LOCATION:  RAD                           FACILITY:  [facility]   PHYSICIAN:  Darlin Priestly, MD  DATE OF BIRTH:  [birth year]   DATE OF PROCEDURE:  04/12/2004  DATE OF DISCHARGE:                                  ECHOCARDIOGRAM   HISTORY:  Ms. Mcroberts is a 77 year old female, patient of Dr. Madelin Rear.  Fusco with a history of syncope.  The patient is now referred for a 2-D  echocardiogram to evaluate LV function and valvular structures.   FINDINGS:  1.  The aorta is within normal limits at 2.6 cm.  2.  The left atrium is within normal limits at 3.9 cm.  3.  There are no clots seen.  4.  The patient was in sinus rhythm during the procedure.  5.  IVS __and Posterior wall  within normal limits at 1.1 and 1.0 cm      respectively.  6.  The aortic valve was mildly thickened, with no evidence of significant      areas of stenosis, and mild aortic regurgitation.  7.  The mitral valve leaflets are mildly thickened with no evidence of      significant mitral stenosis.  There is trivial mitral regurgitation.  8.  Obstruction on the tricuspid valve with mild tricuspid regurgitation.  9.  Left ventricular internal dimensions are within normal limits at 3.1 and      2.1 cm respectively.  There is good overall left ventricular function,      estimated 60%, with no segmental wall motion abnormalities visualized.  10. Normal RV size and systolic function.   CONCLUSION:  1.  Normal LV size and systolic function with estimated EF of 60%.  2.  Mildly thickened aortic valve with no evidence of significant area of      stenosis, and mild aortic regurgitation.  3.  Mildly thickened mitral valve leaflets with trivial mitral      regurgitation.  4.  Structurally normal tricuspid valve with mild tricuspid regurgitation.  5.  Normal RV size and systolic function.     Robe  RHM/MEDQ   D:  04/12/2004  T:  04/12/2004  Job:  [id number]   cc:   Madelin Rear. Sherwood Gambler, MD  [address]  [place]  [place] [address]  Fax: [phone]

## 2010-11-22 NOTE — H&P (Signed)
NAME:  [name], [name]                         ACCOUNT NO.:  [id number]   MEDICAL RECORD NO.:  [id number]                   PATIENT TYPE:  AMB   LOCATION:  DAY                                  FACILITY:  [facility]   PHYSICIAN:  Lazaro Arms, M.D.                DATE OF BIRTH:  03/21/1923   DATE OF ADMISSION:  05/24/2002  DATE OF DISCHARGE:                                HISTORY & PHYSICAL   HISTORY OF PRESENT ILLNESS:  The patient is a 77 year old white female,  gravida 8, para 7, abortus 1, who is status post a hysterectomy and right  salpingo-oophorectomy.  She has been followed for the last couple of years  with a left adnexal mass which appears to me to be a hydrosalpinx.  The  patient is asymptomatic with it.  Dr. Kate Sable has been her physician over  the years.  She has had normal CA-125s.  Dr. Kate Sable has asked me to see  her because of the persistence of the mass.  Dr. Kate Sable and I both talked  it over with the patient and she decided that she would rather have it  removed than to go on with such close monitoring and worrying about it being  malignant.  She understands that it is unlikely to be but that is possible.  As a result, she is admitted for a laparoscopic LSO.   Additionally, the patient has, according to Dr. Karilyn Cota, cryptogenic  cirrhosis.  She has had an evaluation in January for that.  She had an  elevated PT at that time of 16.7 and only a minimally elevated PTT.  Her  hepatitis studies were negative.  Since I am doing this, Dr. Karilyn Cota has  asked me to perform a liver biopsy if appropriate.  As a result, Dr.  Malvin Johns is contacted and we will perform a laparoscopically directed liver  biopsy.   PAST MEDICAL HISTORY:  1. Hypothyroidism.  2. Gastroesophageal reflux.  3. Cryptogenic cirrhosis.   PAST SURGICAL HISTORY:  TH/RSO.   PAST OBSTETRICAL HISTORY:  She is a G8, P7.   SOCIAL HISTORY:  She does not smoke or drink.  She is retired and is very  self-sufficient.   MEDICATIONS:  1. Synthroid 75 mcg.  2. Neurontin 100 mg q.i.d.  3. Prevacid 30 daily.  4. Aricept 5 mg daily.  5. Restoril 30 mg at night for sleep.   PHYSICAL EXAMINATION:  VITAL SIGNS:  Weight 146 pounds, blood pressure  140/80.  HEENT:  Unremarkable.  NECK:  Thyroid is normal.  LUNGS:  Lungs are clear.  HEART:  Heart is regular rate and rhythm without murmur, regurgitation, or  gallop.  BREASTS:  Without masses or discharge or skin changes.  ABDOMEN:  Benign.  No hepatosplenomegaly or masses.  PELVIC:  She has normal external genitalia.  Cervix was without discharge,  it is somewhat atrophic.  Adnexa is negative.  EXTREMITIES:  Warm with no edema.  NEUROLOGIC:  Grossly intact.   IMPRESSION:  1. Left adnexal mass probably hydrosalpinx.  2. Cryptogenic cirrhosis.   PLAN:  The patient is admitted for laparoscopic left salpingo-oophorectomy  and liver biopsy.  She understands the risks and benefits, indications and  alternatives and will proceed.                                               Lazaro Arms, M.D.    Loraine Maple  D:  05/24/2002  T:  05/24/2002  Job:  [id number]

## 2010-11-22 NOTE — Discharge Summary (Signed)
NAMEVIVIANNA, [name]               ACCOUNT NO.:  [id number]   MEDICAL RECORD NO.:  [id number]          PATIENT TYPE:  INP   LOCATION:  A330                          FACILITY:  [facility]   PHYSICIAN:  Madelin Rear. Sherwood Gambler, MD  DATE OF BIRTH:  1923-04-17   DATE OF ADMISSION:  09/10/2005  DATE OF DISCHARGE:  03/13/2007LH                                 DISCHARGE SUMMARY   DISCHARGE MEDICATIONS:  1.  Actonel 5 mg p.o. daily.  2.  Gabapentin 100 mg p.o. 4 times a day.  3.  Triazolam 0.25 mg p.o. nightly p.r.n. sleep.  4.  Reglan 5 mg p.o. t.i.d.  5.  Detrol LA 4 mg p.o. daily.  6.  Synthroid 75 mcg p.o. daily.  7.  Aricept 5 mg daily.  8.  Fludrocortisone 0.1 mg p.o. daily.  9.  Prevacid 30 mg p.o. daily.   DISCHARGE DIAGNOSES:  1.  Osteoporosis.  2.  Overactive bladder.  3.  Insomnia.  4.  Peripheral neuropathy.  5.  Organic brain syndrome.  6.  Hypothyroidism.  7.  Gastroenteritis.  8.  Mild dehydration.   SUMMARY:  The patient was admitted for chronic anorexia, abdominal pain,  nausea, and vomiting.  She was found to have no discriminatory physical  findings or laboratory findings.  She did respond to IV hydration.  It was  possible lactulose was causing some nausea.  She subsequently improved in-  house and was discharged for followup as an outpatient setting.  She did  require some repletion of potassium which seemed to help quite a bit as  well.  A CT of the abdomen and pelvis showed mild cirrhosis, which is  chronic and stable, a few perisplenic varices, and a few diverticula.  On  the day of discharge, she was improved and stable.  Follow up in office.      Madelin Rear. Sherwood Gambler, MD  Electronically Signed     LJF/MEDQ  D:  11/11/2005  T:  11/12/2005  Job:  [id number]

## 2010-11-22 NOTE — Consult Note (Signed)
NAMEVALLEY, KE               ACCOUNT NO.:  [id number]   MEDICAL RECORD NO.:  [id number]          PATIENT TYPE:  INP   LOCATION:  A218                          FACILITY:  [facility]   PHYSICIAN:  J. Darreld Mclean, M.D. DATE OF BIRTH:  11-30-1922   DATE OF CONSULTATION:  05/13/2005  DATE OF DISCHARGE:                                   CONSULTATION   CHIEF COMPLAINT:  My back hurts.   HISTORY OF PRESENT ILLNESS:  The patient is an 77 year old female who has  had pain and tenderness in her low back and mid thoracic back and scapulary  of her back since May.  She denies any falls, trauma or unusual injuries.  She said she had a lot of nausea and vomiting and heaving, and says the pain  developed then.  The pain is intermittent.  It comes and goes.  Some days it  hurts.  Other days it does not hurt as bad.  While here in the hospital, she  has had a CT scan of her back, lumbar spine and thoracic spine essentially  negative without any fractures, herniated nucleus pulposus.  She does have  significant degenerative joint disease and changes related to that.   Neurologically, she is intact.  She has no paresthesia, reflexes are normal,  straight leg raising is normal.  No spasms.  She has slight tenderness in  the lower scapulary on the left back.  She has no spasms.  Range of motion  is good.  She can arise and sit in the bed slowly, but well.  She has other  significant medical problems.  She has non-insulin-dependent diabetes  mellitus, chronic abdominal discomfort, reflux disease, cirrhosis of the  liver.  She has mild organic brain syndrome.   IMPRESSION:  Chronic back pain, mid thoracic pain, scapular pain on the  left.   PLAN:  Begin physical therapy, occupational therapy, ultrasound.  Need  exercises.  This can be arranged as an outpatient as well.  I will see her  in the office one week from today on November 14 at 1:30.  Orders have been  written.     ______________________________  Shela Commons. Darreld Mclean, M.D.     JWK/MEDQ  D:  05/13/2005  T:  05/13/2005  Job:  [id number]

## 2010-11-22 NOTE — Consult Note (Signed)
NAMECYNTHIE, GARMON               ACCOUNT NO.:  [id number]   MEDICAL RECORD NO.:  [id number]          PATIENT TYPE:  INP   LOCATION:  A218                          FACILITY:  [facility]   PHYSICIAN:  R. Roetta Sessions, M.D. DATE OF BIRTH:  1923-05-29   DATE OF CONSULTATION:  05/07/2005  DATE OF DISCHARGE:                                   CONSULTATION   REASON FOR ADMISSION:  Chronic abdominal pain, weight loss and anorexia.   HISTORY OF PRESENT ILLNESS:  The patient is an 77 year old Caucasian female  with history of cirrhosis, possibly cryptogenic versus NASH, who has had  preserved hepatic function, chronic abdominal pain, chronic anorexia, who  was admitted to the hospital with chest pain and urosepsis. We have been  following the patient for years for the above symptoms. She actually has not  had any documented significant weight loss in the past 1 year. Her weight 1  year ago was 135 and today she weighed 132 pounds. She was admitted due to  urosepsis. She has been treated as an outpatient for urinary tract infection  without any relief. She complains of progressive bilateral flank pain, more  on the left than the right side. She has been having radiation of pain into  her chest and complains of shortness of breath, pain with deep breathing.  She complains of fever and chills. She was seen in Dr. Sharyon Medicus office today  and was directly admitted to the hospital with chest pain and urosepsis. The  patient states she continues to have chronic nausea. She really does not  have a lot of vomiting but does have and sometimes brings up some phlegm.  She complains of her abdomen being mildly sore all over. Really nothing  changed from her baseline. She is having left flank pain more than right  flank pain. She has a 2-3 week history of dysuria and has been on  antibiotics as an outpatient which has failed to treat this. For a couple of  days she has had some difficulty with heart burn. Denies  any dysphagia,  odynophagia. She does not have much of an appetite. In May of this year she  was in the hospital with anorexia, early satiety and weight loss. She had a  normal fasting a.m. cortisol level and a normal AFP. A CT of the abdomen and  pelvis at that time revealed mild cirrhosis with a few small perisplenic  varices and a few diverticula. She had a negative head CT as well.   On admission her hemoglobin and hematocrit are normal. Her potassium was  2.8. Liver function tests unremarkable except albumin of 3.3. Cardiac  enzymes x1 negative.  D-dimer negative.   MEDICATIONS PRIOR TO ADMISSION:  1.  Synthroid 75 mcg daily.  2.  Prevacid 30 mg daily.  3.  Aricept 5 mg daily.  4.  Actonel.  5.  Florinef.  6.  Roserem 8 mg q.h.s.  7.  Nexium 40 mg daily.  8.  Glucotrol XL p.r.n.  9.  ICaps 2 daily.   ALLERGIES:  SULFA, AMBIEN, CODEINE, REGLAN.  PAST MEDICAL HISTORY:  1.  Cirrhosis diagnosed November, 2002 with abnormal abdominal/pelvic CT.      Her liver contour suggested cirrhosis but no splenomegaly. She has had      intermittent elevated transaminases. Previously has had mildly elevated      ammonia level. Work up included negative hepatitis B surface antigen,      hepatitis C antibody, negative ANA and negative AMA. She has not had any      evidence of varices on EGD, last one in May, 2006. Liver biopsy a few      years ago at time of laparoscopy for left adnexal mass. The mass turned      out to be a benign ovarian cyst. Liver biopsy confirmed cirrhosis with      mild inflammation but no specific features to suggest etiology.  2.  She also has diabetes mellitus.  3.  Hypothyroidism.  4.  Alzheimer's disease.  5.  Osteoporosis.  6.  History of kidney stones.  7.  Chronic gastroesophageal reflux disease.  8.  Peripheral neuropathy.  9.  Psoriasis.  10. Status post appendectomy.  11. Bilateral cataract surgery.  12. Hysterectomy with right oophorectomy several  years ago. The left ovary      was removed in November, 2003 as outlined above.  13. She had a CT angiography May 05, 2005 which was unremarkable.  14. Last EGD in May, 2006 revealed mild antral gastritis, single erosion,      small gastric AVM.   FAMILY HISTORY:  Noncontributory.   SOCIAL HISTORY:  She is a widow. She has two sons living. One daughter died  of leukemia at age 56. Another daughter died a few years ago in her 34's. The  rest of her children died at or soon after birth. She does not smoke  cigarettes and never drank alcohol. 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 for a few years. She lives alone. Her  daughter-in-law is a CNA and takes care of her in an official capacity.   REVIEW OF SYSTEMS:  See HPI for GI and CARDIOPULMONARY. CONSTITUTIONAL:  Complains of weight loss although this has not been well documented. Over  the last 12 year she has only down about 2 pounds.   PHYSICAL EXAMINATION:  Height 64 inches, weight 132 (she weighed 125 in May,  2006). Temperature 98.3, pulse 78, respirations 20, blood pressure 140/59.  GENERAL:  Pleasant, elderly Caucasian female in no acute distress. She is  surrounded by family members.  SKIN:  Warm and dry, no jaundice.  NEURO:  She is alert and oriented.  HEENT:  Conjunctivae are pink, sclerae non icteric. Oropharyngeal mucosa  moist and pink, no lesions, erythema or exudate.  NECK:  No lymphadenopathy or thyromegaly.  CHEST:  Lungs were clear to auscultation.  CARDIAC EXAM:  Revealed regular rate and rhythm, no murmurs, rubs, or  gallops.  ABDOMEN:  Positive bowel sounds, soft, nondistended. Abdomen is nontender.  She has some mild tenderness in the left flank, no organomegaly or masses.  No rebound tenderness or guarding. No abdominal bruits, hernias.  EXTREMITIES:  No edema.   LABORATORY DATA:  White count 6700, hemoglobin 12.8, hematocrit 36.6, platelets 118,000. D-dimer 0.22.   Potassium 2.8, sodium 141, BUN 16, creatinine 1, glucose 93, total bilirubin  0.8, alkaline phosphatase 95, SGOT 28, SGPT 17, albumin 3.3.   IMPRESSION:  The patient is an 77 year old Caucasian female with history of  diabetes  mellitus, cryptogenic cirrhosis, dementia, who was admitted with  chest pain and urosepsis. From a GI stand-point she has had chronic  anorexia, chronic nausea, chronic abdominal pain and reported weight loss.  Her weight loss has not been documented over the last 1 year (her weight has  been very stable). We have felt that her chronic nausea and anorexia is  related to a central origin such as her dementia. She has had multiple  studies done in the past. Most recently, EGD and CT of the abdomen and  pelvis in May, 2006 did not reveal any findings to explain her symptoms. She  does have mild cirrhosis based on CT but no esophageal or gastric varices.  Recent CT angiography was also unremarkable. She has had a normal cortisol  a.m. level and alpha fetoprotein a few months ago.   RECOMMENDATIONS:  1.  Agree with treating of urosepsis as planned.  2.  Would continue supportive measures regarding GI symptoms.  3.  Hypokalemia being addressed by cardiology and primary care physician.  4.  Will continue to follow patient with you during this hospitalization and      will be happy to continue follow up as an outpatient for her chronic GI      symptoms.      Tana Coast, P.AJonathon Bellows, M.D.  Electronically Signed    LL/MEDQ  D:  05/07/2005  T:  05/07/2005  Job:  [id number]

## 2010-11-22 NOTE — Discharge Summary (Signed)
NAMEEUDELL, Melissa NO.:  [id number]   MEDICAL RECORD NO.:  [id number]          PATIENT TYPE:  INP   LOCATION:  A227                          FACILITY:  [facility]   PHYSICIAN:  [name], M.D.  DATE OF BIRTH:  1923-02-13   DATE OF ADMISSION:  07/31/2005  DATE OF DISCHARGE:  01/30/2007LH                                 DISCHARGE SUMMARY   HISTORY OF PRESENT ILLNESS/PAST MEDICAL HISTORY:  Please see Admission H&P.   HOSPITAL COURSE:  An 77 year old female with history of stress and urge  incontinence, hypothyroidism, GERD, __________, organic brain syndrome, who  presented with hypokalemia and recurrent UTI.  She has also had spells of  what sounds like near syncope and was going to be evaluated as an  outpatient, and this was done here in the hospital.  She was admitted to  telemetry for close monitoring due to hypokalemia.  Progressive potassium  placement was performed which helped quite a bit.  IV fluids were used for  rehydration.  Dr. Rito Ehrlich was consulted for the recurrent UTI's and  incontinence.  Please see his note for details.   The day after admission, the patient had improved quite a bit.  Potassium  had come up to 4.1.  IV antibiotics, which was Levaquin, was continued.  Physical therapy was consulted.  TSH was added and was normal.   The patient continued to improve on a daily basis.  Her weakness continued  to improve.  Due to the near syncope and the weakness in the neck pain, we  decided to get an MRI of both the brain and the C-spine.  Both of which were  essentially normal with some questionable B-12 deficiency seen with  questionable Wernicke's encephalopathy on MRI of the brain.  B-12 was added  and is still pending at this time. Otherwise, Dr. 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  Dr. Rito Ehrlich suggested adding Detrol and Macrodantin 50 mg on a daily basis  for  prophylaxis.  The patient had improved greatly and was ready for  discharge on January 30.   DISCHARGE PHYSICAL EXAMINATION:  VITAL SIGNS:  T-max 98.9, blood pressure  118/43, pulse 70, respirations 20.  GENERAL APPEARANCE:  She is pleasant, talkative, joking.  No acute distress.  HEENT:  Negative.  Pharynx clear with mucous membranes moist.  NECK:  Supple.  No lymphadenopathy.  CHEST:  Clear to auscultation bilaterally.  CARDIOVASCULAR:  Regular rate and rhythm.  Normal S1, S2.  ABDOMEN:  Soft, nontender.  EXTREMITIES:  No edema.   DISCHARGE MEDICATIONS:  Same as admission with the addition of:  1.  Macrodantin 50 mg q.h.s. for one month.  2.  Detrol LA 4 mg p.o. daily for one month.  3.  Levaquin 500 mg p.o. an additional four days.   FOLLOWUP:  Follow up with Dr. Rito Ehrlich in two weeks.  Follow up at [facility] in 1-2 weeks.   CONDITION ON DISCHARGE:  Improved and stable.   PLAN:  Will also set up for Home  Health and PT.      [name], M.D.  Electronically Signed     JCG/MEDQ  D:  08/05/2005  T:  08/05/2005  Job:  [id number]

## 2010-11-22 NOTE — Consult Note (Signed)
[name], [name]               ACCOUNT NO.:  [id number]   MEDICAL RECORD NO.:  [id number]          PATIENT TYPE:  INP   LOCATION:  A330                          FACILITY:  [facility]   PHYSICIAN:  R. Roetta Sessions, M.D. DATE OF BIRTH:  March 29, 1923   DATE OF CONSULTATION:  09/11/2005  DATE OF DISCHARGE:                                   CONSULTATION   REASON FOR CONSULTATION:  Chronic anorexia, nausea and vomiting.   REQUESTING PHYSICIAN:  [name], M.D.   HISTORY OF PRESENT ILLNESS:  The patient is an 77 year old Caucasian female  with history of cirrhosis, possibly cryptogenic versus NASH with preserved  hepatic function, chronic anorexia, chronic nausea with intermittent  vomiting. She has extensively evaluated over the last one to two years for  these symptoms. She has been admitted with recurrent/persistent symptoms.  The patient was in the hospital from January 25 to August 05, 2005 for UTI.  She was home for three days and returned for another four-day hospital stay  for nausea and vomiting. It was felt that it was most likely was due to  nitrofurantoin at the time. She says she has been home for about a month  now. She continues to have very poor appetite. She is forcing herself to  eat. She wakes up in the morning with nausea. Over the last three days, her  symptoms have been escalating. Yesterday, she vomited several times. She  denies any abdominal pain, heartburn, dysphagia, odynophagia, cough,  shortness of breath, fever or chills, constipation, diarrhea, melena or  rectal bleeding. She has been feeling very shaky and says her neck is weak.  She takes Vicodin for chronic pain and says that this helps the shakes and  weakness as well. She does not associate any nausea related to the Vicodin  and states she actually feels better after taking the medication. She is  having great difficulty with sleep. The only time she sleeps is if she takes  triazolam. She admits to  a lot of anxiety and worry.   Her white count is 5200, hemoglobin 11.5, hematocrit 33.9, platelets  110,000. Potassium was 2.7 on admission - 4.3 today, BUN 8, creatinine 1,  glucose 94. Total bilirubin 0.6, alkaline phosphatase 55, AST 30, ALT 20,  albumin 3.2, lipase 35, calcium 8.8, sodium 138. Her urinalysis is negative.  In January, she had a MRI of the brain which revealed abnormal signal in the  hypothalamus and medial anterior thalami, question secondary to B12  deficiency. August 01, 2005, she had a CT without contrast that revealed a  single 1 x 2 mm left renal stone and sigmoid diverticulosis. August 11, 2005, abdominal ultrasound was unremarkable.   In May 2006, during a hospitalization, she had normal fasting a.m. cortisol  level and normal AFP. CT of the abdomen and pelvis at that time revealed  mild cirrhosis with a few small perisplenic varices and a few diverticula.  Her last EG was in May 2006 revealing mild antral gastritis, single erosion,  small gastric AVM.   MEDICATIONS AT HOME:  1.  Synthroid 75  mcg daily.  2.  Prevacid 30 mg daily.  3.  Aricept 5 mg daily.  4.  Actonel 5 mg daily.  5.  Florinef 0.1 mg daily.  6.  Triazolam 0.25 mg at bedtime.  7.  Detrol 4 mg daily.  8.  Vicodin 7.5/650 mg b.i.d.  9.  Neurontin 100 mg q.i.d.  10. Reglan 5 mg t.i.d.  11. ICaps 2 daily.   ALLERGIES:  SULFA, AMBIEN, CODEINE. She previously listed Reglan as an  allergy but states she has been tolerating it lately.   PAST MEDICAL HISTORY:  1.  Cirrhosis diagnosed November 2002 with abnormal abdominal/pelvic CT.      Liver contour suggested cirrhosis but no splenomegaly. She has had      intermittent elevated transaminases. Previously mildly elevated ammonia      level. Workup included negative hepatitis B surface antigen, hepatitis C      antibody, negative ANA and AMA. No varices on EGD in 2006. Liver biopsy      years ago at time of laparoscopy for left adnexa mass  which turned out      to be benign ovarian cyst revealed cirrhosis with mild inflammation but      no specific features to suggest etiology.  2.  Diabetes mellitus.  3.  Hypothyroidism.  4.  Alzheimer's disease.  5.  Osteoporosis.  6.  History of nephrolithiasis.  7.  Chronic GERD.  8.  Peripheral neuropathy.  9.  Psoriasis.  10. Recurrent urinary tract infections.  11. Status post appendectomy.  12. Bilateral cataract extraction.  13. Hysterectomy with right oophorectomy and subsequently had left      oophorectomy November 2003.  14. CT angiography October 2006 unremarkable.   FAMILY HISTORY:  Noncontributory.   SOCIAL HISTORY:  She is widowed, has two living sons. One daughter died of  leukemia, age 35. Another daughter died a few years ago in her 50s. The rest  of her children died at or soon after birth. She does not smoke cigarettes.  Never drank alcohol. 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 She lives alone. Her daughter-in-law is a CNA  and takes care of her in an official capacity.   REVIEW OF SYSTEMS:  GASTROINTESTINAL:  See HPI for GI. CONSTITUTIONAL:  No  reported weight loss. CARDIOPULMONARY:  No chest pain or shortness of  breath, cough.   PHYSICAL EXAMINATION:  VITAL SIGNS:  Current weight unavailable, but from  November to February, her weight was constant at 126. Temperature 97.8,  pulse 74, respirations 20, blood pressure 122/54.  GENERAL:  Pleasant, elderly Caucasian female in no acute distress. She is  accompanied by her son-in-law.  SKIN:  Warm and dry. No jaundice.  HEENT:  Conjunctivae are pink. Sclerae are nonicteric. Oropharyngeal mucosa  moist and pink. No lesions, erythema or exudate. No lymphadenopathy or  thyromegaly.  CHEST:  Lungs are clear to auscultation.  CARDIAC:  Reveals regular rate and rhythm. Normal S1 and S2. No murmurs,  rubs, or gallops. ABDOMEN:  Positive bowel sounds, soft, nontender,  nondistended. No  organomegaly or masses. No rebound tenderness or guarding. No abdominal  bruits or hernias.  EXTREMITIES:  No edema.   LABORATORY DATA:  As mentioned in HPI.   IMPRESSION:  The patient is an 77 year old Caucasian female with history of  diabetes mellitus, cryptogenic cirrhosis (well preserved hepatic function),  dementia who is admitted with recurrent/persistent chronic anorexia with  chronic nausea  and intermittent vomiting. Multiple studies have been  unremarkable in the past. See above for prior work up. We have felt that her  chronic nausea and anorexia is related to a central organ such as her  dementia or even depression.   RECOMMENDATIONS:  I agree with antidepressant therapy with appetite  stimulant. Continue supportive measures. We will discuss further with Dr.  Jena Gauss and make further recommendations.      Tana Coast, P.AJonathon Bellows, M.D.  Electronically Signed    LL/MEDQ  D:  09/11/2005  T:  09/11/2005  Job:  [id number]

## 2010-12-08 ENCOUNTER — Other Ambulatory Visit: Payer: Self-pay | Admitting: Gastroenterology

## 2011-02-05 ENCOUNTER — Encounter: Payer: Self-pay | Admitting: General Practice

## 2011-02-11 ENCOUNTER — Other Ambulatory Visit (HOSPITAL_COMMUNITY): Payer: Self-pay | Admitting: Orthopaedic Surgery

## 2011-02-11 DIAGNOSIS — M25569 Pain in unspecified knee: Secondary | ICD-10-CM

## 2011-02-13 ENCOUNTER — Ambulatory Visit (HOSPITAL_COMMUNITY)
Admission: RE | Admit: 2011-02-13 | Discharge: 2011-02-13 | Disposition: A | Discharge status: Home / Self Care | Payer: PRIVATE HEALTH INSURANCE | Source: Ambulatory Visit | Attending: Orthopaedic Surgery | Admitting: Orthopaedic Surgery

## 2011-02-13 DIAGNOSIS — M25469 Effusion, unspecified knee: Secondary | ICD-10-CM | POA: Insufficient documentation

## 2011-02-13 DIAGNOSIS — R937 Abnormal findings on diagnostic imaging of other parts of musculoskeletal system: Secondary | ICD-10-CM | POA: Insufficient documentation

## 2011-02-13 DIAGNOSIS — IMO0002 Reserved for concepts with insufficient information to code with codable children: Secondary | ICD-10-CM | POA: Insufficient documentation

## 2011-02-13 DIAGNOSIS — M1710 Unilateral primary osteoarthritis, unspecified knee: Secondary | ICD-10-CM | POA: Insufficient documentation

## 2011-02-13 DIAGNOSIS — M25569 Pain in unspecified knee: Secondary | ICD-10-CM | POA: Insufficient documentation

## 2011-04-03 LAB — URINALYSIS, ROUTINE W REFLEX MICROSCOPIC
Glucose, UA: NEGATIVE
Ketones, ur: NEGATIVE
Leukocytes, UA: NEGATIVE
Specific Gravity, Urine: 1.01
pH: 5.5

## 2011-04-03 LAB — COMPREHENSIVE METABOLIC PANEL
ALT: 19
AST: 34
Calcium: 9.2
Creatinine, Ser: 1.1
GFR calc Af Amer: 58 — ABNORMAL LOW
Glucose, Bld: 109 — ABNORMAL HIGH
Sodium: 144
Total Protein: 6.5

## 2011-04-03 LAB — CBC
MCHC: 35.6
RDW: 13

## 2011-04-03 LAB — URINE MICROSCOPIC-ADD ON

## 2011-04-16 LAB — URINALYSIS, ROUTINE W REFLEX MICROSCOPIC
Glucose, UA: NEGATIVE
Hgb urine dipstick: NEGATIVE
Specific Gravity, Urine: 1.025
Urobilinogen, UA: 0.2

## 2011-04-16 LAB — HEPATIC FUNCTION PANEL
ALT: 28
Albumin: 3.4 — ABNORMAL LOW
Alkaline Phosphatase: 97
Indirect Bilirubin: 0.7
Total Protein: 7

## 2011-04-16 LAB — DIFFERENTIAL
Lymphs Abs: 1.1
Monocytes Relative: 7
Neutro Abs: 10 — ABNORMAL HIGH
Neutrophils Relative %: 83 — ABNORMAL HIGH

## 2011-04-16 LAB — POCT CARDIAC MARKERS
CKMB, poc: 3.1
Operator id: 211291
Troponin i, poc: <0.05

## 2011-04-16 LAB — BASIC METABOLIC PANEL
Calcium: 9.4
Chloride: 104
Creatinine, Ser: 0.99
GFR calc Af Amer: >60
GFR calc non Af Amer: 54 — ABNORMAL LOW

## 2011-04-16 LAB — CBC
Platelets: 115 — ABNORMAL LOW
RBC: 4.58
WBC: 12.1 — ABNORMAL HIGH

## 2011-04-18 ENCOUNTER — Other Ambulatory Visit: Payer: Self-pay

## 2011-04-18 ENCOUNTER — Inpatient Hospital Stay (HOSPITAL_COMMUNITY)
Admission: EM | Admit: 2011-04-18 | Discharge: 2011-04-28 | DRG: 480 | Disposition: A | Discharge status: Skilled Nursing Facility | Payer: PRIVATE HEALTH INSURANCE | Attending: Family Medicine | Admitting: Family Medicine

## 2011-04-18 ENCOUNTER — Emergency Department (HOSPITAL_COMMUNITY): Payer: PRIVATE HEALTH INSURANCE

## 2011-04-18 ENCOUNTER — Encounter (HOSPITAL_COMMUNITY): Payer: Self-pay | Admitting: Emergency Medicine

## 2011-04-18 DIAGNOSIS — W06XXXA Fall from bed, initial encounter: Secondary | ICD-10-CM | POA: Diagnosis present

## 2011-04-18 DIAGNOSIS — T40605A Adverse effect of unspecified narcotics, initial encounter: Secondary | ICD-10-CM | POA: Diagnosis not present

## 2011-04-18 DIAGNOSIS — G609 Hereditary and idiopathic neuropathy, unspecified: Secondary | ICD-10-CM

## 2011-04-18 DIAGNOSIS — S72009A Fracture of unspecified part of neck of unspecified femur, initial encounter for closed fracture: Secondary | ICD-10-CM

## 2011-04-18 DIAGNOSIS — G8929 Other chronic pain: Secondary | ICD-10-CM | POA: Diagnosis present

## 2011-04-18 DIAGNOSIS — D500 Iron deficiency anemia secondary to blood loss (chronic): Secondary | ICD-10-CM | POA: Diagnosis not present

## 2011-04-18 DIAGNOSIS — D6959 Other secondary thrombocytopenia: Secondary | ICD-10-CM | POA: Diagnosis present

## 2011-04-18 DIAGNOSIS — J189 Pneumonia, unspecified organism: Secondary | ICD-10-CM

## 2011-04-18 DIAGNOSIS — E039 Hypothyroidism, unspecified: Secondary | ICD-10-CM

## 2011-04-18 DIAGNOSIS — R5381 Other malaise: Secondary | ICD-10-CM | POA: Diagnosis not present

## 2011-04-18 DIAGNOSIS — G4700 Insomnia, unspecified: Secondary | ICD-10-CM

## 2011-04-18 DIAGNOSIS — N390 Urinary tract infection, site not specified: Secondary | ICD-10-CM

## 2011-04-18 DIAGNOSIS — K3184 Gastroparesis: Secondary | ICD-10-CM

## 2011-04-18 DIAGNOSIS — R509 Fever, unspecified: Secondary | ICD-10-CM | POA: Diagnosis not present

## 2011-04-18 DIAGNOSIS — R4182 Altered mental status, unspecified: Secondary | ICD-10-CM | POA: Diagnosis not present

## 2011-04-18 DIAGNOSIS — S72143A Displaced intertrochanteric fracture of unspecified femur, initial encounter for closed fracture: Secondary | ICD-10-CM

## 2011-04-18 DIAGNOSIS — K7460 Unspecified cirrhosis of liver: Secondary | ICD-10-CM

## 2011-04-18 DIAGNOSIS — D638 Anemia in other chronic diseases classified elsewhere: Secondary | ICD-10-CM | POA: Diagnosis not present

## 2011-04-18 DIAGNOSIS — E119 Type 2 diabetes mellitus without complications: Secondary | ICD-10-CM | POA: Diagnosis present

## 2011-04-18 DIAGNOSIS — K224 Dyskinesia of esophagus: Secondary | ICD-10-CM | POA: Diagnosis present

## 2011-04-18 DIAGNOSIS — R110 Nausea: Secondary | ICD-10-CM

## 2011-04-18 DIAGNOSIS — F0390 Unspecified dementia without behavioral disturbance: Secondary | ICD-10-CM | POA: Diagnosis present

## 2011-04-18 DIAGNOSIS — F411 Generalized anxiety disorder: Secondary | ICD-10-CM

## 2011-04-18 DIAGNOSIS — S72001A Fracture of unspecified part of neck of right femur, initial encounter for closed fracture: Secondary | ICD-10-CM

## 2011-04-18 DIAGNOSIS — K219 Gastro-esophageal reflux disease without esophagitis: Secondary | ICD-10-CM

## 2011-04-18 DIAGNOSIS — F068 Other specified mental disorders due to known physiological condition: Secondary | ICD-10-CM

## 2011-04-18 DIAGNOSIS — R630 Anorexia: Secondary | ICD-10-CM

## 2011-04-18 DIAGNOSIS — R5383 Other fatigue: Secondary | ICD-10-CM | POA: Diagnosis not present

## 2011-04-18 HISTORY — DX: Unspecified osteoarthritis, unspecified site: M19.90

## 2011-04-18 HISTORY — DX: Essential (primary) hypertension: I10

## 2011-04-18 HISTORY — DX: Depression, unspecified: F32.A

## 2011-04-18 HISTORY — DX: Major depressive disorder, single episode, unspecified: F32.9

## 2011-04-18 LAB — COMPREHENSIVE METABOLIC PANEL
ALT: 15 U/L (ref 0–35)
AST: 31 U/L (ref 0–37)
Albumin: 3 g/dL — ABNORMAL LOW (ref 3.5–5.2)
Alkaline Phosphatase: 121 U/L — ABNORMAL HIGH (ref 39–117)
CO2: 27 mEq/L (ref 19–32)
Chloride: 108 mEq/L (ref 96–112)
GFR calc non Af Amer: 27 mL/min — ABNORMAL LOW (ref >90)
Potassium: 4.4 mEq/L (ref 3.5–5.1)
Sodium: 145 mEq/L (ref 135–145)
Total Bilirubin: 0.7 mg/dL (ref 0.3–1.2)

## 2011-04-18 LAB — DIFFERENTIAL
Basophils Absolute: 0 10*3/uL (ref 0.0–0.1)
Basophils Relative: 0 % (ref 0–1)
Lymphocytes Relative: 7 % — ABNORMAL LOW (ref 12–46)
Monocytes Absolute: 0.6 10*3/uL (ref 0.1–1.0)
Neutro Abs: 6 10*3/uL (ref 1.7–7.7)
Neutrophils Relative %: 82 % — ABNORMAL HIGH (ref 43–77)

## 2011-04-18 LAB — URINALYSIS, ROUTINE W REFLEX MICROSCOPIC
Bilirubin Urine: NEGATIVE
Glucose, UA: NEGATIVE mg/dL
Leukocytes, UA: NEGATIVE
Nitrite: POSITIVE — AB
Specific Gravity, Urine: 1.025 (ref 1.005–1.030)
pH: 5.5 (ref 5.0–8.0)

## 2011-04-18 LAB — CBC
HCT: 42.8 % (ref 36.0–46.0)
MCHC: 32.2 g/dL (ref 30.0–36.0)
RDW: 13.7 % (ref 11.5–15.5)
WBC: 7.3 10*3/uL (ref 4.0–10.5)

## 2011-04-18 LAB — PROTIME-INR: Prothrombin Time: 15.2 seconds (ref 11.6–15.2)

## 2011-04-18 LAB — URINE MICROSCOPIC-ADD ON

## 2011-04-18 LAB — GLUCOSE, CAPILLARY: Glucose-Capillary: 145 mg/dL — ABNORMAL HIGH (ref 70–99)

## 2011-04-18 MED ORDER — POTASSIUM CHLORIDE CRYS ER 10 MEQ PO TBCR
10.0000 meq | EXTENDED_RELEASE_TABLET | Freq: Two times a day (BID) | ORAL | Status: DC
  Administered 2011-04-18 – 2011-04-27 (×18): 10 meq via ORAL
  Filled 2011-04-18 (×18): qty 1

## 2011-04-18 MED ORDER — ENOXAPARIN SODIUM 80 MG/0.8ML ~~LOC~~ SOLN
30.0000 mg | SUBCUTANEOUS | Status: AC
  Administered 2011-04-18 – 2011-04-19 (×2): 30 mg via SUBCUTANEOUS
  Filled 2011-04-18 (×2): qty 0.8

## 2011-04-18 MED ORDER — PANTOPRAZOLE SODIUM 40 MG PO TBEC
40.0000 mg | DELAYED_RELEASE_TABLET | Freq: Every day | ORAL | Status: DC
  Administered 2011-04-18 – 2011-04-28 (×10): 40 mg via ORAL
  Filled 2011-04-18 (×10): qty 1

## 2011-04-18 MED ORDER — PREGABALIN 50 MG PO CAPS
100.0000 mg | ORAL_CAPSULE | Freq: Three times a day (TID) | ORAL | Status: DC
  Administered 2011-04-18 – 2011-04-28 (×30): 100 mg via ORAL
  Filled 2011-04-18 (×9): qty 2
  Filled 2011-04-18: qty 1
  Filled 2011-04-18 (×3): qty 2
  Filled 2011-04-18 (×2): qty 1
  Filled 2011-04-18 (×2): qty 2
  Filled 2011-04-18: qty 1
  Filled 2011-04-18 (×4): qty 2
  Filled 2011-04-18: qty 1
  Filled 2011-04-18 (×4): qty 2
  Filled 2011-04-18 (×2): qty 1
  Filled 2011-04-18 (×3): qty 2

## 2011-04-18 MED ORDER — FENTANYL CITRATE 0.05 MG/ML IJ SOLN
50.0000 ug | Freq: Once | INTRAMUSCULAR | Status: AC
  Administered 2011-04-18: 11:00:00 via INTRAVENOUS
  Filled 2011-04-18: qty 2

## 2011-04-18 MED ORDER — FENTANYL CITRATE 0.05 MG/ML IJ SOLN
50.0000 ug | Freq: Once | INTRAMUSCULAR | Status: AC
  Administered 2011-04-18: 14:00:00 via INTRAVENOUS
  Filled 2011-04-18: qty 2

## 2011-04-18 MED ORDER — INSULIN ASPART 100 UNIT/ML ~~LOC~~ SOLN
0.0000 [IU] | Freq: Every day | SUBCUTANEOUS | Status: DC
  Administered 2011-04-23: 0 [IU] via SUBCUTANEOUS

## 2011-04-18 MED ORDER — HYDROMORPHONE HCL 1 MG/ML IJ SOLN
0.5000 mg | INTRAMUSCULAR | Status: DC | PRN
  Administered 2011-04-18 – 2011-04-24 (×13): 0.5 mg via INTRAVENOUS
  Filled 2011-04-18 (×15): qty 1

## 2011-04-18 MED ORDER — SODIUM CHLORIDE 0.9 % IJ SOLN
3.0000 mL | INTRAMUSCULAR | Status: DC | PRN
  Filled 2011-04-18 (×2): qty 3

## 2011-04-18 MED ORDER — OXYCODONE HCL 5 MG PO TABS
5.0000 mg | ORAL_TABLET | ORAL | Status: DC | PRN
  Administered 2011-04-18 – 2011-04-23 (×9): 5 mg via ORAL
  Filled 2011-04-18 (×11): qty 1

## 2011-04-18 MED ORDER — LEVOTHYROXINE SODIUM 75 MCG PO TABS
75.0000 ug | ORAL_TABLET | Freq: Every day | ORAL | Status: DC
  Administered 2011-04-18 – 2011-04-28 (×11): 75 ug via ORAL
  Filled 2011-04-18 (×11): qty 1

## 2011-04-18 MED ORDER — INSULIN ASPART 100 UNIT/ML ~~LOC~~ SOLN
0.0000 [IU] | Freq: Three times a day (TID) | SUBCUTANEOUS | Status: DC
  Administered 2011-04-18 – 2011-04-19 (×3): 1 [IU] via SUBCUTANEOUS
  Administered 2011-04-20: 2 [IU] via SUBCUTANEOUS
  Administered 2011-04-20: 1 [IU] via SUBCUTANEOUS
  Administered 2011-04-20 – 2011-04-21 (×2): 2 [IU] via SUBCUTANEOUS
  Administered 2011-04-21: 0 [IU] via SUBCUTANEOUS
  Administered 2011-04-21: 2 [IU] via SUBCUTANEOUS
  Administered 2011-04-22: 1 [IU] via SUBCUTANEOUS
  Administered 2011-04-22: 0 [IU] via SUBCUTANEOUS
  Administered 2011-04-23 – 2011-04-24 (×3): 1 [IU] via SUBCUTANEOUS
  Administered 2011-04-24: 3 [IU] via SUBCUTANEOUS
  Administered 2011-04-25 – 2011-04-28 (×6): 1 [IU] via SUBCUTANEOUS
  Filled 2011-04-18: qty 3

## 2011-04-18 MED ORDER — ACETAMINOPHEN 325 MG PO TABS: 650.0000 mg | ORAL_TABLET | Freq: Four times a day (QID) | ORAL | Status: DC | PRN

## 2011-04-18 MED ORDER — ACETAMINOPHEN 650 MG RE SUPP
650.0000 mg | Freq: Four times a day (QID) | RECTAL | Status: DC | PRN
  Administered 2011-04-19: 650 mg via RECTAL
  Filled 2011-04-18: qty 1

## 2011-04-18 MED ORDER — ONDANSETRON HCL 4 MG/2ML IJ SOLN: 4.0000 mg | Freq: Once | INTRAMUSCULAR | Status: DC

## 2011-04-18 MED ORDER — METOPROLOL SUCCINATE ER 25 MG PO TB24
25.0000 mg | ORAL_TABLET | Freq: Every day | ORAL | Status: DC
  Administered 2011-04-18 – 2011-04-24 (×7): 25 mg via ORAL
  Filled 2011-04-18 (×8): qty 1

## 2011-04-18 MED ORDER — RIFAXIMIN 550 MG PO TABS
550.0000 mg | ORAL_TABLET | Freq: Two times a day (BID) | ORAL | Status: DC
  Administered 2011-04-18 – 2011-04-28 (×20): 550 mg via ORAL
  Filled 2011-04-18 (×30): qty 1

## 2011-04-18 MED ORDER — FUROSEMIDE 20 MG PO TABS
20.0000 mg | ORAL_TABLET | Freq: Every day | ORAL | Status: DC
  Administered 2011-04-18 – 2011-04-24 (×7): 20 mg via ORAL
  Filled 2011-04-18 (×7): qty 1

## 2011-04-18 MED ORDER — ONDANSETRON HCL 4 MG/2ML IJ SOLN
4.0000 mg | Freq: Once | INTRAMUSCULAR | Status: AC
  Administered 2011-04-18: 4 mg via INTRAVENOUS
  Filled 2011-04-18: qty 2

## 2011-04-18 MED ORDER — LACTULOSE 10 GM/15ML PO SOLN
10.0000 g | Freq: Three times a day (TID) | ORAL | Status: DC
  Administered 2011-04-18 – 2011-04-28 (×25): 10 g via ORAL
  Filled 2011-04-18 (×9): qty 30
  Filled 2011-04-18: qty 60
  Filled 2011-04-18 (×14): qty 30
  Filled 2011-04-18: qty 60
  Filled 2011-04-18: qty 30

## 2011-04-18 MED ORDER — SODIUM CHLORIDE 0.9 % IV SOLN: INTRAVENOUS | Status: AC

## 2011-04-18 MED ORDER — INSULIN ASPART 100 UNIT/ML ~~LOC~~ SOLN
3.0000 [IU] | Freq: Three times a day (TID) | SUBCUTANEOUS | Status: DC
  Administered 2011-04-18: 3 [IU] via SUBCUTANEOUS

## 2011-04-18 MED ORDER — FENTANYL CITRATE 0.05 MG/ML IJ SOLN: 50.0000 ug | Freq: Once | INTRAMUSCULAR | Status: DC

## 2011-04-18 MED ORDER — SODIUM CHLORIDE 0.9 % IJ SOLN
3.0000 mL | Freq: Two times a day (BID) | INTRAMUSCULAR | Status: DC
  Administered 2011-04-18 – 2011-04-26 (×13): 3 mL via INTRAVENOUS
  Filled 2011-04-18 (×14): qty 3

## 2011-04-18 NOTE — ED Notes (Signed)
[name]- [phone]                                           [phone] Home

## 2011-04-18 NOTE — Progress Notes (Signed)
Physical Therapy Evaluation Patient Details Name: [name] MRN: [id number] DOB: [birth year] Today's Date: 04/18/2011  Problem List:  Patient Active Problem List  Diagnoses  . HYPOTHYROIDISM  . DEMENTIA  . ANXIETY  . PERIPHERAL NEUROPATHY  . GERD  . GASTROPARESIS  . CIRRHOSIS  . INSOMNIA  . ANOREXIA  . NAUSEA  . Hip fracture, right    Past Medical History:  Past Medical History  Diagnosis Date  . Cirrhosis 1992    Cryptogenic  . Esophageal motility disorder 2006    Nonspecific  . GERD (gastroesophageal reflux disease)   . Peripheral neuropathy   . Insomnia   . Dementia   . Hypothyroid   . Anxiety disorder   . Arthritis   . Hypertension   . Depression    Past Surgical History:  Past Surgical History  Procedure Date  . Appendectomy   . Vesicovaginal fistula closure w/ tah   . Eye surgery   . Abdominal hysterectomy     PT Assessment/Plan/Recommendation    No treatment until patient is seen by an orthopedic MD.   PT Goals     PT Evaluation    Not completed  Precautions/Restrictions  Restrictions Weight Bearing Restrictions: Yes Prior Functioning   ambulatory   Cognition Cognition Orientation Level: Oriented to person;Oriented to place Sensation/Coordination      RUSSELL,CINDY 04/18/2011, 5:49 PM

## 2011-04-18 NOTE — ED Notes (Signed)
I.V. 20 gauge left a/c started by EMS patent with positive blood return--no redness or swelling along site---redressed with Tegraderm dressing.

## 2011-04-18 NOTE — ED Notes (Signed)
Foley Cath checked. No urine draining. Per family request RN will wait to D/C foley until pt is comfortable and relaxed.

## 2011-04-18 NOTE — ED Notes (Signed)
No change in condition. Pt stable at this time. Pt resting supine in stretcher. Will continue to monitor.

## 2011-04-18 NOTE — ED Notes (Addendum)
She and son are unsure when her last tetanus injection was and son states she takes Oxycodone 30 to 40 mgs q 4 hrs and last dose was 6 a.m.  Technically, she lives alone BUT, has a sitter during the day and her son stays with her at p.m.  Her son states she is never left alone.

## 2011-04-18 NOTE — ED Notes (Signed)
Attempted to give report to floor nurse. Per Research scientist (medical) unavailable.

## 2011-04-18 NOTE — Consult Note (Signed)
Reason for Consult:Fracture of the right hip Referring Physician: Hospitalist   [name] is an 77 y.o. female.  HPI: She fell at home.  She has dementia.  The family has round the clock sitters.  She does not usually get out of bed.  Her son had just left her room.  He heard a loud sound and found her on the floor.  She complained of right hip pain.  She was seen in the ER.  Because of multiple medical problems, she was admitted by the hospitalist.    I have seen her in the office recently this summer for right knee pain.  She was evaluated by her family doctor and cardiologist and found a candidate for right knee arthroscopy. She has had swelling of the right knee and giving way.  She had a positive MRI for tear of the meniscus.  Her son shows me a note saying she had been cleared for surgery for her knee in September. She has not been back to the office, however, to schedule the procedure.  It would have been a elective outpatient procedure.  Past Medical History  Diagnosis Date  . Cirrhosis 1992    Cryptogenic  . Esophageal motility disorder 2006    Nonspecific  . GERD (gastroesophageal reflux disease)   . Peripheral neuropathy   . Insomnia   . Dementia   . Hypothyroid   . Anxiety disorder   . Arthritis   . Hypertension   . Depression     Past Surgical History  Procedure Date  . Appendectomy   . Vesicovaginal fistula closure w/ tah   . Eye surgery   . Abdominal hysterectomy     History reviewed. No pertinent family history.  Social History:  reports that she has never smoked. She has never used smokeless tobacco. She reports that she does not drink alcohol or use illicit drugs.  Allergies:  Allergies  Allergen Reactions  . Aspirin     hives  . Codeine     REACTION: UNKNOWN REACTION  . Penicillins   . Sulfonamide Derivatives     REACTION: UNKNOWN REACTION  . Zolpidem Tartrate     REACTION: UNKNOWN REACTION    Medications: I have reviewed the patient's  current medications.  Results for orders placed during the hospital encounter of 04/18/11 (from the past 48 hour(s))  CBC     Status: Abnormal   Collection Time   04/18/11 10:44 AM      Component Value Range Comment   WBC 7.3  4.0 - 10.5 (K/uL)    RBC 4.52  3.87 - 5.11 (MIL/uL)    Hemoglobin 13.8  12.0 - 15.0 (g/dL)    HCT 16.1  09.6 - 04.5 (%)    MCV 94.7  78.0 - 100.0 (fL)    MCH 30.5  26.0 - 34.0 (pg)    MCHC 32.2  30.0 - 36.0 (g/dL)    RDW 40.9  81.1 - 91.4 (%)    Platelets 86 (*) 150 - 400 (K/uL)   DIFFERENTIAL     Status: Abnormal   Collection Time   04/18/11 10:44 AM      Component Value Range Comment   Neutrophils Relative 82 (*) 43 - 77 (%)    Neutro Abs 6.0  1.7 - 7.7 (K/uL)    Lymphocytes Relative 7 (*) 12 - 46 (%)    Lymphs Abs 0.5 (*) 0.7 - 4.0 (K/uL)    Monocytes Relative 9  3 - 12 (%)  Monocytes Absolute 0.6  0.1 - 1.0 (K/uL)    Eosinophils Relative 2  0 - 5 (%)    Eosinophils Absolute 0.2  0.0 - 0.7 (K/uL)    Basophils Relative 0  0 - 1 (%)    Basophils Absolute 0.0  0.0 - 0.1 (K/uL)   COMPREHENSIVE METABOLIC PANEL     Status: Abnormal   Collection Time   04/18/11 10:44 AM      Component Value Range Comment   Sodium 145  135 - 145 (mEq/L)    Potassium 4.4  3.5 - 5.1 (mEq/L)    Chloride 108  96 - 112 (mEq/L)    CO2 27  19 - 32 (mEq/L)    Glucose, Bld 185 (*) 70 - 99 (mg/dL)    BUN 29 (*) 6 - 23 (mg/dL)    Creatinine, Ser 4.69 (*) 0.50 - 1.10 (mg/dL)    Calcium 9.1  8.4 - 10.5 (mg/dL)    Total Protein 6.5  6.0 - 8.3 (g/dL)    Albumin 3.0 (*) 3.5 - 5.2 (g/dL)    AST 31  0 - 37 (U/L)    ALT 15  0 - 35 (U/L)    Alkaline Phosphatase 121 (*) 39 - 117 (U/L)    Total Bilirubin 0.7  0.3 - 1.2 (mg/dL)    GFR calc non Af Amer 27 (*) >90 (mL/min)    GFR calc Af Amer 32 (*) >90 (mL/min)   AMMONIA     Status: Abnormal   Collection Time   04/18/11 10:44 AM      Component Value Range Comment   Ammonia 65 (*) 11 - 60 (umol/L)   PROTIME-INR     Status: Normal    Collection Time   04/18/11 10:44 AM      Component Value Range Comment   Prothrombin Time 15.2  11.6 - 15.2 (seconds)    INR 1.18  0.00 - 1.49    APTT     Status: Normal   Collection Time   04/18/11 10:44 AM      Component Value Range Comment   aPTT 35  24 - 37 (seconds)   URINALYSIS, ROUTINE W REFLEX MICROSCOPIC     Status: Abnormal   Collection Time   04/18/11  2:47 PM      Component Value Range Comment   Color, Urine YELLOW  YELLOW     Appearance CLEAR  CLEAR     Specific Gravity, Urine 1.025  1.005 - 1.030     pH 5.5  5.0 - 8.0     Glucose, UA NEGATIVE  NEGATIVE (mg/dL)    Hgb urine dipstick SMALL (*) NEGATIVE     Bilirubin Urine NEGATIVE  NEGATIVE     Ketones, ur NEGATIVE  NEGATIVE (mg/dL)    Protein, ur NEGATIVE  NEGATIVE (mg/dL)    Urobilinogen, UA 1.0  0.0 - 1.0 (mg/dL)    Nitrite POSITIVE (*) NEGATIVE     Leukocytes, UA NEGATIVE  NEGATIVE    URINE MICROSCOPIC-ADD ON     Status: Abnormal   Collection Time   04/18/11  2:47 PM      Component Value Range Comment   Squamous Epithelial / LPF RARE  RARE     WBC, UA 0-2  <3 (WBC/hpf)    RBC / HPF 3-6  <3 (RBC/hpf)    Bacteria, UA RARE  RARE     Casts HYALINE CASTS (*) NEGATIVE    GLUCOSE, CAPILLARY     Status: Abnormal  Collection Time   04/18/11  4:57 PM      Component Value Range Comment   Glucose-Capillary 145 (*) 70 - 99 (mg/dL)     Dg Pelvis 1-2 Views  04/18/2011  *RADIOLOGY REPORT*  Clinical Data: Recent fall with right hip pain  PELVIS - 1-2 VIEW  Comparison: CT abdomen and pelvis of 08/15/2010  Findings: There is an acute angulated right intertrochanteric femoral fracture present.  No other acute abnormality is seen.  The pelvic rami are intact.  The SI joints appear normal.  IMPRESSION: Acute angulated right intertrochanteric femoral fracture.  Original Report Authenticated By: Juline Patch, M.D.   Dg Femur Right  04/18/2011  *RADIOLOGY REPORT*  Clinical Data: Recent fall with right hip pain  RIGHT FEMUR -  2 VIEW  Comparison: Right hip films of 04/12/2004  Findings: There is an acute right intertrochanteric femoral fracture with angulation.  No other acute abnormality is seen.  The remainder of the right femur is intact.  IMPRESSION: Acute angulated right intertrochanteric femoral fracture.  Original Report Authenticated By: Juline Patch, M.D.   Dg Chest Portable 1 View  04/18/2011  *RADIOLOGY REPORT*  Clinical Data: Preop, fractured hip  PORTABLE CHEST - 1 VIEW  Comparison: Chest x-ray of 05/15/2010  Findings: The lungs are clear and slightly hyperaerated. Mediastinal contours appear normal.  The heart is within upper limits of normal.  No bony abnormality is seen.  IMPRESSION: No active lung disease.  Slight hyperaeration.  Original Report Authenticated By: Juline Patch, M.D.    Review of Systems  Constitutional: Negative.   HENT: Negative.   Eyes: Negative.   Respiratory: Negative.   Cardiovascular: Negative.   Gastrointestinal: Positive for heartburn and nausea.  Genitourinary: Negative.   Musculoskeletal: Positive for joint pain (Right hip now, history of right knee pain for some time.) and falls (today.).  Skin: Negative.   Neurological: Positive for dizziness, sensory change and focal weakness (peripheral neuropathy).  Endo/Heme/Allergies: Negative.   Psychiatric/Behavioral: Positive for memory loss (History of dementia).   Blood pressure 148/72, pulse 76, temperature 99.4 F (37.4 C), temperature source Oral, resp. rate 17, height 5\' 4"  (1.626 m), weight 61.2 kg (134 lb 14.7 oz), SpO2 92.00%. Physical Exam  Constitutional: She appears well-developed and well-nourished.  HENT:  Head: Normocephalic and atraumatic.  Eyes: Conjunctivae and EOM are normal. Pupils are equal, round, and reactive to light.  Neck: Normal range of motion. Neck supple.  Cardiovascular: Normal rate and regular rhythm.   Respiratory: Effort normal and breath sounds normal.  GI: Soft. Bowel sounds are  normal.  Musculoskeletal: She exhibits tenderness (right hip).       Right hip: She exhibits decreased range of motion, tenderness, bony tenderness and swelling.       Legs: Skin: Skin is warm and dry.  She is disoriented to time,place and to me.  She knows her son and communicates with him.  Assessment/Plan:  Intertrochanteric fracture of the right hip, displaced. Dementia.  I have talked to the hospitalist on call tonight.  Work-up is still in progress.  She is at increased risk for any surgery secondary to her multiple medical problems.  I have talked to her son.  I will delay surgery until Monday, when I can have the anesthesiologist present for the procedure and the work-up should be completed by then.  I have gone over risks and imponderables including infection, blood clots leading to embolism and death, nerve injury, need for blood transfusion, need for  post operative skilled nursing care, possibility that with her dementia she may not do well with therapy or may not walk much or at all, and anesthesia risks among others.  He appears to understand.  I will have her covered with enoxaparin tomorrow and Sunday and hopefully be able to do surgery Monday depending on the work-up evaluation.  [name] 04/18/2011, 8:56 PM

## 2011-04-18 NOTE — ED Notes (Signed)
She fell this a.m. As she was getting out of bed--fell to floor--c/o right upper leg pain---external rotation and shortening noted of right lower extremity--also skin tear to right elbow.

## 2011-04-18 NOTE — ED Notes (Signed)
14 F foley cath inserted by Vicky NT. Pt linen changed.

## 2011-04-18 NOTE — ED Provider Notes (Addendum)
History   This chart was scribed for Dr. Effie Shy by Clarita Crane. The patient was seen in room [facility]14[facility] and the patient's care was started at 10:16AM.  CSN: [id number] Arrival date & time: 04/18/2011 10:09 AM  Chief Complaint  Patient presents with  . Genia Hotter at home this a.m.--was getting out of bed--fell to floor--c/o right hip pain and right elbow skin tear    (Consider location/radiation/quality/duration/timing/severity/associated sxs/prior treatment) The history is limited by the condition of the patient.   A Level 5 Caveat Applies Due to Dementia [name] is a 77 y.o. female who presents to the Emergency Department complaining of constant moderate right hip and right lower extremity pain onset this morning following a fall. Patient is unsure of mechanism of fall. Denies head injury, neck pain, back pain, HA at this time. Patient is unsure of Tetanus status. Patient with h/o Cirrhosis, GERD, Dementia, hypertension.   PCP- Sherwood Gambler  Past Medical History  Diagnosis Date  . Cirrhosis 1992    Cryptogenic  . Esophageal motility disorder 2006    Nonspecific  . GERD (gastroesophageal reflux disease)   . Peripheral neuropathy   . Insomnia   . Dementia   . Hypothyroid   . Anxiety disorder   . Arthritis   . Hypertension     Past Surgical History  Procedure Date  . Appendectomy   . Vesicovaginal fistula closure w/ tah   . Eye surgery   . Abdominal hysterectomy     No family history on file.  History  Substance Use Topics  . Smoking status: Not on file  . Smokeless tobacco: Not on file  . Alcohol Use: No    OB History    Grav Para Term Preterm Abortions TAB SAB Ect Mult Living                  Review of Systems  Unable to perform ROS: Dementia   Allergies  Aspirin; Codeine; Penicillins; Sulfonamide derivatives; and Zolpidem tartrate  Home Medications   Current Outpatient Rx  Name Route Sig Dispense Refill  . DONEPEZIL HCL 10 MG PO TABS Oral Take  10 mg by mouth at bedtime.      . DONEPEZIL HCL 5 MG PO TABS Oral Take 5 mg by mouth at bedtime as needed.     . FUROSEMIDE 20 MG PO TABS Oral Take 20 mg by mouth daily. Takes at lunchtime    . LACTULOSE 10 G PO PACK Oral Take 10 g by mouth 3 (three) times daily as needed. constipation    . LEVOTHYROXINE SODIUM 75 MCG PO TABS Oral Take 75 mcg by mouth daily.     Marland Kitchen METOCLOPRAMIDE HCL 5 MG PO TABS Oral Take 5 mg by mouth 2 (two) times daily.     Marland Kitchen METOPROLOL SUCCINATE 50 MG PO TB24 Oral Take 25 mg by mouth daily.      . OXYCODONE HCL 10 MG PO TB12 Oral Take 20 mg by mouth every 4 (four) hours as needed. Breakthrough pain     . POTASSIUM CHLORIDE 10 MEQ PO TBCR Oral Take 10 mEq by mouth 2 (two) times daily.     Marland Kitchen PREGABALIN 100 MG PO CAPS Oral Take 100 mg by mouth 3 (three) times daily.     Marland Kitchen XIFAXAN 550 MG PO TABS  TAKE 2 TABLETS BY MOUTH  ONCE DAILY. 62 tablet 5  . CALCIUM CARBONATE 600 MG PO TABS Oral Take 600 mg by mouth 2 (  two) times daily with a meal.      . ESOMEPRAZOLE MAGNESIUM 40 MG PO CPDR Oral Take 40 mg by mouth daily before breakfast. Med is expired but patient very seldom takes    . NEURPATH-B PO Oral Take by mouth.      . SOLIFENACIN SUCCINATE 5 MG PO TABS Oral Take 10 mg by mouth daily.      Marland Kitchen TRIAZOLAM 0.25 MG PO TABS Oral Take 0.25 mg by mouth at bedtime as needed. Confusion and sleep aid      BP 99/33  Pulse 69  Temp(Src) 98.2 F (36.8 C) (Oral)  Resp 16  Ht 5\' 4"  (1.626 m)  Wt 135 lb (61.236 kg)  BMI 23.17 kg/m2  SpO2 94%  Physical Exam  Nursing note and vitals reviewed. Constitutional: She is oriented to person, place, and time. She appears well-developed and well-nourished. No distress.  HENT:  Head: Normocephalic and atraumatic.  Eyes: EOM are normal. Pupils are equal, round, and reactive to light.  Neck: Neck supple. No tracheal deviation present.  Cardiovascular: Normal rate and regular rhythm.   Pulmonary/Chest: Effort normal. No respiratory distress.    Abdominal: She exhibits no distension.  Musculoskeletal: She exhibits tenderness. She exhibits no edema.       RLE externally rotated and shortened 1 inch compared to LLE. Tenderness to palpation at right hip. FROM of bilateral upper extremities.   Neurological: She is alert and oriented to person, place, and time. No sensory deficit.  Skin: Skin is warm and dry.  Psychiatric: She has a normal mood and affect. Her behavior is normal.    ED Course  Procedures (including critical care time)  Date: 04/18/2011  Rate: 63  Rhythm: normal sinus rhythm  QRS Axis: normal  Intervals: normal  ST/T Wave abnormalities: normal  Conduction Disutrbances:none  Narrative Interpretation: [facility] Old EKG Reviewed: changes noted  DIAGNOSTIC STUDIES: Oxygen Saturation is 97% on room air, normal by my interpretation.    COORDINATION OF CARE: 2:05PM- Consult complete with Orthopedics. Patient case explained and discussed. Patient care plan discussed.   Labs Reviewed  CBC - Abnormal; Notable for the following:    Platelets 86 (*)    All other components within normal limits  DIFFERENTIAL - Abnormal; Notable for the following:    Neutrophils Relative 82 (*)    Lymphocytes Relative 7 (*)    Lymphs Abs 0.5 (*)    All other components within normal limits  COMPREHENSIVE METABOLIC PANEL - Abnormal; Notable for the following:    Glucose, Bld 185 (*)    BUN 29 (*)    Creatinine, Ser 1.64 (*)    Albumin 3.0 (*)    Alkaline Phosphatase 121 (*)    GFR calc non Af Amer 27 (*)    GFR calc Af Amer 32 (*)    All other components within normal limits  AMMONIA - Abnormal; Notable for the following:    Ammonia 65 (*)    All other components within normal limits  PROTIME-INR  APTT   Dg Pelvis 1-2 Views  04/18/2011  *RADIOLOGY REPORT*  Clinical Data: Recent fall with right hip pain  PELVIS - 1-2 VIEW  Comparison: CT abdomen and pelvis of 08/15/2010  Findings: There is an acute angulated right intertrochanteric  femoral fracture present.  No other acute abnormality is seen.  The pelvic rami are intact.  The SI joints appear normal.  IMPRESSION: Acute angulated right intertrochanteric femoral fracture.  Original Report Authenticated By: Juline Patch,  M.D.   Dg Femur Right  04/18/2011  *RADIOLOGY REPORT*  Clinical Data: Recent fall with right hip pain  RIGHT FEMUR - 2 VIEW  Comparison: Right hip films of 04/12/2004  Findings: There is an acute right intertrochanteric femoral fracture with angulation.  No other acute abnormality is seen.  The remainder of the right femur is intact.  IMPRESSION: Acute angulated right intertrochanteric femoral fracture.  Original Report Authenticated By: Juline Patch, M.D.   Dg Chest Portable 1 View  04/18/2011  *RADIOLOGY REPORT*  Clinical Data: Preop, fractured hip  PORTABLE CHEST - 1 VIEW  Comparison: Chest x-ray of 05/15/2010  Findings: The lungs are clear and slightly hyperaerated. Mediastinal contours appear normal.  The heart is within upper limits of normal.  No bony abnormality is seen.  IMPRESSION: No active lung disease.  Slight hyperaeration.  Original Report Authenticated By: Juline Patch, M.D.     No diagnosis found.    MDM  Fall, likely related to chronic weakness with acute right hip fracture. Patient is frail with multiple medical problems. Screening evaluation in the emergency department for acute causes of fractures is essentially negative with the exception of the ammonia elevated at 65, likely urinary tract infection, and dehydration indicated by elevated BUN and creatinine. Urine culture is ordered in the emergency department.    I personally performed the services described in this documentation, which was scribed in my presence. The recorded information has been reviewed and considered.    Flint Melter, MD 04/18/11 1628  Flint Melter, MD 04/18/11 [id number]

## 2011-04-18 NOTE — H&P (Addendum)
PCP:   Cassell Smiles., MD   Chief Complaint:  Right hip pain  HPI: This is an 77 year old female with a past medical history significant for cryptogenic cirrhosis presenting to the ED via EMS for right hip pain. The patient was doing well until this morning when she tried to get out of bed and go to the bathroom and accidentally fell on the floor. Subsequently she called her son who was fixing coffee in the kitchen and when he came she found mother in the floor complaining of right leg pain. Subsequently EMS was contacted and patient brought to the ED. Evaluation reveals a right nondisplaced hip fracture. She has received 2 doses of fentanyl 50 mcg each with good relief of pain. At the time of my evaluation the patient was pain-free. Unfortunately she was not able to provide much of a history because of underlying dementia. She was able to answer my questions to yes and no. She wanted to go to the bathroom to urinate despite having a Foley catheter in with good drainage of urine  Allergies:   Allergies  Allergen Reactions  . Aspirin     hives  . Codeine     REACTION: UNKNOWN REACTION  . Penicillins   . Sulfonamide Derivatives     REACTION: UNKNOWN REACTION  . Zolpidem Tartrate     REACTION: UNKNOWN REACTION      Past Medical History  Diagnosis Date  . Cirrhosis 1992    Cryptogenic  . Esophageal motility disorder 2006    Nonspecific  . GERD (gastroesophageal reflux disease)   . Peripheral neuropathy   . Insomnia   . Dementia   . Hypothyroid   . Anxiety disorder   . Arthritis   . Hypertension   . Depression     Past Surgical History  Procedure Date  . Appendectomy   . Vesicovaginal fistula closure w/ tah   . Eye surgery   . Abdominal hysterectomy     Prior to Admission medications   Medication Sig Start Date End Date Taking? Authorizing Provider  donepezil (ARICEPT) 10 MG tablet Take 10 mg by mouth at bedtime.     Yes Historical Provider, MD  donepezil  (ARICEPT) 5 MG tablet Take 5 mg by mouth at bedtime as needed.    Yes Historical Provider, MD  furosemide (LASIX) 20 MG tablet Take 20 mg by mouth daily. Takes at lunchtime   Yes Historical Provider, MD  lactulose (CEPHULAC) 10 G packet Take 10 g by mouth 3 (three) times daily as needed. constipation   Yes Historical Provider, MD  levothyroxine (SYNTHROID, LEVOTHROID) 75 MCG tablet Take 75 mcg by mouth daily.    Yes Historical Provider, MD  metoprolol (TOPROL-XL) 50 MG 24 hr tablet Take 25 mg by mouth daily.     Yes Historical Provider, MD  potassium chloride (KLOR-CON) 10 MEQ CR tablet Take 10 mEq by mouth 2 (two) times daily.    Yes Historical Provider, MD  pregabalin (LYRICA) 100 MG capsule Take 100 mg by mouth 3 (three) times daily.    Yes Historical Provider, MD  XIFAXAN 550 MG TABS TAKE 2 TABLETS BY MOUTH  ONCE DAILY. 12/08/10  Yes Gerrit Halls, NP  calcium carbonate (OS-CAL) 600 MG TABS Take 600 mg by mouth 2 (two) times daily with a meal.      Historical Provider, MD  esomeprazole (NEXIUM) 40 MG capsule Take 40 mg by mouth daily before breakfast. Med is expired but patient very seldom takes  Historical Provider, MD  L-Methylfolate-B6-B12 (NEURPATH-B PO) Take by mouth.      Historical Provider, MD  solifenacin (VESICARE) 5 MG tablet Take 10 mg by mouth daily.      Historical Provider, MD  triazolam (HALCION) 0.25 MG tablet Take 0.25 mg by mouth at bedtime as needed. Confusion and sleep aid    Historical Provider, MD    Social History:  reports that she has never smoked. She has never used smokeless tobacco. She reports that she does not drink alcohol or use illicit drugs.  History reviewed. Unable to obtain family history from patient  Review of Systems:  Unable to obtain any significant review of systems due to patient's underlying mental status  Blood pressure 118/34, pulse 84, temperature 98.2 F (36.8 C), temperature source Oral, resp. rate 16, height 5\' 4"  (1.626 m), weight 61.236 kg  (135 lb), SpO2 96.00%.  General: Frail, Alert, awake, disoriented, in no acute distress. HEENT: No bruits, no goiter. Heart: Regular rate and rhythm, without murmurs, rubs, gallops. Lungs: Clear to auscultation bilaterally. Abdomen: Soft, nontender, nondistended, positive bowel sounds. Extremities:right leg 1 inch shorter than left leg, slight rotated to the right Neuro: Grossly intact, nonfocal.     Labs on Admission:  Results for orders placed during the hospital encounter of 04/18/11 (from the past 48 hour(s))  CBC     Status: Abnormal   Collection Time   04/18/11 10:44 AM      Component Value Range Comment   WBC 7.3  4.0 - 10.5 (K/uL)    RBC 4.52  3.87 - 5.11 (MIL/uL)    Hemoglobin 13.8  12.0 - 15.0 (g/dL)    HCT 16.1  09.6 - 04.5 (%)    MCV 94.7  78.0 - 100.0 (fL)    MCH 30.5  26.0 - 34.0 (pg)    MCHC 32.2  30.0 - 36.0 (g/dL)    RDW 40.9  81.1 - 91.4 (%)    Platelets 86 (*) 150 - 400 (K/uL)   DIFFERENTIAL     Status: Abnormal   Collection Time   04/18/11 10:44 AM      Component Value Range Comment   Neutrophils Relative 82 (*) 43 - 77 (%)    Neutro Abs 6.0  1.7 - 7.7 (K/uL)    Lymphocytes Relative 7 (*) 12 - 46 (%)    Lymphs Abs 0.5 (*) 0.7 - 4.0 (K/uL)    Monocytes Relative 9  3 - 12 (%)    Monocytes Absolute 0.6  0.1 - 1.0 (K/uL)    Eosinophils Relative 2  0 - 5 (%)    Eosinophils Absolute 0.2  0.0 - 0.7 (K/uL)    Basophils Relative 0  0 - 1 (%)    Basophils Absolute 0.0  0.0 - 0.1 (K/uL)   COMPREHENSIVE METABOLIC PANEL     Status: Abnormal   Collection Time   04/18/11 10:44 AM      Component Value Range Comment   Sodium 145  135 - 145 (mEq/L)    Potassium 4.4  3.5 - 5.1 (mEq/L)    Chloride 108  96 - 112 (mEq/L)    CO2 27  19 - 32 (mEq/L)    Glucose, Bld 185 (*) 70 - 99 (mg/dL)    BUN 29 (*) 6 - 23 (mg/dL)    Creatinine, Ser 7.82 (*) 0.50 - 1.10 (mg/dL)    Calcium 9.1  8.4 - 10.5 (mg/dL)    Total Protein 6.5  6.0 - 8.3 (g/dL)  Albumin 3.0 (*) 3.5 - 5.2  (g/dL)    AST 31  0 - 37 (U/L)    ALT 15  0 - 35 (U/L)    Alkaline Phosphatase 121 (*) 39 - 117 (U/L)    Total Bilirubin 0.7  0.3 - 1.2 (mg/dL)    GFR calc non Af Amer 27 (*) >90 (mL/min)    GFR calc Af Amer 32 (*) >90 (mL/min)   AMMONIA     Status: Abnormal   Collection Time   04/18/11 10:44 AM      Component Value Range Comment   Ammonia 65 (*) 11 - 60 (umol/L)   PROTIME-INR     Status: Normal   Collection Time   04/18/11 10:44 AM      Component Value Range Comment   Prothrombin Time 15.2  11.6 - 15.2 (seconds)    INR 1.18  0.00 - 1.49    APTT     Status: Normal   Collection Time   04/18/11 10:44 AM      Component Value Range Comment   aPTT 35  24 - 37 (seconds)   URINALYSIS, ROUTINE W REFLEX MICROSCOPIC     Status: Abnormal   Collection Time   04/18/11  2:47 PM      Component Value Range Comment   Color, Urine YELLOW  YELLOW     Appearance CLEAR  CLEAR     Specific Gravity, Urine 1.025  1.005 - 1.030     pH 5.5  5.0 - 8.0     Glucose, UA NEGATIVE  NEGATIVE (mg/dL)    Hgb urine dipstick SMALL (*) NEGATIVE     Bilirubin Urine NEGATIVE  NEGATIVE     Ketones, ur NEGATIVE  NEGATIVE (mg/dL)    Protein, ur NEGATIVE  NEGATIVE (mg/dL)    Urobilinogen, UA 1.0  0.0 - 1.0 (mg/dL)    Nitrite POSITIVE (*) NEGATIVE     Leukocytes, UA NEGATIVE  NEGATIVE    URINE MICROSCOPIC-ADD ON     Status: Abnormal   Collection Time   04/18/11  2:47 PM      Component Value Range Comment   Squamous Epithelial / LPF RARE  RARE     WBC, UA 0-2  <3 (WBC/hpf)    RBC / HPF 3-6  <3 (RBC/hpf)    Bacteria, UA RARE  RARE     Casts HYALINE CASTS (*) NEGATIVE      Radiological Exams on Admission: RIGHT FEMUR - 2 VIEW  Comparison: Right hip films of 04/12/2004  Findings: There is an acute right intertrochanteric femoral  fracture with angulation. No other acute abnormality is seen. The  remainder of the right femur is intact.  IMPRESSION:  Acute angulated right intertrochanteric femoral fracture.    Original Report Authenticated By: Juline Patch, M.D.   Assessment/Plan Principal Problem:  *Hip fracture, right: obtain orthopedic consultation Secondary Problems: Cirrhosis stable Thrombocytopenia, secondary to cirrhosis, no bleeding Renal insufficiency, monitor electrolytes and Cr Cl. Dementia Hypothyroidism, obtain TSH Anxiety with insomnia, continue home meds. D/C Halcion Hyperglycemia, will place on ISS UA with positive nitrates. Obtain Urine Cx Code status: will discuss with her son.   Time Spent on Admission:30 min  Jonny Ruiz 04/18/2011, 4:04 PM

## 2011-04-19 DIAGNOSIS — R4182 Altered mental status, unspecified: Secondary | ICD-10-CM

## 2011-04-19 LAB — GLUCOSE, CAPILLARY
Glucose-Capillary: 145 mg/dL — ABNORMAL HIGH (ref 70–99)
Glucose-Capillary: 133 mg/dL — ABNORMAL HIGH (ref 70–99)

## 2011-04-19 LAB — BASIC METABOLIC PANEL
BUN: 27 mg/dL — ABNORMAL HIGH (ref 6–23)
CO2: 26 mEq/L (ref 19–32)
Calcium: 8.8 mg/dL (ref 8.4–10.5)
Creatinine, Ser: 1.4 mg/dL — ABNORMAL HIGH (ref 0.50–1.10)

## 2011-04-19 LAB — CBC
HCT: 39.8 % (ref 36.0–46.0)
MCHC: 32.7 g/dL (ref 30.0–36.0)
MCV: 94.3 fL (ref 78.0–100.0)
Platelets: 88 10*3/uL — ABNORMAL LOW (ref 150–400)
RDW: 13.9 % (ref 11.5–15.5)
WBC: 10.5 10*3/uL (ref 4.0–10.5)

## 2011-04-19 LAB — TSH: TSH: 2.244 u[IU]/mL (ref 0.350–4.500)

## 2011-04-19 MED ORDER — MORPHINE SULFATE CR 15 MG PO TB12
15.0000 mg | ORAL_TABLET | Freq: Two times a day (BID) | ORAL | Status: DC
  Administered 2011-04-19 (×2): 15 mg via ORAL
  Filled 2011-04-19 (×2): qty 1

## 2011-04-19 MED ORDER — POLYETHYLENE GLYCOL 3350 17 G PO PACK
17.0000 g | PACK | Freq: Every day | ORAL | Status: DC
  Administered 2011-04-19 – 2011-04-28 (×7): 17 g via ORAL
  Filled 2011-04-19 (×7): qty 1

## 2011-04-19 NOTE — Progress Notes (Signed)
Afebrile.  Vital signs normal.  Labs OK.  Plan OTIR of the right hip Monday.  Neurovascular intact.  I will order type and cross tomorrow am to have blood available for surgery Monday.  Her pain is controlled.  She had a good night.  I have talked to her son and granddaughter today.  They have asked appropriated questions which I answered.

## 2011-04-19 NOTE — Progress Notes (Signed)
Subjective: Complaining of pain in the right leg this morning. Pain is moderate to severe. She had mild pain last night. She has been getting when necessary oxycodone and Tylenol. No allergic reactions. Denies chest pain, shortness of breath, or palpitations. She had a bowel movement yesterday she says. She looks calm. In no distress. Patient remains disoriented  Objective: Vital signs in last 24 hours: Temp:  [97.8 F (36.6 C)-101.1 F (38.4 C)] 97.8 F (36.6 C) (10/13 0600) Pulse Rate:  [63-85] 83  (10/13 0600) Resp:  [16-24] 18  (10/13 0600) BP: (99-148)/(25-72) 112/50 mmHg (10/13 0600) SpO2:  [92 %-97 %] 92 % (10/13 0600) Weight:  [61.2 kg (134 lb 14.7 oz)-61.236 kg (135 lb)] 134 lb 14.7 oz (61.2 kg) (10/12 2027) Weight change:  Last BM Date: 04/17/11  Intake/Output from previous day: 10/12 0701 - 10/13 0700 In: -  Out: 525 [Urine:525] Total I/O In: 240 [P.O.:240] Out: -    Physical Exam: General: Alert, awake, in no distress HEENT: No JVD Heart: Regular rate and rhythm, without murmurs, rubs, gallops. Lungs: Clear to auscultation bilaterally. Abdomen: Soft, nontender, nondistended, positive bowel sounds. Extremities: Right leg is on Buck's traction  Neuro: Grossly intact, nonfocal.    Lab Results: Basic Metabolic Panel:  Basename 04/19/11 0523 04/18/11 1044  NA 145 145  K 4.9 4.4  CL 110 108  CO2 26 27  GLUCOSE 133* 185*  BUN 27* 29*  CREATININE 1.40* 1.64*  CALCIUM 8.8 9.1  MG -- --  PHOS -- --   Liver Function Tests:  Basename 04/18/11 1044  AST 31  ALT 15  ALKPHOS 121*  BILITOT 0.7  PROT 6.5  ALBUMIN 3.0*   No results found for this basename: LIPASE:2,AMYLASE:2 in the last 72 hours  Basename 04/18/11 1044  AMMONIA 65*   CBC:  Basename 04/19/11 0523 04/18/11 1044  WBC 10.5 7.3  NEUTROABS -- 6.0  HGB 13.0 13.8  HCT 39.8 42.8  MCV 94.3 94.7  PLT 88* 86*   Cardiac Enzymes: No results found for this basename:  CKTOTAL:3,CKMB:3,CKMBINDEX:3,TROPONINI:3 in the last 72 hours BNP: No results found for this basename: POCBNP:3 in the last 72 hours D-Dimer: No results found for this basename: DDIMER:2 in the last 72 hours CBG:  Basename 04/19/11 0738 04/18/11 2052 04/18/11 1657  GLUCAP 118* 174* 145*   Hemoglobin A1C: No results found for this basename: HGBA1C in the last 72 hours Fasting Lipid Panel: No results found for this basename: CHOL,HDL,LDLCALC,TRIG,CHOLHDL,LDLDIRECT in the last 72 hours Thyroid Function Tests:  Basename 04/18/11 1600  TSH 2.244  T4TOTAL --  FREET4 --  T3FREE --  THYROIDAB --   Anemia Panel: No results found for this basename: VITAMINB12,FOLATE,FERRITIN,TIBC,IRON,RETICCTPCT in the last 72 hours Urine Drug Screen:  Alcohol Level: No results found for this basename: ETH:2 in the last 72 hours Urinalysis:  Misc. Labs:  No results found for this or any previous visit (from the past 240 hour(s)).  Studies/Results: Dg Pelvis 1-2 Views  04/18/2011  *RADIOLOGY REPORT*  Clinical Data: Recent fall with right hip pain  PELVIS - 1-2 VIEW  Comparison: CT abdomen and pelvis of 08/15/2010  Findings: There is an acute angulated right intertrochanteric femoral fracture present.  No other acute abnormality is seen.  The pelvic rami are intact.  The SI joints appear normal.  IMPRESSION: Acute angulated right intertrochanteric femoral fracture.  Original Report Authenticated By: Juline Patch, M.D.   Dg Femur Right  04/18/2011  *RADIOLOGY REPORT*  Clinical Data: Recent  fall with right hip pain  RIGHT FEMUR - 2 VIEW  Comparison: Right hip films of 04/12/2004  Findings: There is an acute right intertrochanteric femoral fracture with angulation.  No other acute abnormality is seen.  The remainder of the right femur is intact.  IMPRESSION: Acute angulated right intertrochanteric femoral fracture.  Original Report Authenticated By: Juline Patch, M.D.   Dg Chest Portable 1  View  04/18/2011  *RADIOLOGY REPORT*  Clinical Data: Preop, fractured hip  PORTABLE CHEST - 1 VIEW  Comparison: Chest x-ray of 05/15/2010  Findings: The lungs are clear and slightly hyperaerated. Mediastinal contours appear normal.  The heart is within upper limits of normal.  No bony abnormality is seen.  IMPRESSION: No active lung disease.  Slight hyperaeration.  Original Report Authenticated By: Juline Patch, M.D.    Medications: Scheduled Meds:   . sodium chloride   Intravenous STAT  . enoxaparin  30 mg Subcutaneous Q24H  . fentaNYL  50 mcg Intravenous Once  . fentaNYL  50 mcg Intravenous Once  . fentaNYL  50 mcg Intravenous Once  . furosemide  20 mg Oral Daily  . insulin aspart  0-5 Units Subcutaneous QHS  . insulin aspart  0-9 Units Subcutaneous TID WC  . insulin aspart  3 Units Subcutaneous TID WC  . lactulose  10 g Oral TID  . levothyroxine  75 mcg Oral Daily  . metoprolol  25 mg Oral Daily  . ondansetron  4 mg Intravenous Once  . ondansetron  4 mg Intravenous Once  . pantoprazole  40 mg Oral Q1200  . potassium chloride  10 mEq Oral BID  . pregabalin  100 mg Oral TID  . rifaximin  550 mg Oral BID  . sodium chloride  3 mL Intravenous Q12H   Continuous Infusions:  PRN Meds:.acetaminophen, acetaminophen, HYDROmorphone, oxyCODONE, sodium chloride  Assessment/Plan:  Principal Problem:  *Hip fracture, right. Orthopedic consult appreciated. Patient didn't schedule for OR early next week perhaps Monday. We will control pain for the time being and continue DVT prophylaxis and general medical management. Secondary problems: Cirrhosis, stable Thrombocytopenia, stable. Chronic renal insufficiency, stable Hypothyroidism, stable. TSH normal GERD Hypothyroidism Urinalysis with positive nitrates. pending urine culture results. CODE STATUS: I discussed with Mellody Dance the patient's son CODE STATUS this morning. The patient does not want to be kept alive by artificial means i.e.  mechanical ventilation and therefore does not want to be intubated. However the patient wants CPR and defibrillation if needed.    LOS: 1 day   Jonny Ruiz 04/19/2011, 9:18 AM

## 2011-04-20 ENCOUNTER — Inpatient Hospital Stay (HOSPITAL_COMMUNITY): Payer: PRIVATE HEALTH INSURANCE

## 2011-04-20 DIAGNOSIS — R509 Fever, unspecified: Secondary | ICD-10-CM | POA: Diagnosis not present

## 2011-04-20 LAB — CBC
HCT: 40.1 % (ref 36.0–46.0)
MCHC: 32.7 g/dL (ref 30.0–36.0)
Platelets: 77 10*3/uL — ABNORMAL LOW (ref 150–400)
RDW: 13.9 % (ref 11.5–15.5)
WBC: 13.8 10*3/uL — ABNORMAL HIGH (ref 4.0–10.5)

## 2011-04-20 LAB — PREPARE RBC (CROSSMATCH)

## 2011-04-20 LAB — DIFFERENTIAL
Basophils Absolute: 0 10*3/uL (ref 0.0–0.1)
Eosinophils Absolute: 0 10*3/uL (ref 0.0–0.7)
Lymphocytes Relative: 4 % — ABNORMAL LOW (ref 12–46)
Monocytes Relative: 9 % (ref 3–12)
Neutro Abs: 12 10*3/uL — ABNORMAL HIGH (ref 1.7–7.7)
Neutrophils Relative %: 87 % — ABNORMAL HIGH (ref 43–77)

## 2011-04-20 LAB — GLUCOSE, CAPILLARY
Glucose-Capillary: 154 mg/dL — ABNORMAL HIGH (ref 70–99)
Glucose-Capillary: 155 mg/dL — ABNORMAL HIGH (ref 70–99)

## 2011-04-20 LAB — ABO/RH: ABO/RH(D): O POS

## 2011-04-20 LAB — URINE CULTURE
Colony Count: NO GROWTH
Culture  Setup Time: 201210130252

## 2011-04-20 MED ORDER — LEVOFLOXACIN IN D5W 500 MG/100ML IV SOLN
500.0000 mg | INTRAVENOUS | Status: DC
  Administered 2011-04-20 – 2011-04-23 (×4): 500 mg via INTRAVENOUS
  Filled 2011-04-20 (×5): qty 100

## 2011-04-20 MED ORDER — MORPHINE SULFATE CR 15 MG PO TB12: 15.0000 mg | ORAL_TABLET | Freq: Two times a day (BID) | ORAL | Status: DC | PRN

## 2011-04-20 MED ORDER — CEFAZOLIN SODIUM 1-5 GM-% IV SOLN: 1.0000 g | Freq: Once | INTRAVENOUS | Status: DC

## 2011-04-20 MED ORDER — ACETAMINOPHEN 650 MG RE SUPP: 650.0000 mg | Freq: Once | RECTAL | Status: DC

## 2011-04-20 MED ORDER — VANCOMYCIN HCL IN DEXTROSE 1-5 GM/200ML-% IV SOLN
INTRAVENOUS | Status: AC
  Filled 2011-04-20: qty 200

## 2011-04-20 MED ORDER — LEVOFLOXACIN IN D5W 500 MG/100ML IV SOLN
INTRAVENOUS | Status: AC
  Filled 2011-04-20: qty 100

## 2011-04-20 MED ORDER — VANCOMYCIN HCL IN DEXTROSE 1-5 GM/200ML-% IV SOLN
1000.0000 mg | Freq: Two times a day (BID) | INTRAVENOUS | Status: DC
  Administered 2011-04-20: 1000 mg via INTRAVENOUS
  Filled 2011-04-20 (×2): qty 200

## 2011-04-20 MED ORDER — CEFAZOLIN SODIUM 1-5 GM-% IV SOLN
1.0000 g | Freq: Once | INTRAVENOUS | Status: AC
  Administered 2011-04-21: 1 g via INTRAVENOUS
  Filled 2011-04-20: qty 50

## 2011-04-20 MED ORDER — POVIDONE-IODINE 10 % EX SOLN
Freq: Once | CUTANEOUS | Status: AC
  Administered 2011-04-20: 22:00:00 via TOPICAL

## 2011-04-20 NOTE — Progress Notes (Signed)
Subjective: Patient seen and examined, developed altered mental status last night. Patient was started on MS contin which has now been discontinued. Patient also developed fever 102.8, and has elevated WBC of 13.8. No complaints except for pain.  Objective: Vital signs in last 24 hours: Temp:  [98 F (36.7 C)-102.8 F (39.3 C)] 98 F (36.7 C) (10/14 1414) Pulse Rate:  [60-98] 83  (10/14 1414) Resp:  [18-24] 18  (10/14 1414) BP: (98-158)/(40-71) 102/61 mmHg (10/14 1414) SpO2:  [90 %-93 %] 93 % (10/14 1414) Weight change:  Last BM Date: 04/17/11  Intake/Output from previous day: 10/13 0701 - 10/14 0700 In: 603 [P.O.:600; I.V.:3] Out: 750 [Urine:750] Total I/O In: 483 [P.O.:480; I.V.:3] Out: -    Physical Exam: General: Alert, awake, oriented x3, in no acute distress. HEENT: No bruits, no goiter. Heart: Regular rate and rhythm, without murmurs, rubs, gallops. Lungs: Clear to auscultation bilaterally. Abdomen: Soft, nontender, nondistended, positive bowel sounds. Extremities: No clubbing cyanosis or edema with positive pedal pulses. Neuro: Grossly intact, nonfocal.    Lab Results: Basic Metabolic Panel:  Basename 04/19/11 0523 04/18/11 1044  NA 145 145  K 4.9 4.4  CL 110 108  CO2 26 27  GLUCOSE 133* 185*  BUN 27* 29*  CREATININE 1.40* 1.64*  CALCIUM 8.8 9.1  MG -- --  PHOS -- --   Liver Function Tests:  Basename 04/18/11 1044  AST 31  ALT 15  ALKPHOS 121*  BILITOT 0.7  PROT 6.5  ALBUMIN 3.0*   No results found for this basename: LIPASE:2,AMYLASE:2 in the last 72 hours  Basename 04/18/11 1044  AMMONIA 65*   CBC:  Basename 04/20/11 0053 04/19/11 0523 04/18/11 1044  WBC 13.8* 10.5 --  NEUTROABS 12.0* -- 6.0  HGB 13.1 13.0 --  HCT 40.1 39.8 --  MCV 94.6 94.3 --  PLT 77* 88* --   Cardiac Enzymes: No results found for this basename: CKTOTAL:3,CKMB:3,CKMBINDEX:3,TROPONINI:3 in the last 72 hours BNP: No results found for this basename: POCBNP:3 in  the last 72 hours D-Dimer: No results found for this basename: DDIMER:2 in the last 72 hours CBG:  Basename 04/20/11 1121 04/19/11 2134 04/19/11 1617 04/19/11 1200 04/19/11 0738 04/18/11 2052  GLUCAP 183* 133* 145* 125* 118* 174*   Hemoglobin A1C: No results found for this basename: HGBA1C in the last 72 hours Fasting Lipid Panel: No results found for this basename: CHOL,HDL,LDLCALC,TRIG,CHOLHDL,LDLDIRECT in the last 72 hours Thyroid Function Tests:  Basename 04/18/11 1600  TSH 2.244  T4TOTAL --  FREET4 --  T3FREE --  THYROIDAB --   Anemia Panel: No results found for this basename: VITAMINB12,FOLATE,FERRITIN,TIBC,IRON,RETICCTPCT in the last 72 hours  Alcohol Level: No results found for this basename: ETH:2 in the last 72 hours   Recent Results (from the past 240 hour([name]))  URINE CULTURE     Status: Normal   Collection Time   04/18/11  2:47 PM      Component Value Range Status Comment   Specimen Description URINE, CATHETERIZED   Final    Special Requests NONE   Final    Setup Time [id number]   Final    Colony Count NO GROWTH   Final    Culture NO GROWTH   Final    Report Status 04/20/2011 FINAL   Final   CULTURE, BLOOD (ROUTINE X 2)     Status: Normal (Preliminary result)   Collection Time   04/20/11 12:36 AM      Component Value Range Status Comment  Specimen Description BLOOD RIGHT ANTECUBITAL   Final    Special Requests BOTTLES DRAWN AEROBIC AND ANAEROBIC 10CC   Final    Culture NO GROWTH <24 HRS   Final    Report Status PENDING   Incomplete   CULTURE, BLOOD (ROUTINE X 2)     Status: Normal (Preliminary result)   Collection Time   04/20/11 12:53 AM      Component Value Range Status Comment   Specimen Description BLOOD BLOOD RIGHT HAND   Final    Special Requests BOTTLES DRAWN AEROBIC AND ANAEROBIC 7CC   Final    Culture NO GROWTH <24 HRS   Final    Report Status PENDING   Incomplete     Studies/Results: No results found.  Medications: Scheduled Meds:     . acetaminophen  650 mg Rectal Once  . ceFAZolin (ANCEF) IV  1 g Intravenous Once  . enoxaparin  30 mg Subcutaneous Q24H  . fentaNYL  50 mcg Intravenous Once  . furosemide  20 mg Oral Daily  . insulin aspart  0-5 Units Subcutaneous QHS  . insulin aspart  0-9 Units Subcutaneous TID WC  . insulin aspart  3 Units Subcutaneous TID WC  . lactulose  10 g Oral TID  . levothyroxine  75 mcg Oral Daily  . metoprolol  25 mg Oral Daily  . ondansetron  4 mg Intravenous Once  . pantoprazole  40 mg Oral Q1200  . polyethylene glycol  17 g Oral Daily  . potassium chloride  10 mEq Oral BID  . povidone-iodine   Topical Once  . pregabalin  100 mg Oral TID  . rifaximin  550 mg Oral BID  . sodium chloride  3 mL Intravenous Q12H  . DISCONTD: ceFAZolin (ANCEF) IV  1 g Intravenous Once  . DISCONTD: morphine  15 mg Oral Q12H   Continuous Infusions:  PRN Meds:.acetaminophen, acetaminophen, HYDROmorphone, morphine, oxyCODONE, sodium chloride  Assessment/Plan:  Principal Problem:  *Hip fracture, right: patient to go for surgery in am. Active Problems:  Fever of undetermined origin: Check CXR, today to rule out pneumonia. Blood cultures are pending. Altered mental status: Now resolved, required bipap last night. Will check ammonia level. Patient has h/o liver cirrhosis and is on lactulose, rifaximin. Chronic pain: Patient takes Oxycodone at home, will start Oxy IR 5 mg 1-2 tabs po q 4 hr prn. Hypothyroidism: continue synthroid. Periphral neuropathy: Continue pregabalin. Diabetes mellitus: Will d/c meal coverage as patient not eating much. Discussed code status with patient'[name] son, wants cpr, defibrillation, mechanical ventilation. Does not want to be on prolonged life support.     LOS: 2 days   [name] 04/20/2011, 2:45 PM

## 2011-04-20 NOTE — Progress Notes (Signed)
Writer called to clarify ancef and npo orders that were put in by Dr. Hilda Lias.  Received clarification and new orders.  Also made aware that pt is allergic to pcn and has ancef ordered, MD stated to override that he is aware.

## 2011-04-20 NOTE — Progress Notes (Signed)
She had temperature spike to 102 last night.  She is afebrile now.    Cultures of urine are pending.  Her HGB is stable.  Vital signs stable.  Plan surgery tomorrow mid day.  Pain controlled.

## 2011-04-20 NOTE — Progress Notes (Signed)
Writer made MD aware of CXR results, received new orders and followed.  Also questioned whether pt is clear for surgery scheduled tomorrow.  MD stated that it is up to the MD's in the morning to make that decision.  Writer made night nurse aware of orders.  Nurse to make son aware if new orders.

## 2011-04-21 DIAGNOSIS — J189 Pneumonia, unspecified organism: Secondary | ICD-10-CM | POA: Diagnosis not present

## 2011-04-21 LAB — CBC
Hemoglobin: 12.2 g/dL (ref 12.0–15.0)
MCH: 31.3 pg (ref 26.0–34.0)
MCHC: 32.7 g/dL (ref 30.0–36.0)
Platelets: 69 10*3/uL — ABNORMAL LOW (ref 150–400)
RBC: 3.9 MIL/uL (ref 3.87–5.11)

## 2011-04-21 LAB — GLUCOSE, CAPILLARY
Glucose-Capillary: 135 mg/dL — ABNORMAL HIGH (ref 70–99)
Glucose-Capillary: 118 mg/dL — ABNORMAL HIGH (ref 70–99)

## 2011-04-21 LAB — BASIC METABOLIC PANEL
BUN: 35 mg/dL — ABNORMAL HIGH (ref 6–23)
Chloride: 109 mEq/L (ref 96–112)
GFR calc Af Amer: 46 mL/min — ABNORMAL LOW (ref >90)
GFR calc non Af Amer: 40 mL/min — ABNORMAL LOW (ref >90)
Potassium: 4.8 mEq/L (ref 3.5–5.1)
Sodium: 143 mEq/L (ref 135–145)

## 2011-04-21 LAB — DIFFERENTIAL
Basophils Relative: 0 % (ref 0–1)
Eosinophils Absolute: 0.2 10*3/uL (ref 0.0–0.7)
Monocytes Relative: 9 % (ref 3–12)
Neutro Abs: 7.7 10*3/uL (ref 1.7–7.7)
Neutrophils Relative %: 81 % — ABNORMAL HIGH (ref 43–77)

## 2011-04-21 MED ORDER — VANCOMYCIN HCL 1000 MG IV SOLR
750.0000 mg | INTRAVENOUS | Status: DC
  Administered 2011-04-22 – 2011-04-23 (×3): 750 mg via INTRAVENOUS
  Filled 2011-04-21 (×4): qty 750

## 2011-04-21 MED ORDER — ENOXAPARIN SODIUM 40 MG/0.4ML ~~LOC~~ SOLN
40.0000 mg | SUBCUTANEOUS | Status: AC
  Administered 2011-04-21 – 2011-04-22 (×2): 40 mg via SUBCUTANEOUS
  Filled 2011-04-21 (×2): qty 0.4

## 2011-04-21 NOTE — Progress Notes (Signed)
She is afebrile now.  She was scheduled for surgery today.  But she has an abnormal x-ray showing either lower left lung infiltrate or early atelectasis.  I have postponed surgery today after talking to her son and the hospitalist.  Plans are to treat the lung findings today and tomorrow.  Then reassess.  Possibility to do surgery on Wednesday.  I feel this is prudent and reasonable to do.  I have talked to her son and he agrees.  I will schedule for surgery of the right hip for Wednesday around noon time.  Her white count has returned to normal.  She remains intermittently confused.  Her pain is controlled.  The hospitalist will resume the enoxaparin.

## 2011-04-21 NOTE — Progress Notes (Addendum)
ANTIBIOTIC CONSULT NOTE - INITIAL  Pharmacy Consult for Vancomycin Indication: rule out pneumonia  Allergies  Allergen Reactions  . Aspirin     hives  . Codeine     REACTION: UNKNOWN REACTION  . Penicillins   . Sulfonamide Derivatives     REACTION: UNKNOWN REACTION  . Zolpidem Tartrate     REACTION: UNKNOWN REACTION    Patient Measurements: Height: 5\' 4"  (162.6 cm) Weight: 134 lb 14.7 oz (61.2 kg) IBW/kg (Calculated) : 54.7  Adjusted Body Weight: N/A  Vital Signs: Temp: 98.1 F (36.7 C) (10/15 0500) Temp src: Oral (10/15 0500) BP: 127/68 mmHg (10/15 0500) Pulse Rate: 81  (10/15 0500) Intake/Output from previous day: 10/14 0701 - 10/15 0700 In: 603 [P.O.:600; I.V.:3] Out: 375 [Urine:375] Intake/Output from this shift:    Labs:  Basename 04/21/11 0643 04/20/11 0053 04/19/11 0523 04/18/11 1044  WBC 9.4 13.8* 10.5 --  HGB 12.2 13.1 13.0 --  PLT 69* 77* 88* --  LABCREA -- -- -- --  CREATININE 1.20* -- 1.40* 1.64*   Estimated Creatinine Clearance: 29.6 ml/min (by C-G formula based on Cr of 1.2).    Microbiology: Recent Results (from the past 720 hour(s))  URINE CULTURE     Status: Normal   Collection Time   04/18/11  2:47 PM      Component Value Range Status Comment   Specimen Description URINE, CATHETERIZED   Final    Special Requests NONE   Final    Setup Time [id number]   Final    Colony Count NO GROWTH   Final    Culture NO GROWTH   Final    Report Status 04/20/2011 FINAL   Final   CULTURE, BLOOD (ROUTINE X 2)     Status: Normal (Preliminary result)   Collection Time   04/20/11 12:36 AM      Component Value Range Status Comment   Specimen Description BLOOD RIGHT ANTECUBITAL   Final    Special Requests BOTTLES DRAWN AEROBIC AND ANAEROBIC 10CC   Final    Culture NO GROWTH <24 HRS   Final    Report Status PENDING   Incomplete   CULTURE, BLOOD (ROUTINE X 2)     Status: Normal (Preliminary result)   Collection Time   04/20/11 12:53 AM      Component  Value Range Status Comment   Specimen Description BLOOD BLOOD RIGHT HAND   Final    Special Requests BOTTLES DRAWN AEROBIC AND ANAEROBIC 7CC   Final    Culture NO GROWTH <24 HRS   Final    Report Status PENDING   Incomplete     Medical History: Past Medical History  Diagnosis Date  . Cirrhosis 1992    Cryptogenic  . Esophageal motility disorder 2006    Nonspecific  . GERD (gastroesophageal reflux disease)   . Peripheral neuropathy   . Insomnia   . Dementia   . Hypothyroid   . Anxiety disorder   . Arthritis   . Hypertension   . Depression     Medications:  Reviewed  Assessment: Empiric therapy. Already received 1 gram IV on 04-20-11 at 8pm. Also on Levaquin.  Goal of Therapy:  Vancomycin trough level 15-20 mcg/ml  Plan:  Measure antibiotic drug levels at steady state Follow up culture results Vancomycin 750mg  IV every 24 hours.  Gilman Buttner, [place] J 04/21/2011,7:36 AM

## 2011-04-21 NOTE — Progress Notes (Signed)
Subjective:  Patient seen and examined, CXR shows infiltrate vs atelecatsis, WBC is normal  Today, she is now afebrile.  Objective: Vital signs in last 24 hours: Temp:  [98 F (36.7 C)-98.3 F (36.8 C)] 98.1 F (36.7 C) (10/15 0500) Pulse Rate:  [75-83] 81  (10/15 0500) Resp:  [18-20] 20  (10/15 0500) BP: (102-127)/(51-68) 127/68 mmHg (10/15 0500) SpO2:  [93 %-99 %] 99 % (10/15 0500) FiO2 (%):  [93 %-96 %] 93 % (10/14 2124) Weight change:  Last BM Date: 04/20/11  Intake/Output from previous day: 10/14 0701 - 10/15 0700 In: 603 [P.O.:600; I.V.:3] Out: 375 [Urine:375]     Physical Exam: General: Alert, awake, oriented x3, in no acute distress. HEENT: No bruits, no goiter. Heart: Regular rate and rhythm, without murmurs, rubs, gallops. Lungs: Clear to auscultation bilaterally. Abdomen: Soft, nontender, nondistended, positive bowel sounds. Extremities: No clubbing cyanosis or edema. Neuro: Grossly intact, nonfocal.    Lab Results: Basic Metabolic Panel:  Basename 04/21/11 0643 04/19/11 0523  NA 143 145  K 4.8 4.9  CL 109 110  CO2 27 26  GLUCOSE 160* 133*  BUN 35* 27*  CREATININE 1.20* 1.40*  CALCIUM 8.7 8.8  MG -- --  PHOS -- --   Liver Function Tests: No results found for this basename: AST:2,ALT:2,ALKPHOS:2,BILITOT:2,PROT:2,ALBUMIN:2 in the last 72 hours No results found for this basename: LIPASE:2,AMYLASE:2 in the last 72 hours  Basename 04/20/11 1545  AMMONIA 44   CBC:  Basename 04/21/11 0643 04/20/11 0053  WBC 9.4 13.8*  NEUTROABS 7.7 12.0*  HGB 12.2 13.1  HCT 37.3 40.1  MCV 95.6 94.6  PLT 69* 77*   Cardiac Enzymes: No results found for this basename: CKTOTAL:3,CKMB:3,CKMBINDEX:3,TROPONINI:3 in the last 72 hours BNP: No results found for this basename: POCBNP:3 in the last 72 hours D-Dimer: No results found for this basename: DDIMER:2 in the last 72 hours CBG:  Basename 04/21/11 1201 04/21/11 0739 04/20/11 2049 04/20/11 1709 04/20/11 1121  04/20/11 0730  GLUCAP 118* 135* 155* 154* 183* 148*   Hemoglobin A1C: No results found for this basename: HGBA1C in the last 72 hours Fasting Lipid Panel: No results found for this basename: CHOL,HDL,LDLCALC,TRIG,CHOLHDL,LDLDIRECT in the last 72 hours Thyroid Function Tests:  Basename 04/18/11 1600  TSH 2.244  T4TOTAL --  FREET4 --  T3FREE --  THYROIDAB --    Recent Results (from the past 240 hour(s))  URINE CULTURE     Status: Normal   Collection Time   04/18/11  2:47 PM      Component Value Range Status Comment   Specimen Description URINE, CATHETERIZED   Final    Special Requests NONE   Final    Setup Time [id number]   Final    Colony Count NO GROWTH   Final    Culture NO GROWTH   Final    Report Status 04/20/2011 FINAL   Final   CULTURE, BLOOD (ROUTINE X 2)     Status: Normal (Preliminary result)   Collection Time   04/20/11 12:36 AM      Component Value Range Status Comment   Specimen Description BLOOD RIGHT ANTECUBITAL   Final    Special Requests BOTTLES DRAWN AEROBIC AND ANAEROBIC 10CC   Final    Culture NO GROWTH 1 DAY   Final    Report Status PENDING   Incomplete   CULTURE, BLOOD (ROUTINE X 2)     Status: Normal (Preliminary result)   Collection Time   04/20/11 12:53 AM  Component Value Range Status Comment   Specimen Description BLOOD BLOOD RIGHT HAND   Final    Special Requests BOTTLES DRAWN AEROBIC AND ANAEROBIC 7CC   Final    Culture NO GROWTH 1 DAY   Final    Report Status PENDING   Incomplete     Studies/Results: Dg Chest Portable 1 View  04/20/2011  *RADIOLOGY REPORT*  Clinical Data: Fever.  Possible aspiration.  PORTABLE CHEST - 1 VIEW  Comparison: 04/18/2011  Findings: 1525 hours.  Extreme lung apices have not been included on the film.  There is emphysema. Interstitial markings are diffusely coarsened with chronic features. Cardiopericardial silhouette is at upper limits of normal for size.  Slight elevation of the left hemidiaphragm noted  with some left base atelectasis or infiltrate.  Right lung is clear. Bones are diffusely demineralized.  IMPRESSION: Asymmetric elevation left hemidiaphragm with some left base atelectasis or infiltrate.  Original Report Authenticated By: ERIC A. MANSELL, M.D.    Medications: Scheduled Meds:   . acetaminophen  650 mg Rectal Once  . ceFAZolin (ANCEF) IV  1 g Intravenous Once  . fentaNYL  50 mcg Intravenous Once  . furosemide  20 mg Oral Daily  . insulin aspart  0-5 Units Subcutaneous QHS  . insulin aspart  0-9 Units Subcutaneous TID WC  . lactulose  10 g Oral TID  . levofloxacin (LEVAQUIN) IV  500 mg Intravenous Q24H  . levothyroxine  75 mcg Oral Daily  . metoprolol  25 mg Oral Daily  . ondansetron  4 mg Intravenous Once  . pantoprazole  40 mg Oral Q1200  . polyethylene glycol  17 g Oral Daily  . potassium chloride  10 mEq Oral BID  . povidone-iodine   Topical Once  . pregabalin  100 mg Oral TID  . rifaximin  550 mg Oral BID  . sodium chloride  3 mL Intravenous Q12H  . vancomycin  750 mg Intravenous Q24H  . DISCONTD: insulin aspart  3 Units Subcutaneous TID WC  . DISCONTD: vancomycin  1,000 mg Intravenous Q12H   Continuous Infusions:  PRN Meds:.acetaminophen, acetaminophen, HYDROmorphone, oxyCODONE, sodium chloride, DISCONTD: morphine  Assessment/Plan: Principal Problem:  *Hip fracture, right: Surgery postponed due to pneumonia, will need to hold the surgery till patient is more stabilized. Will plan for surgery on 10/17 Wednesday. Active Problems:  Fever of undetermined origin: CXR showed pneumonia, Fever now resolved. On Vanc and levaquin, will continue IV antibiotics. Altered mental status: Resolved. Continue PRN Oxycodone. Patient has h/o liver cirrhosis and is on lactulose, rifaximin.  Chronic pain: Patient takes Oxycodone at home, will start Oxy IR 5 mg 1-2 tabs po q 4 hr prn.  Hypothyroidism: continue synthroid.  Periphral neuropathy: Continue pregabalin.  Diabetes  mellitus:  Continue SSI Discussed code status with patient's son, wants cpr, defibrillation, mechanical ventilation. Does not want to be on prolonged life support.  DVT Prophylaxis: Lovenox. Stop Lovenox after Tuesday's dose.   LOS: 3 days   Alyrica Thurow S 04/21/2011, 1:26 PM

## 2011-04-22 ENCOUNTER — Inpatient Hospital Stay (HOSPITAL_COMMUNITY): Payer: PRIVATE HEALTH INSURANCE

## 2011-04-22 LAB — BASIC METABOLIC PANEL
BUN: 29 mg/dL — ABNORMAL HIGH (ref 6–23)
Calcium: 8.7 mg/dL (ref 8.4–10.5)
Creatinine, Ser: 1.26 mg/dL — ABNORMAL HIGH (ref 0.50–1.10)
GFR calc non Af Amer: 38 mL/min — ABNORMAL LOW (ref >90)
Glucose, Bld: 116 mg/dL — ABNORMAL HIGH (ref 70–99)
Sodium: 138 mEq/L (ref 135–145)

## 2011-04-22 LAB — CBC
Hemoglobin: 11.4 g/dL — ABNORMAL LOW (ref 12.0–15.0)
MCH: 31.5 pg (ref 26.0–34.0)
MCHC: 33.7 g/dL (ref 30.0–36.0)

## 2011-04-22 LAB — GLUCOSE, CAPILLARY: Glucose-Capillary: 158 mg/dL — ABNORMAL HIGH (ref 70–99)

## 2011-04-22 MED ORDER — CEFAZOLIN SODIUM 1-5 GM-% IV SOLN
1.0000 g | Freq: Once | INTRAVENOUS | Status: DC
  Filled 2011-04-22: qty 50

## 2011-04-22 MED ORDER — POVIDONE-IODINE 10 % EX SOLN
Freq: Once | CUTANEOUS | Status: AC
  Administered 2011-04-23: 05:00:00 via TOPICAL
  Filled 2011-04-22: qty 118

## 2011-04-22 MED ORDER — SODIUM CHLORIDE 0.9 % IJ SOLN
INTRAMUSCULAR | Status: AC
  Administered 2011-04-22: 10 mL
  Filled 2011-04-22: qty 10

## 2011-04-22 MED ORDER — SODIUM CHLORIDE 0.9 % IV SOLN
INTRAVENOUS | Status: AC
  Administered 2011-04-22: 12:00:00 via INTRAVENOUS

## 2011-04-22 MED ORDER — SODIUM CHLORIDE 0.9 % IJ SOLN
INTRAMUSCULAR | Status: AC
  Filled 2011-04-22: qty 10

## 2011-04-22 NOTE — Progress Notes (Signed)
Subjective: Patient seen and examined, complains of neck pain. Son concerned that she might have injured her neck when she fell at home.  Objective: Vital signs in last 24 hours: Temp:  [97.8 F (36.6 C)-98 F (36.7 C)] 97.9 F (36.6 C) (10/16 0703) Pulse Rate:  [71-98] 71  (10/16 0703) Resp:  [20] 20  (10/16 0703) BP: (94-103)/(54-62) 103/62 mmHg (10/16 0703) SpO2:  [97 %-98 %] 97 % (10/16 0703) Weight change:  Last BM Date: 04/20/11  Intake/Output from previous day: 10/15 0701 - 10/16 0700 In: 350 [IV Piggyback:350] Out: 350 [Urine:350]     Physical Exam: General: Alert, awake, oriented x3, in no acute distress. HEENT: No bruits, no goiter, Positive neck tenderness on palpation. Heart: Regular rate and rhythm, without murmurs, rubs, gallops. Lungs: Clear to auscultation bilaterally. Abdomen: Soft, nontender, nondistended, positive bowel sounds. Extremities: No clubbing cyanosis or edema with positive pedal pulses. Neuro: Grossly intact, nonfocal.    Lab Results: Basic Metabolic Panel:  Basename 04/21/11 0643  NA 143  K 4.8  CL 109  CO2 27  GLUCOSE 160*  BUN 35*  CREATININE 1.20*  CALCIUM 8.7  MG --  PHOS --   Liver Function Tests: No results found for this basename: AST:2,ALT:2,ALKPHOS:2,BILITOT:2,PROT:2,ALBUMIN:2 in the last 72 hours No results found for this basename: LIPASE:2,AMYLASE:2 in the last 72 hours  Basename 04/20/11 1545  AMMONIA 44   CBC:  Basename 04/21/11 0643 04/20/11 0053  WBC 9.4 13.8*  NEUTROABS 7.7 12.0*  HGB 12.2 13.1  HCT 37.3 40.1  MCV 95.6 94.6  PLT 69* 77*   Cardiac Enzymes: No results found for this basename: CKTOTAL:3,CKMB:3,CKMBINDEX:3,TROPONINI:3 in the last 72 hours BNP: No results found for this basename: POCBNP:3 in the last 72 hours D-Dimer: No results found for this basename: DDIMER:2 in the last 72 hours CBG:  Basename 04/22/11 0747 04/21/11 2045 04/21/11 1601 04/21/11 1201 04/21/11 0739 04/20/11 2049    GLUCAP 117* 145* 185* 118* 135* 155*    Recent Results (from the past 240 hour(s))  URINE CULTURE     Status: Normal   Collection Time   04/18/11  2:47 PM      Component Value Range Status Comment   Specimen Description URINE, CATHETERIZED   Final    Special Requests NONE   Final    Setup Time [id number]   Final    Colony Count NO GROWTH   Final    Culture NO GROWTH   Final    Report Status 04/20/2011 FINAL   Final   CULTURE, BLOOD (ROUTINE X 2)     Status: Normal (Preliminary result)   Collection Time   04/20/11 12:36 AM      Component Value Range Status Comment   Specimen Description BLOOD RIGHT ANTECUBITAL   Final    Special Requests BOTTLES DRAWN AEROBIC AND ANAEROBIC 10CC   Final    Culture NO GROWTH 2 DAYS   Final    Report Status PENDING   Incomplete   CULTURE, BLOOD (ROUTINE X 2)     Status: Normal (Preliminary result)   Collection Time   04/20/11 12:53 AM      Component Value Range Status Comment   Specimen Description BLOOD BLOOD RIGHT HAND   Final    Special Requests BOTTLES DRAWN AEROBIC AND ANAEROBIC 7CC   Final    Culture NO GROWTH 2 DAYS   Final    Report Status PENDING   Incomplete     Studies/Results: Dg Chest Portable 1 View  04/20/2011  *RADIOLOGY REPORT*  Clinical Data: Fever.  Possible aspiration.  PORTABLE CHEST - 1 VIEW  Comparison: 04/18/2011  Findings: 1525 hours.  Extreme lung apices have not been included on the film.  There is emphysema. Interstitial markings are diffusely coarsened with chronic features. Cardiopericardial silhouette is at upper limits of normal for size.  Slight elevation of the left hemidiaphragm noted with some left base atelectasis or infiltrate.  Right lung is clear. Bones are diffusely demineralized.  IMPRESSION: Asymmetric elevation left hemidiaphragm with some left base atelectasis or infiltrate.  Original Report Authenticated By: ERIC A. MANSELL, M.D.    Medications: Scheduled Meds:   . acetaminophen  650 mg Rectal  Once  . ceFAZolin (ANCEF) IV  1 g Intravenous Once  . ceFAZolin (ANCEF) IV  1 g Intravenous Once  . enoxaparin  40 mg Subcutaneous Q24H  . fentaNYL  50 mcg Intravenous Once  . furosemide  20 mg Oral Daily  . insulin aspart  0-5 Units Subcutaneous QHS  . insulin aspart  0-9 Units Subcutaneous TID WC  . lactulose  10 g Oral TID  . levofloxacin (LEVAQUIN) IV  500 mg Intravenous Q24H  . levothyroxine  75 mcg Oral Daily  . metoprolol  25 mg Oral Daily  . ondansetron  4 mg Intravenous Once  . pantoprazole  40 mg Oral Q1200  . polyethylene glycol  17 g Oral Daily  . potassium chloride  10 mEq Oral BID  . povidone-iodine   Topical Once  . pregabalin  100 mg Oral TID  . rifaximin  550 mg Oral BID  . sodium chloride  3 mL Intravenous Q12H  . sodium chloride      . vancomycin  750 mg Intravenous Q24H   Continuous Infusions:  PRN Meds:.acetaminophen, acetaminophen, HYDROmorphone, oxyCODONE, sodium chloride  Assessment/Plan:  Principal Problem:  *Hip fracture, right Patient is scheduled to go for surgery in am. Active Problems: Pneumonia due to infectious agent: Patient is currently on Vancomycin and Levaquin, Surgery was held due to pneumonia.  Thrombocytopenia: Secondary to Liver disease. May require platelet transfusion if platelets continue to drop.  Will check CBc and BMP today. Acute renal failure: Improved ,  will follow patient's BUN /Creatinine.    LOS: 4 days   Mylon Mabey S 04/22/2011, 11:48 AM

## 2011-04-22 NOTE — Progress Notes (Signed)
She remains afebrile.  Vital signs stable. Pain controlled.  Plan surgery of the right hip tomorrow around lunch time.  Family aware.  She is at increased risk secondary to general condition.  Family also aware of this.

## 2011-04-23 ENCOUNTER — Inpatient Hospital Stay (HOSPITAL_COMMUNITY): Payer: PRIVATE HEALTH INSURANCE

## 2011-04-23 ENCOUNTER — Encounter (HOSPITAL_COMMUNITY): Payer: Self-pay | Admitting: Anesthesiology

## 2011-04-23 ENCOUNTER — Encounter (HOSPITAL_COMMUNITY)
Admission: EM | Disposition: A | Discharge status: Skilled Nursing Facility | Payer: Self-pay | Source: Home / Self Care | Attending: Family Medicine

## 2011-04-23 ENCOUNTER — Inpatient Hospital Stay (HOSPITAL_COMMUNITY): Payer: PRIVATE HEALTH INSURANCE | Admitting: Anesthesiology

## 2011-04-23 HISTORY — PX: ORIF HIP FRACTURE: SHX2125

## 2011-04-23 LAB — GLUCOSE, CAPILLARY
Glucose-Capillary: 135 mg/dL — ABNORMAL HIGH (ref 70–99)
Glucose-Capillary: 174 mg/dL — ABNORMAL HIGH (ref 70–99)

## 2011-04-23 LAB — DIFFERENTIAL
Basophils Absolute: 0 10*3/uL (ref 0.0–0.1)
Eosinophils Absolute: 0.3 10*3/uL (ref 0.0–0.7)
Eosinophils Relative: 5 % (ref 0–5)

## 2011-04-23 LAB — CBC
MCH: 30.7 pg (ref 26.0–34.0)
MCHC: 33.1 g/dL (ref 30.0–36.0)
MCV: 92.7 fL (ref 78.0–100.0)
Platelets: 76 10*3/uL — ABNORMAL LOW (ref 150–400)
RDW: 13.3 % (ref 11.5–15.5)
WBC: 6.3 10*3/uL (ref 4.0–10.5)

## 2011-04-23 SURGERY — OPEN REDUCTION INTERNAL FIXATION HIP
Anesthesia: Spinal | Site: Hip | Laterality: Right | Wound class: Clean

## 2011-04-23 MED ORDER — LIDOCAINE HCL (PF) 1 % IJ SOLN
INTRAMUSCULAR | Status: AC
  Filled 2011-04-23: qty 5

## 2011-04-23 MED ORDER — LACTATED RINGERS IV SOLN
INTRAVENOUS | Status: DC | PRN
  Administered 2011-04-23: 12:00:00 via INTRAVENOUS

## 2011-04-23 MED ORDER — FENTANYL CITRATE 0.05 MG/ML IJ SOLN
INTRAMUSCULAR | Status: DC | PRN
  Administered 2011-04-23: 50 ug via INTRAVENOUS
  Administered 2011-04-23: 20 ug via INTRATHECAL

## 2011-04-23 MED ORDER — SODIUM CHLORIDE 0.9 % IR SOLN
Status: DC | PRN
  Administered 2011-04-23: 1000 mL

## 2011-04-23 MED ORDER — ALBUTEROL SULFATE (5 MG/ML) 0.5% IN NEBU
2.5000 mg | INHALATION_SOLUTION | Freq: Four times a day (QID) | RESPIRATORY_TRACT | Status: DC
  Administered 2011-04-23 – 2011-04-25 (×6): 2.5 mg via RESPIRATORY_TRACT
  Filled 2011-04-23 (×2): qty 20
  Filled 2011-04-23: qty 0.5
  Filled 2011-04-23: qty 20
  Filled 2011-04-23: qty 0.5
  Filled 2011-04-23 (×2): qty 20
  Filled 2011-04-23: qty 0.5
  Filled 2011-04-23 (×4): qty 20
  Filled 2011-04-23 (×2): qty 0.5
  Filled 2011-04-23: qty 20
  Filled 2011-04-23: qty 0.5
  Filled 2011-04-23 (×2): qty 20

## 2011-04-23 MED ORDER — MAGNESIUM HYDROXIDE 400 MG/5ML PO SUSP
30.0000 mL | Freq: Every day | ORAL | Status: DC | PRN
  Filled 2011-04-23: qty 30

## 2011-04-23 MED ORDER — CEFAZOLIN SODIUM 1-5 GM-% IV SOLN
INTRAVENOUS | Status: DC | PRN
  Administered 2011-04-23: 1 g via INTRAVENOUS

## 2011-04-23 MED ORDER — FENTANYL CITRATE 0.05 MG/ML IJ SOLN
25.0000 ug | INTRAMUSCULAR | Status: DC | PRN
  Administered 2011-04-23: 25 ug via INTRAVENOUS

## 2011-04-23 MED ORDER — LACTATED RINGERS IV SOLN
INTRAVENOUS | Status: DC
  Administered 2011-04-23: 1000 mL via INTRAVENOUS
  Administered 2011-04-23: 1000 via INTRAVENOUS

## 2011-04-23 MED ORDER — ACETAMINOPHEN 325 MG PO TABS
650.0000 mg | ORAL_TABLET | Freq: Four times a day (QID) | ORAL | Status: DC | PRN
  Administered 2011-04-26 – 2011-04-27 (×2): 650 mg via ORAL
  Filled 2011-04-23 (×2): qty 2

## 2011-04-23 MED ORDER — FENTANYL CITRATE 0.05 MG/ML IJ SOLN
INTRAMUSCULAR | Status: AC
  Administered 2011-04-23: 25 ug via INTRAVENOUS
  Filled 2011-04-23: qty 2

## 2011-04-23 MED ORDER — BUPIVACAINE IN DEXTROSE 0.75-8.25 % IT SOLN
INTRATHECAL | Status: AC
  Filled 2011-04-23: qty 2

## 2011-04-23 MED ORDER — MIDAZOLAM HCL 2 MG/2ML IJ SOLN
INTRAMUSCULAR | Status: AC
  Administered 2011-04-23: 2 mg via INTRAVENOUS
  Filled 2011-04-23: qty 2

## 2011-04-23 MED ORDER — PROMETHAZINE HCL 25 MG/ML IJ SOLN: 12.5000 mg | INTRAMUSCULAR | Status: DC | PRN

## 2011-04-23 MED ORDER — PROPOFOL 10 MG/ML IV EMUL
INTRAVENOUS | Status: AC
  Filled 2011-04-23: qty 20

## 2011-04-23 MED ORDER — ZOLPIDEM TARTRATE 5 MG PO TABS
5.0000 mg | ORAL_TABLET | Freq: Every day | ORAL | Status: DC
  Administered 2011-04-23 – 2011-04-24 (×2): 5 mg via ORAL
  Filled 2011-04-23 (×2): qty 1

## 2011-04-23 MED ORDER — FENTANYL CITRATE 0.05 MG/ML IJ SOLN: 25.0000 ug | INTRAMUSCULAR | Status: DC | PRN

## 2011-04-23 MED ORDER — EPHEDRINE SULFATE 50 MG/ML IJ SOLN
INTRAMUSCULAR | Status: AC
  Filled 2011-04-23: qty 1

## 2011-04-23 MED ORDER — PHENYLEPHRINE HCL 10 MG/ML IJ SOLN
INTRAMUSCULAR | Status: AC
  Filled 2011-04-23: qty 1

## 2011-04-23 MED ORDER — FENTANYL CITRATE 0.05 MG/ML IJ SOLN
INTRAMUSCULAR | Status: AC
  Filled 2011-04-23: qty 2

## 2011-04-23 MED ORDER — MIDAZOLAM HCL 2 MG/2ML IJ SOLN
1.0000 mg | INTRAMUSCULAR | Status: DC | PRN
  Administered 2011-04-23: 2 mg via INTRAVENOUS

## 2011-04-23 MED ORDER — RIVAROXABAN 10 MG PO TABS
10.0000 mg | ORAL_TABLET | Freq: Every day | ORAL | Status: DC
  Administered 2011-04-24 – 2011-04-28 (×5): 10 mg via ORAL
  Filled 2011-04-23 (×6): qty 1

## 2011-04-23 MED ORDER — BUPIVACAINE HCL 0.75 % IJ SOLN
INTRAMUSCULAR | Status: DC | PRN
  Administered 2011-04-23: 15 mg via INTRATHECAL

## 2011-04-23 MED ORDER — PHENYLEPHRINE HCL 10 MG/ML IJ SOLN
INTRAMUSCULAR | Status: DC | PRN
Start: 2011-04-23 — End: 2011-04-23
  Administered 2011-04-23 (×2): 40 ug via INTRAVENOUS
  Administered 2011-04-23 (×6): 80 ug via INTRAVENOUS

## 2011-04-23 MED ORDER — PROPOFOL 10 MG/ML IV EMUL
INTRAVENOUS | Status: DC | PRN
  Administered 2011-04-23: 20 ug/kg/min via INTRAVENOUS

## 2011-04-23 MED ORDER — ONDANSETRON HCL 4 MG/2ML IJ SOLN: 4.0000 mg | Freq: Once | INTRAMUSCULAR | Status: DC | PRN

## 2011-04-23 MED ORDER — EPHEDRINE SULFATE 50 MG/ML IJ SOLN
INTRAMUSCULAR | Status: DC | PRN
  Administered 2011-04-23: 10 mg via INTRAVENOUS

## 2011-04-23 SURGICAL SUPPLY — 56 items
BAG HAMPER (MISCELLANEOUS) ×2 IMPLANT
BIT DRILL 6.5 DISP STRL (BIT) ×2 IMPLANT
BIT DRILL TWIST 3.5MM (BIT) IMPLANT
BLADE SURG SZ10 CARB STEEL (BLADE) ×4 IMPLANT
BLADE SURG SZ20 CARB STEEL (BLADE) ×2 IMPLANT
CLOTH BEACON ORANGE TIMEOUT ST (SAFETY) ×2 IMPLANT
COVER LIGHT HANDLE STERIS (MISCELLANEOUS) ×4 IMPLANT
COVER MAYO STAND XLG (DRAPE) ×2 IMPLANT
DRAPE STERI IOBAN 125X83 (DRAPES) ×2 IMPLANT
DRILL OMEGA BONE (BIT) ×1 IMPLANT
DRILL TWIST 3.5MM (BIT) ×2
ELECT REM PT RETURN 9FT ADLT (ELECTROSURGICAL) ×2
ELECTRODE REM PT RTRN 9FT ADLT (ELECTROSURGICAL) ×1 IMPLANT
EVACUATOR 3/16  PVC DRAIN (DRAIN) ×1
EVACUATOR 3/16 PVC DRAIN (DRAIN) ×1 IMPLANT
GAUZE XEROFORM 5X9 LF (GAUZE/BANDAGES/DRESSINGS) ×2 IMPLANT
GLOVE BIO SURGEON STRL SZ8 (GLOVE) ×2 IMPLANT
GLOVE BIO SURGEON STRL SZ8.5 (GLOVE) ×2 IMPLANT
GLOVE ECLIPSE 6.5 STRL STRAW (GLOVE) ×1 IMPLANT
GLOVE ECLIPSE 7.0 STRL STRAW (GLOVE) ×2 IMPLANT
GLOVE EXAM NITRILE MD LF STRL (GLOVE) ×1 IMPLANT
GLOVE INDICATOR 7.0 STRL GRN (GLOVE) ×2 IMPLANT
GLOVE INDICATOR 7.5 STRL GRN (GLOVE) ×1 IMPLANT
GOWN BRE IMP SLV AUR XL STRL (GOWN DISPOSABLE) ×5 IMPLANT
GOWN STRL REIN XL XLG (GOWN DISPOSABLE) ×2 IMPLANT
GUIDE PIN CALIBRATED (PIN) ×2 IMPLANT
GUIDE PIN CALIBRATED 2.4X23 (PIN) ×1 IMPLANT
INST SET MAJOR BONE (KITS) ×2 IMPLANT
KIT BLADEGUARD II DBL (SET/KITS/TRAYS/PACK) ×2 IMPLANT
KIT ROOM TURNOVER AP CYSTO (KITS) ×2 IMPLANT
MANIFOLD NEPTUNE II (INSTRUMENTS) ×2 IMPLANT
MARKER SKIN DUAL TIP RULER LAB (MISCELLANEOUS) ×2 IMPLANT
NS IRRIG 1000ML POUR BTL (IV SOLUTION) ×2 IMPLANT
PACK BASIC III (CUSTOM PROCEDURE TRAY) ×2
PACK SRG BSC III STRL LF ECLPS (CUSTOM PROCEDURE TRAY) ×1 IMPLANT
PAD ABD 5X9 TENDERSORB (GAUZE/BANDAGES/DRESSINGS) ×4 IMPLANT
PAD ARMBOARD 7.5X6 YLW CONV (MISCELLANEOUS) ×2 IMPLANT
PENCIL HANDSWITCHING (ELECTRODE) ×2 IMPLANT
PLATE SHORT BARREL 135X4 (Plate) ×1 IMPLANT
SCREW CORTICAL SFTP 4.5X34MM (Screw) ×1 IMPLANT
SCREW CORTICAL SFTP 4.5X38MM (Screw) ×2 IMPLANT
SCREW CORTICAL SFTP 4.5X40MM (Screw) ×1 IMPLANT
SCREW LAG 90MM (Screw) ×2 IMPLANT
SCREW LAGSTD 90X21X12.7X9 (Screw) IMPLANT
SET BASIN LINEN APH (SET/KITS/TRAYS/PACK) ×2 IMPLANT
SPONGE DRAIN TRACH 4X4 STRL 2S (GAUZE/BANDAGES/DRESSINGS) ×2 IMPLANT
SPONGE GAUZE 4X4 12PLY (GAUZE/BANDAGES/DRESSINGS) ×2 IMPLANT
SPONGE LAP 18X18 X RAY DECT (DISPOSABLE) ×4 IMPLANT
STAPLER VISISTAT 35W (STAPLE) ×2 IMPLANT
SUT BRALON NAB BRD #1 30IN (SUTURE) ×5 IMPLANT
SUT PLAIN 2 0 XLH (SUTURE) ×2 IMPLANT
SUT SILK 0 FSL (SUTURE) ×2 IMPLANT
SYR BULB IRRIGATION 50ML (SYRINGE) ×2 IMPLANT
TAPE MEDIFIX FOAM 3 (GAUZE/BANDAGES/DRESSINGS) ×2 IMPLANT
YANKAUER SUCT 12FT TUBE ARGYLE (SUCTIONS) ×2 IMPLANT
YANKAUER SUCT BULB TIP 10FT TU (MISCELLANEOUS) ×2 IMPLANT

## 2011-04-23 NOTE — Transfer of Care (Signed)
Immediate Anesthesia Transfer of Care Note  Patient: [name]  Procedure(s) Performed:  OPEN REDUCTION INTERNAL FIXATION HIP  Patient Location: PACU  Anesthesia Type: Spinal  Level of Consciousness: awake and patient cooperative  Airway & Oxygen Therapy: Patient Spontanous Breathing and Patient connected to nasal cannula oxygen  Post-op Assessment: Report given to PACU RN and Post -op Vital signs reviewed and stable  Post vital signs: stable  Complications: No apparent anesthesia complications

## 2011-04-23 NOTE — Brief Op Note (Signed)
04/18/2011 - 04/23/2011  1:39 PM  PATIENT:  [name]  77 y.o. female  PRE-OPERATIVE DIAGNOSIS:  right hip fracture  POST-OPERATIVE DIAGNOSIS:  right hip fracture  PROCEDURE:  Procedure(s): OPEN REDUCTION INTERNAL FIXATION HIP with Peggye Pitt Compression Screw System, 90 mm length compression screw, 135 degree short barrel plate, screws from 40 to 34 mm.  SURGEON:  Surgeon(s): Ardean Simonich  PHYSICIAN ASSISTANT:   ASSISTANTS: none   ANESTHESIA:   spinal  EBL:  Total I/O In: 550 [I.V.:550] Out: 450 [Urine:350; Blood:100]  BLOOD ADMINISTERED:none  DRAINS: Hemovac drain large   LOCAL MEDICATIONS USED:  NONE  SPECIMEN:  No Specimen  DISPOSITION OF SPECIMEN:  N/A  COUNTS:  YES  TOURNIQUET:  * No tourniquets in log *  DICTATION: .Other Dictation: Dictation Number [id number]  PLAN OF CARE: Admit to inpatient   PATIENT DISPOSITION:  PACU - hemodynamically stable.   Delay start of Pharmacological VTE agent (>24hrs) due to surgical blood loss or risk of bleeding:  no

## 2011-04-23 NOTE — Anesthesia Procedure Notes (Signed)
Spinal Block  Patient location during procedure: OR Start time: 04/23/2011 12:03 PM Staffing CRNA/Resident: ANDRAZA, Carlas Vandyne Preanesthetic Checklist Completed: patient identified, site marked, surgical consent, pre-op evaluation, timeout performed, IV checked, risks and benefits discussed and monitors and equipment checked Spinal Block Patient position: right lateral decubitus Prep: Betadine and site prepped and draped Patient monitoring: heart rate, cardiac monitor, continuous pulse ox and blood pressure Approach: right paramedian Location: L3-4 Injection technique: single-shot Needle Needle type: Spinocan  Needle gauge: 22 G Needle length: 9 cm Assessment Sensory level: T8 Additional Notes Marcaine .75% 2cc with epi 07/998 .1cc  And fentanyl 20 mcg injected; CSF aspirated pre and post injection. Lot [id number]  Exp. 02/24/12

## 2011-04-23 NOTE — Anesthesia Preprocedure Evaluation (Addendum)
Anesthesia Evaluation  Name, MR# and DOB Patient unresponsive  General Assessment Comment  Reviewed: Allergy & Precautions, H&P , NPO status , Patient's Chart, lab work & pertinent test results, reviewed documented beta blocker date and time   History of Anesthesia Complications Negative for: history of anesthetic complications  Airway Mallampati: II      Dental  (+) Edentulous Upper and Edentulous Lower   Pulmonary    + decreased breath sounds      Cardiovascular hypertension, Pt. on medications Regular Normal    Neuro/Psych PSYCHIATRIC DISORDERS Anxiety Depression  Neuromuscular disease (painful severe peripheral neuropathy)    GI/Hepatic GERD Medicated(+) Cirrhosis -       ,   Endo/Other  Hypothyroidism   Renal/GU      Musculoskeletal  (+) Arthritis -, Osteoarthritis,    Abdominal   Peds  Hematology   Anesthesia Other Findings   Reproductive/Obstetrics                          Anesthesia Physical Anesthesia Plan  ASA: III  Anesthesia Plan: Spinal   Post-op Pain Management:    Induction:   Airway Management Planned: Nasal Cannula  Additional Equipment:   Intra-op Plan:   Post-operative Plan:   Informed Consent: I have reviewed the patients History and Physical, chart, labs and discussed the procedure including the risks, benefits and alternatives for the proposed anesthesia with the patient or authorized representative who has indicated his/her understanding and acceptance.     Plan Discussed with:   Anesthesia Plan Comments: (Marcaine, epi, fentanyl SAB.)        Anesthesia Quick Evaluation

## 2011-04-23 NOTE — Progress Notes (Signed)
Subjective:  Patient seen and examined today.  She is just back from surgery.  She complains of not feeling well. Objective: Vital signs in last 24 hours: Temp:  [97.5 F (36.4 C)-99.1 F (37.3 C)] 97.9 F (36.6 C) (10/17 1616) Pulse Rate:  [74-86] 80  (10/17 1616) Resp:  [14-24] 20  (10/17 1616) BP: (76-118)/(29-62) 100/56 mmHg (10/17 1616) SpO2:  [93 %-100 %] 96 % (10/17 1616) Weight change:  Last BM Date: 04/20/11  Intake/Output from previous day: 10/16 0701 - 10/17 0700 In: 282.5 [I.V.:282.5] Out: 200 [Urine:200] Total I/O In: 800 [I.V.:800] Out: 470 [Urine:350; Drains:20; Blood:100]   Physical Exam: General: Alert, awake, oriented x3, in no acute distress. HEENT: No bruits, no goiter. Heart: Regular rate and rhythm, without murmurs, rubs, gallops. Lungs: Clear to auscultation bilaterally. Abdomen: Soft, nontender, nondistended, positive bowel sounds. Extremities: No clubbing cyanosis or edema. Neuro: Grossly intact, nonfocal.    Lab Results: Basic Metabolic Panel:  Basename 04/22/11 1207 04/21/11 0643  NA 138 143  K 4.3 4.8  CL 104 109  CO2 28 27  GLUCOSE 116* 160*  BUN 29* 35*  CREATININE 1.26* 1.20*  CALCIUM 8.7 8.7  MG -- --  PHOS -- --   Liver Function Tests: No results found for this basename: AST:2,ALT:2,ALKPHOS:2,BILITOT:2,PROT:2,ALBUMIN:2 in the last 72 hours No results found for this basename: LIPASE:2,AMYLASE:2 in the last 72 hours No results found for this basename: AMMONIA:2 in the last 72 hours CBC:  Basename 04/23/11 0520 04/22/11 1207 04/21/11 0643  WBC 6.3 7.7 --  NEUTROABS 4.6 -- 7.7  HGB 11.0* 11.4* --  HCT 33.2* 33.8* --  MCV 92.7 93.4 --  PLT 76* 73* --   Cardiac Enzymes: No results found for this basename: CKTOTAL:3,CKMB:3,CKMBINDEX:3,TROPONINI:3 in the last 72 hours BNP: No results found for this basename: POCBNP:3 in the last 72 hours D-Dimer: No results found for this basename: DDIMER:2 in the last 72  hours CBG:  Basename 04/23/11 1627 04/23/11 0749 04/22/11 2122 04/22/11 1229 04/22/11 0747 04/21/11 2045  GLUCAP 135* 102* 158* 126* 117* 145*   Hemoglobin A1C: No results found for this basename: HGBA1C in the last 72 hours Fasting Lipid Panel: No results found for this basename: CHOL,HDL,LDLCALC,TRIG,CHOLHDL,LDLDIRECT in the last 72 hours Thyroid Function Tests: No results found for this basename: TSH,T4TOTAL,FREET4,T3FREE,THYROIDAB in the last 72 hours  Recent Results (from the past 240 hour(s))  URINE CULTURE     Status: Normal   Collection Time   04/18/11  2:47 PM      Component Value Range Status Comment   Specimen Description URINE, CATHETERIZED   Final    Special Requests NONE   Final    Setup Time [id number]   Final    Colony Count NO GROWTH   Final    Culture NO GROWTH   Final    Report Status 04/20/2011 FINAL   Final   CULTURE, BLOOD (ROUTINE X 2)     Status: Normal (Preliminary result)   Collection Time   04/20/11 12:36 AM      Component Value Range Status Comment   Specimen Description BLOOD RIGHT ANTECUBITAL   Final    Special Requests BOTTLES DRAWN AEROBIC AND ANAEROBIC 10CC   Final    Culture NO GROWTH 3 DAYS   Final    Report Status PENDING   Incomplete   CULTURE, BLOOD (ROUTINE X 2)     Status: Normal (Preliminary result)   Collection Time   04/20/11 12:53 AM  Component Value Range Status Comment   Specimen Description BLOOD RIGHT HAND   Final    Special Requests BOTTLES DRAWN AEROBIC AND ANAEROBIC 7CC   Final    Culture NO GROWTH 3 DAYS   Final    Report Status PENDING   Incomplete     Studies/Results: Dg Cervical Spine 2-3 Views  04/22/2011  *RADIOLOGY REPORT*  Clinical Data: Back pain, fell at home, femoral fracture  CERVICAL SPINE - 2-3 VIEW  Comparison: Portable AP and lateral views the cervical spine obtained.  Findings: Examination to by portable technique and lack of oblique views. Bones are diffusely demineralized. Scattered facet  degenerative changes. Vertebral body heights maintained. Scattered disc space narrowing minimal endplate spurring. No gross evidence of cervical spine fracture or identified on limited assessment. Marginal retrolisthesis of C4 on C5, likely degenerative. Prevertebral soft tissues normal thickness. Atherosclerotic calcification aorta. Odontoid process appears intact.  IMPRESSION: Osseous demineralization with scattered facet degenerative changes and minimal degenerative disc disease changes. No definite acute cervical spine abnormalities identified on portable limited exam.  Original Report Authenticated By: Lollie Marrow, M.D.   Dg Hip Operative Right  04/23/2011  *RADIOLOGY REPORT*  Clinical Data: Hip fracture.  OPERATIVE RIGHT HIP  Comparison: Plain films 04/18/2011.  Findings: We are provided with three fluoroscopic intraoperative spot views of the right hip.  Images demonstrate placement of a dynamic hip screw for fixation of an intertrochanteric fracture. Position and alignment appear near anatomic.  IMPRESSION: ORIF right intertrochanteric fracture.  Original Report Authenticated By: Bernadene Bell. Maricela Curet, M.D.    Medications: Scheduled Meds:    . acetaminophen  650 mg Rectal Once  . albuterol  2.5 mg Nebulization Q6H  . bupivacaine 0.75% in dextrose 8.25% (intrathecal)      . ceFAZolin (ANCEF) IV  1 g Intravenous Once  . ePHEDrine      . fentaNYL  50 mcg Intravenous Once  . fentaNYL      . furosemide  20 mg Oral Daily  . insulin aspart  0-5 Units Subcutaneous QHS  . insulin aspart  0-9 Units Subcutaneous TID WC  . lactulose  10 g Oral TID  . levofloxacin (LEVAQUIN) IV  500 mg Intravenous Q24H  . levothyroxine  75 mcg Oral Daily  . lidocaine      . metoprolol  25 mg Oral Daily  . ondansetron  4 mg Intravenous Once  . pantoprazole  40 mg Oral Q1200  . phenylephrine      . polyethylene glycol  17 g Oral Daily  . potassium chloride  10 mEq Oral BID  . povidone-iodine   Topical Once   . pregabalin  100 mg Oral TID  . propofol      . rifaximin  550 mg Oral BID  . rivaroxaban  10 mg Oral Daily  . sodium chloride  3 mL Intravenous Q12H  . vancomycin  750 mg Intravenous Q24H  . zolpidem  5 mg Oral QHS   Continuous Infusions:    . sodium chloride 50 mL/hr at 04/22/11 1221  . DISCONTD: lactated ringers 1,000 application (04/23/11 1526)   PRN Meds:.acetaminophen, HYDROmorphone, magnesium hydroxide, oxyCODONE, promethazine, sodium chloride, DISCONTD: acetaminophen, DISCONTD: acetaminophen, DISCONTD: fentaNYL, DISCONTD: fentaNYL, DISCONTD: midazolam, DISCONTD: ondansetron (ZOFRAN) IV, DISCONTD: sodium chloride  Assessment/Plan: Hip fracture, right:  Surgery done on 10/17 Wednesday. Pneumonia: Fever now resolved. On Vanc and levaquin, will continue IV antibiotics. Altered mental status: Resolved.  Cirrhosis: Patient has h/o liver cirrhosis and is on lactulose, rifaximin.  Chronic  pain: Patient takes Oxycodone at home, will start Oxy IR 5 mg 1-2 tabs po q 4 hr prn.  Hypothyroidism: continue synthroid.  Periphral neuropathy: Continue pregabalin.  Diabetes mellitus:  Continue SSI Discussed code status with patient's son, wants cpr, defibrillation, mechanical ventilation. Does not want to be on prolonged life support.  DVT Prophylaxis: SCD,   LOS: 5 days   Earlene Plater MD, Ladell Pier 04/23/2011, 5:34 PM

## 2011-04-23 NOTE — Anesthesia Postprocedure Evaluation (Signed)
  Anesthesia Post-op Note  Patient: [name]  Procedure(s) Performed:  OPEN REDUCTION INTERNAL FIXATION HIP  Patient Location: PACU  Anesthesia Type: Spinal  Level of Consciousness: awake and patient cooperative  Airway and Oxygen Therapy: Patient Spontanous Breathing and Patient connected to nasal cannula oxygen  Post-op Pain: none  Post-op Assessment: Post-op Vital signs reviewed, Patient's Cardiovascular Status Stable, Respiratory Function Stable, Patent Airway and No signs of Nausea or vomiting  Post-op Vital Signs: stable  Complications: No apparent anesthesia complications

## 2011-04-23 NOTE — Progress Notes (Signed)
ANTIBIOTIC CONSULT NOTE   Pharmacy Consult for Vancomycin Indication: rule out pneumonia  Allergies  Allergen Reactions  . Aspirin     hives  . Codeine     REACTION: UNKNOWN REACTION  . Penicillins   . Sulfonamide Derivatives     REACTION: UNKNOWN REACTION  . Zolpidem Tartrate     REACTION: UNKNOWN REACTION   Patient Measurements: Height: 5\' 4"  (162.6 cm) Weight: 134 lb 14.7 oz (61.2 kg) IBW/kg (Calculated) : 54.7  Adjusted Body Weight: N/A  Vital Signs: Temp: 98.4 F (36.9 C) (10/17 0500) BP: 102/57 mmHg (10/17 0910) Pulse Rate: 79  (10/17 0910) Intake/Output from previous day: 10/16 0701 - 10/17 0700 In: 282.5 [I.V.:282.5] Out: 200 [Urine:200] Intake/Output from this shift: Total I/O In: 0  Out: 150 [Urine:150]  Labs:  [facility] 04/23/11 0520 04/22/11 1207 04/21/11 0643  WBC 6.3 7.7 9.4  HGB 11.0* 11.4* 12.2  PLT 76* 73* 69*  LABCREA -- -- --  CREATININE -- 1.26* 1.20*   Estimated Creatinine Clearance: 28.2 ml/min (by C-G formula based on Cr of 1.26).   Microbiology: Recent Results (from the past 720 hour(s))  URINE CULTURE     Status: Normal   Collection Time   04/18/11  2:47 PM      Component Value Range Status Comment   Specimen Description URINE, CATHETERIZED   Final    Special Requests NONE   Final    Setup Time [id number]   Final    Colony Count NO GROWTH   Final    Culture NO GROWTH   Final    Report Status 04/20/2011 FINAL   Final   CULTURE, BLOOD (ROUTINE X 2)     Status: Normal (Preliminary result)   Collection Time   04/20/11 12:36 AM      Component Value Range Status Comment   Specimen Description BLOOD RIGHT ANTECUBITAL   Final    Special Requests BOTTLES DRAWN AEROBIC AND ANAEROBIC 10CC   Final    Culture NO GROWTH 2 DAYS   Final    Report Status PENDING   Incomplete   CULTURE, BLOOD (ROUTINE X 2)     Status: Normal (Preliminary result)   Collection Time   04/20/11 12:53 AM      Component Value Range Status Comment   Specimen  Description BLOOD BLOOD RIGHT HAND   Final    Special Requests BOTTLES DRAWN AEROBIC AND ANAEROBIC 7CC   Final    Culture NO GROWTH 2 DAYS   Final    Report Status PENDING   Incomplete     Medical History: Past Medical History  Diagnosis Date  . Cirrhosis 1992    Cryptogenic  . Esophageal motility disorder 2006    Nonspecific  . GERD (gastroesophageal reflux disease)   . Peripheral neuropathy   . Insomnia   . Dementia   . Hypothyroid   . Anxiety disorder   . Arthritis   . Hypertension   . Depression    Medications:  Reviewed  Assessment: Empiric therapy. Already received 1 gram IV on 04-20-11 at 8pm. Also on Levaquin.  Goal of Therapy:  Vancomycin trough level 15-20 mcg/ml  Plan:  Measure antibiotic drug levels at steady state Follow up culture results Vancomycin 750mg  IV every 24 hours. Will check trough tomorrow before dose F/U SCr tomorrow am  Valrie Hart A 04/23/2011,9:50 AM

## 2011-04-24 LAB — BASIC METABOLIC PANEL
Calcium: 8.5 mg/dL (ref 8.4–10.5)
Creatinine, Ser: 1.03 mg/dL (ref 0.50–1.10)
GFR calc Af Amer: 56 mL/min — ABNORMAL LOW (ref >90)

## 2011-04-24 LAB — TYPE AND SCREEN
ABO/RH(D): O POS
Antibody Screen: NEGATIVE
Unit division: 0

## 2011-04-24 LAB — CBC
Platelets: 91 10*3/uL — ABNORMAL LOW (ref 150–400)
RDW: 13.4 % (ref 11.5–15.5)
WBC: 10.2 10*3/uL (ref 4.0–10.5)

## 2011-04-24 LAB — DIFFERENTIAL
Basophils Absolute: 0 10*3/uL (ref 0.0–0.1)
Basophils Relative: 0 % (ref 0–1)
Eosinophils Relative: 2 % (ref 0–5)
Lymphocytes Relative: 6 % — ABNORMAL LOW (ref 12–46)
Neutro Abs: 8.1 10*3/uL — ABNORMAL HIGH (ref 1.7–7.7)

## 2011-04-24 LAB — GLUCOSE, CAPILLARY
Glucose-Capillary: 150 mg/dL — ABNORMAL HIGH (ref 70–99)
Glucose-Capillary: 215 mg/dL — ABNORMAL HIGH (ref 70–99)
Glucose-Capillary: 151 mg/dL — ABNORMAL HIGH (ref 70–99)

## 2011-04-24 MED ORDER — METOPROLOL SUCCINATE ER 25 MG PO TB24
25.0000 mg | ORAL_TABLET | Freq: Every day | ORAL | Status: DC
  Filled 2011-04-24: qty 1

## 2011-04-24 MED ORDER — LEVOFLOXACIN 500 MG PO TABS
750.0000 mg | ORAL_TABLET | Freq: Every day | ORAL | Status: DC
  Administered 2011-04-24: 750 mg via ORAL
  Filled 2011-04-24: qty 1

## 2011-04-24 MED ORDER — OXYCODONE HCL 5 MG PO TABS
10.0000 mg | ORAL_TABLET | ORAL | Status: DC | PRN
  Administered 2011-04-24 – 2011-04-25 (×3): 10 mg via ORAL
  Administered 2011-04-25: 5 mg via ORAL
  Filled 2011-04-24 (×2): qty 2
  Filled 2011-04-24: qty 1
  Filled 2011-04-24: qty 2

## 2011-04-24 MED ORDER — FUROSEMIDE 20 MG PO TABS
20.0000 mg | ORAL_TABLET | Freq: Every day | ORAL | Status: DC
  Administered 2011-04-25 – 2011-04-26 (×2): 20 mg via ORAL
  Filled 2011-04-24 (×2): qty 1

## 2011-04-24 MED ORDER — HYDROGEN PEROXIDE 3 % EX SOLN
CUTANEOUS | Status: DC | PRN
  Administered 2011-04-23: 1

## 2011-04-24 NOTE — Op Note (Signed)
NAME:  [name], [name]                    ACCOUNT NO.:  MEDICAL RECORD NO.:  [id number]  LOCATION:                                 FACILITY:  PHYSICIAN:  J. Darreld [name], M.D. DATE OF BIRTH:  July 04, 1923  DATE OF PROCEDURE: DATE OF DISCHARGE:                              OPERATIVE REPORT   PREOPERATIVE DIAGNOSIS:  Intertrochanteric fracture of the right hip, comminuted.  POSTOPERATIVE DIAGNOSIS:  Intertrochanteric fracture of the right hip comminuted.  PROCEDURE:  Open treatment internal reduction of the right hip, intertrochanteric fracture using a Richards classic compression screw system with a 90 mm length compression screw, 135 degree short barrel 4 hole side plate with screw holes measuring from 40 mm to 34 mm.  ANESTHESIA:  Spinal.  SURGEON:  J. Darreld Mclean, MD  DRAINS:  One large Hemovac.  FLUID:  No blood replaced.  ESTIMATED BLOOD LOSS:  100 to 150 mL.  INDICATIONS:  The patient is an 77 year old female who is taken care of at home by a full-time sitters at her son's home.  He left the room briefly and she has some how got out of bed, fell and injured her right hip.  She was seen in the hospital this past Friday, today is Wednesday. She was noticed to have intertrochanteric fracture of the hip because of her other significant medical problems including dementia she was admitted to the hospitalist service.  Originally planned to do surgery possibly Saturday but they felt it was best that she be thoroughly evaluated and worked up.  Surgery was then planned for this past Monday afternoon, but she has question of developing a pneumonia or atelectasis in the right lower lung base.  Surgery was postponed until today.  She did have a temperature spike Sunday to 102 but she has been afebrile since then.  Her other vitals remained stable.  The patient has moments of clarity but mainly she is disoriented.  Overall general health is poor.  She is at high risk for the  procedure, but otherwise __________. I have talked to her son on multiple occasions.  He understands she is at a very high risk.  I went over risks and imponderables including infection, blood clots that could lead to death, need for blood transfusion, possible decubitus, she stays in bed all the time, need to try to do physical therapy.  She does understand and bed to chair and chair to bed type therapy and also anesthesia risk. Her son appeared to understand agreed to the procedure as outlined.  He understands the likelihood of success is good, but there is a of course no guarantee. Description of procedure.  The patient was seen in the holding area. The right hip was identified as a correct surgical site.  Son verified this with me, and I placed a mark on the right hip.  She was brought back to the OR, given spinal anesthesia, and then transferred to the Chick fracture table.  AP and lateral views ascertained to be correct with the C-arm fluoroscopy unit.  Prior to using the C-ram had a radiology time-out and everyone had an apron, everyone had appropriate radiologist markers, everyone  knew each other.  Fracture was difficult to fully reduce.  Just some rotation of the femoral head, I was able to get a reduction.  The patient was prepped and draped in the usual manner.  We had a time-out, identified the patient [name]. Everyone in the OR knew each other and doing the right hip for hip fracture.  All instrumentation was properly positioned and working. Lateral incision made through skin subcutaneous tissue, tensor fascia lata, vastus lateralis guide pin was placed, and it looked good in AP and lateral views.  It measured 90 mm and 90 mm step drill was used and 135 degree short barrel, 4 hole side plate was used.  Compression was applied to the system and the first screw was 40 mm.  The remaining screws were measured down to 34 mm.  A total 4 screws were used.  X-rays were taken AP  and lateral views showed good reduction of the fracture. She appeared to be stable.  Hemovac drain was placed, sewn in with 2-0 silk suture.  Vastus lateralis was reapproximated using a running locking #1 Bralon sutures.  Vastus lateralis was repaired this way. Tensor fascia lata was repaired using #1 Bralon figure-of-eight interrupted.  Subcutaneous tissue closed in layers using 2-0 plain and then skin staples were used.  Sterile dressing applied.  The patient tolerated the procedure well and will go to recovery in good condition.          ______________________________ [name], M.D.     JWK/MEDQ  D:  04/23/2011  T:  04/23/2011  Job:  [id number]

## 2011-04-24 NOTE — Progress Notes (Signed)
Physical Therapy Treatment Patient Details Name: [name] MRN: [id number] DOB: 09-16-1922 Today's Date: 04/24/2011  PT Assessment/Plan  PT - Assessment/Plan Comments on Treatment Session: extremely drowsy, probably related to medication PT Plan: Discharge plan remains appropriate PT Frequency: Min 5X/week Follow Up Recommendations: Skilled nursing facility Equipment Recommended: Defer to next venue PT Goals  Acute Rehab PT Goals PT Goal Formulation: Patient unable to participate in goal setting Pt will go Supine/Side to Sit: with max assist Pt will Transfer Sit to Stand/Stand to Sit: with max assist Pt will Transfer Bed to Chair/Chair to Bed: with max assist  PT Treatment Precautions/Restrictions  Precautions Precautions: Fall Required Braces or Orthoses: No Restrictions Weight Bearing Restrictions: Yes RLE Weight Bearing: Touchdown weight bearing Mobility (including Balance) Bed Mobility Bed Mobility: Yes Supine to Sit: 1: +1 Total assist;HOB elevated (Comment degrees) (60 deg) Sitting - Scoot to Edge of Bed: 1: +1 Total assist Sit to Supine - Right: 1: +2 Total assist Sit to Supine - Right Details (indicate cue type and reason): too drowsy to assist in any way Transfers Transfers: Yes Squat Pivot Transfers: 1: +1 Total assist Squat Pivot Transfer Details (indicate cue type and reason): pt unable to assist Ambulation/Gait Ambulation/Gait: No Stairs: No Wheelchair Mobility Wheelchair Mobility: No  Posture/Postural Control Posture/Postural Control: Postural limitations Postural Limitations: severe thoracic kyphosis with apparent scoliosis Balance Balance Assessed: Yes Static Sitting Balance Static Sitting - Level of Assistance: 3: Mod assist Exercise    End of Session PT - End of Session Equipment Utilized During Treatment: Gait belt Activity Tolerance: Treatment limited secondary to medical complications (Comment) (too drowsy to participate in  PT) Patient left: in bed;with call bell in reach;with family/visitor present Nurse Communication: Mobility status for transfers General Behavior During Session: Lethargic Cognition: Impaired, at baseline  Myrlene Broker L 04/24/2011, 1:16 PM

## 2011-04-24 NOTE — Progress Notes (Signed)
ANTIBIOTIC CONSULT NOTE   Pharmacy Consult for Vancomycin Indication: rule out pneumonia  Allergies  Allergen Reactions  . Aspirin     hives  . Codeine     REACTION: UNKNOWN REACTION  . Morphine And Related Other (See Comments)    "completely knocks her out"  . Penicillins   . Sulfonamide Derivatives     REACTION: UNKNOWN REACTION  . Zolpidem Tartrate     REACTION: UNKNOWN REACTION   Patient Measurements: Height: 5\' 4"  (162.6 cm) Weight: 134 lb 14.7 oz (61.2 kg) IBW/kg (Calculated) : 54.7  Adjusted Body Weight: N/A  Vital Signs: Temp: 98.6 F (37 C) (10/18 0630) BP: 118/63 mmHg (10/18 0630) Pulse Rate: 89  (10/18 0630) Intake/Output from previous day: 10/17 0701 - 10/18 0700 In: 1338.8 [P.O.:60; I.V.:1028.8; IV Piggyback:250] Out: 1670 [Urine:1400; Drains:170; Blood:100] Intake/Output from this shift:    Labs:  Basename 04/24/11 0508 04/23/11 0520 04/22/11 1207  WBC 10.2 6.3 7.7  HGB 10.8* 11.0* 11.4*  PLT 91* 76* 73*  LABCREA -- -- --  CREATININE 1.03 -- 1.26*   Estimated Creatinine Clearance: 34.5 ml/min (by C-G formula based on Cr of 1.03).   Microbiology: Recent Results (from the past 720 hour(s))  URINE CULTURE     Status: Normal   Collection Time   04/18/11  2:47 PM      Component Value Range Status Comment   Specimen Description URINE, CATHETERIZED   Final    Special Requests NONE   Final    Setup Time [id number]   Final    Colony Count NO GROWTH   Final    Culture NO GROWTH   Final    Report Status 04/20/2011 FINAL   Final   CULTURE, BLOOD (ROUTINE X 2)     Status: Normal (Preliminary result)   Collection Time   04/20/11 12:36 AM      Component Value Range Status Comment   Specimen Description BLOOD RIGHT ANTECUBITAL   Final    Special Requests BOTTLES DRAWN AEROBIC AND ANAEROBIC 10CC   Final    Culture NO GROWTH 3 DAYS   Final    Report Status PENDING   Incomplete   CULTURE, BLOOD (ROUTINE X 2)     Status: Normal (Preliminary result)   Collection Time   04/20/11 12:53 AM      Component Value Range Status Comment   Specimen Description BLOOD RIGHT HAND   Final    Special Requests BOTTLES DRAWN AEROBIC AND ANAEROBIC 7CC   Final    Culture NO GROWTH 3 DAYS   Final    Report Status PENDING   Incomplete     Medical History: Past Medical History  Diagnosis Date  . Cirrhosis 1992    Cryptogenic  . Esophageal motility disorder 2006    Nonspecific  . GERD (gastroesophageal reflux disease)   . Peripheral neuropathy   . Insomnia   . Dementia   . Hypothyroid   . Anxiety disorder   . Arthritis   . Hypertension   . Depression    Medications:  Reviewed  Assessment: SCr has improved  Goal of Therapy:  Vancomycin trough level 15-20 mcg/ml  Plan: Trough level tonight before dose Adjust dosing as needed  Margo Aye, Domenico Achord A 04/24/2011,8:33 AM

## 2011-04-24 NOTE — Addendum Note (Signed)
Addendum  created 04/24/11 1417 by Minerva Areola, CRNA   Modules edited:Notes Section

## 2011-04-24 NOTE — Progress Notes (Signed)
Physical Therapy Evaluation Patient Details Name: [name] MRN: [id number] DOB: [birth year] Today's Date: 04/24/2011  Problem List:  Patient Active Problem List  Diagnoses  . HYPOTHYROIDISM  . DEMENTIA  . ANXIETY  . PERIPHERAL NEUROPATHY  . GERD  . GASTROPARESIS  . CIRRHOSIS  . INSOMNIA  . ANOREXIA  . NAUSEA  . Hip fracture, right  . Fever of undetermined origin  . Pneumonia due to infectious agent    Past Medical History:  Past Medical History  Diagnosis Date  . Cirrhosis 1992    Cryptogenic  . Esophageal motility disorder 2006    Nonspecific  . GERD (gastroesophageal reflux disease)   . Peripheral neuropathy   . Insomnia   . Dementia   . Hypothyroid   . Anxiety disorder   . Arthritis   . Hypertension   . Depression    Past Surgical History:  Past Surgical History  Procedure Date  . Appendectomy   . Vesicovaginal fistula closure w/ tah   . Eye surgery   . Abdominal hysterectomy     PT Assessment/Plan/Recommendation PT Assessment Clinical Impression Statement: pt lethargic but cooperative, apparently minimally ambulatory PTA..now follows very simple directions with transfer, but unable to assist with  exercise or transfer...will need SNF at d/c. PT Recommendation/Assessment: Patient will need skilled PT in the acute care venue PT Problem List: Decreased strength;Decreased range of motion;Decreased activity tolerance;Decreased balance;Decreased mobility;Decreased cognition;Decreased knowledge of use of DME;Decreased safety awareness;Decreased knowledge of precautions Problem List Comments: pt does have beginning of skin breakdown on back and buttocks...RN alerted Barriers to Discharge: None PT Therapy Diagnosis : Difficulty walking;Generalized weakness;Acute pain PT Plan PT Frequency: Min 5X/week PT Treatment/Interventions: DME instruction;Functional mobility training;Therapeutic exercise;Balance training;Patient/family education PT  Recommendation Recommendations for Other Services: OT consult Follow Up Recommendations: Skilled nursing facility Equipment Recommended: Defer to next venue PT Goals  Acute Rehab PT Goals PT Goal Formulation: Patient unable to participate in goal setting Pt will go Supine/Side to Sit: with max assist Pt will Transfer Sit to Stand/Stand to Sit: with max assist Pt will Transfer Bed to Chair/Chair to Bed: with max assist  PT Evaluation Precautions/Restrictions  Precautions Precautions: Fall Required Braces or Orthoses: No Restrictions Weight Bearing Restrictions: Yes RLE Weight Bearing: Touchdown weight bearing Prior Functioning  Home Living Lives With: Family Receives Help From: Personal care attendant Type of Home: House Additional Comments: home settin is unknown-pt unable to describe-does have full time assist per MSW Prior Function Level of Independence: Needs assistance with ADLs;Needs assistance with tranfers;Needs assistance with gait;Needs assistance with homemaking Driving: No Vocation: Retired Financial risk analyst Arousal/Alertness: Lethargic Overall Cognitive Status: History of cognitive impairments History of Cognitive Impairment: Appears at baseline functioning Orientation Level: Oriented to person;Oriented to place;Oriented to situation Sensation/Coordination Sensation Light Touch: Appears Intact Extremity Assessment RUE Assessment RUE Assessment: Within Functional Limits LUE Assessment LUE Assessment: Within Functional Limits RLE Assessment RLE Assessment: Exceptions to [facility] RLE PROM (degrees) Overall PROM Right Lower Extremity: Deficits;Due to pain LLE Assessment LLE Assessment: Within Functional Limits Mobility (including Balance) Bed Mobility Bed Mobility: Yes Supine to Sit: 1: +1 Total assist;HOB elevated (Comment degrees) (60 deg) Sitting - Scoot to Edge of Bed: 1: +1 Total assist Transfers Transfers: Yes Squat Pivot Transfers: 1: +1 Total  assist;Without upper extremity assistance Squat Pivot Transfer Details (indicate cue type and reason): pt unable to assist in any way Ambulation/Gait Ambulation/Gait: No Stairs: No Wheelchair Mobility Wheelchair Mobility: No  Posture/Postural Control Posture/Postural Control: Postural limitations Postural Limitations: severe thoracic  kyphosis with apparent scoliosis Balance Balance Assessed: Yes Static Sitting Balance Static Sitting - Level of Assistance: 3: Mod assist Exercise    End of Session PT - End of Session Equipment Utilized During Treatment: Gait belt Activity Tolerance: Patient tolerated treatment well Patient left: in chair;with call bell in reach;with family/visitor present Nurse Communication: Mobility status for transfers General Behavior During Session: [facility] for tasks performed Cognition: Impaired, at baseline  Myrlene Broker L 04/24/2011, 10:34 AM

## 2011-04-24 NOTE — Anesthesia Postprocedure Evaluation (Signed)
Anesthesia Post Note  Patient: [name]  Procedure(s) Performed:  OPEN REDUCTION INTERNAL FIXATION HIP  Anesthesia type: Spinal  Patient location: 341  Post pain: Pain level controlled  Post assessment: Post-op Vital signs reviewed, Patient's Cardiovascular Status Stable, Respiratory Function Stable, Patent Airway, No signs of Nausea or vomiting and Pain level controlled  Last Vitals:  Filed Vitals:   04/24/11 1300  BP: 104/58  Pulse: 100  Temp: 99 F (37.2 C)  Resp: 18    Post vital signs: Reviewed and stable  Level of consciousness: awake and alert   Complications: No apparent anesthesia complications

## 2011-04-24 NOTE — Progress Notes (Signed)
Tmax was 99.5.  She is afebrile.    NV is intact.  Vital signs are stable.  HGB is 10.9.  Labs are normal.  Pain is controlled.  She will begin physical therapy today.  I will be out of town until April 30, 2011.  Dr. Romeo Apple will cover.  The family is also aware and agreeable.

## 2011-04-24 NOTE — Progress Notes (Signed)
Subjective:  Patient seen and examined today.  Patient complained that pain is not well controlled with oxycodone 5 mg. She does not take taking the Dilaudid works. She is allergic to morphine.  Objective: Vital signs in last 24 hours: Temp:  [97.5 F (36.4 C)-99.5 F (37.5 C)] 98.6 F (37 C) (10/18 0630) Pulse Rate:  [74-89] 89  (10/18 0630) Resp:  [14-24] 20  (10/18 0630) BP: (76-118)/(29-63) 118/63 mmHg (10/18 0630) SpO2:  [91 %-100 %] 91 % (10/18 0900) Weight change:  Last BM Date: 04/20/11  Intake/Output from previous day: 10/17 0701 - 10/18 0700 In: 1338.8 [P.O.:60; I.V.:1028.8; IV Piggyback:250] Out: 1670 [Urine:1400; Drains:170; Blood:100]     Physical Exam: General: Alert, sleepy, in no acute distress, ambulating in room. HEENT: No bruits, no goiter. Heart: Regular rate and rhythm, without murmurs, rubs, gallops. Lungs: Clear to auscultation bilaterally. Abdomen: Soft, nontender, nondistended, positive bowel sounds. Extremities: No clubbing cyanosis or edema. Neuro: Grossly intact, nonfocal.    Lab Results: Basic Metabolic Panel:  Basename 04/24/11 0508 04/22/11 1207  NA 139 138  K 4.4 4.3  CL 105 104  CO2 26 28  GLUCOSE 146* 116*  BUN 22 29*  CREATININE 1.03 1.26*  CALCIUM 8.5 8.7  MG -- --  PHOS -- --   Liver Function Tests: No results found for this basename: AST:2,ALT:2,ALKPHOS:2,BILITOT:2,PROT:2,ALBUMIN:2 in the last 72 hours No results found for this basename: LIPASE:2,AMYLASE:2 in the last 72 hours No results found for this basename: AMMONIA:2 in the last 72 hours CBC:  Basename 04/24/11 0508 04/23/11 0520  WBC 10.2 6.3  NEUTROABS 8.1* 4.6  HGB 10.8* 11.0*  HCT 31.7* 33.2*  MCV 91.6 92.7  PLT 91* 76*   Cardiac Enzymes: No results found for this basename: CKTOTAL:3,CKMB:3,CKMBINDEX:3,TROPONINI:3 in the last 72 hours BNP: No results found for this basename: POCBNP:3 in the last 72 hours D-Dimer: No results found for this basename:  DDIMER:2 in the last 72 hours CBG:  Basename 04/24/11 0749 04/23/11 2111 04/23/11 1627 04/23/11 0749 04/22/11 2122 04/22/11 1229  GLUCAP 150* 174* 135* 102* 158* 126*   Hemoglobin A1C: No results found for this basename: HGBA1C in the last 72 hours Fasting Lipid Panel: No results found for this basename: CHOL,HDL,LDLCALC,TRIG,CHOLHDL,LDLDIRECT in the last 72 hours Thyroid Function Tests: No results found for this basename: TSH,T4TOTAL,FREET4,T3FREE,THYROIDAB in the last 72 hours  Recent Results (from the past 240 hour(s))  URINE CULTURE     Status: Normal   Collection Time   04/18/11  2:47 PM      Component Value Range Status Comment   Specimen Description URINE, CATHETERIZED   Final    Special Requests NONE   Final    Setup Time [id number]   Final    Colony Count NO GROWTH   Final    Culture NO GROWTH   Final    Report Status 04/20/2011 FINAL   Final   CULTURE, BLOOD (ROUTINE X 2)     Status: Normal (Preliminary result)   Collection Time   04/20/11 12:36 AM      Component Value Range Status Comment   Specimen Description BLOOD RIGHT ANTECUBITAL   Final    Special Requests BOTTLES DRAWN AEROBIC AND ANAEROBIC 10CC   Final    Culture NO GROWTH 3 DAYS   Final    Report Status PENDING   Incomplete   CULTURE, BLOOD (ROUTINE X 2)     Status: Normal (Preliminary result)   Collection Time   04/20/11 12:53 AM  Component Value Range Status Comment   Specimen Description BLOOD RIGHT HAND   Final    Special Requests BOTTLES DRAWN AEROBIC AND ANAEROBIC 7CC   Final    Culture NO GROWTH 3 DAYS   Final    Report Status PENDING   Incomplete     Studies/Results: Dg Cervical Spine 2-3 Views  04/22/2011  *RADIOLOGY REPORT*  Clinical Data: Back pain, fell at home, femoral fracture  CERVICAL SPINE - 2-3 VIEW  Comparison: Portable AP and lateral views the cervical spine obtained.  Findings: Examination to by portable technique and lack of oblique views. Bones are diffusely demineralized.  Scattered facet degenerative changes. Vertebral body heights maintained. Scattered disc space narrowing minimal endplate spurring. No gross evidence of cervical spine fracture or identified on limited assessment. Marginal retrolisthesis of C4 on C5, likely degenerative. Prevertebral soft tissues normal thickness. Atherosclerotic calcification aorta. Odontoid process appears intact.  IMPRESSION: Osseous demineralization with scattered facet degenerative changes and minimal degenerative disc disease changes. No definite acute cervical spine abnormalities identified on portable limited exam.  Original Report Authenticated By: Lollie Marrow, M.D.   Dg Hip Operative Right  04/23/2011  *RADIOLOGY REPORT*  Clinical Data: Hip fracture.  OPERATIVE RIGHT HIP  Comparison: Plain films 04/18/2011.  Findings: We are provided with three fluoroscopic intraoperative spot views of the right hip.  Images demonstrate placement of a dynamic hip screw for fixation of an intertrochanteric fracture. Position and alignment appear near anatomic.  IMPRESSION: ORIF right intertrochanteric fracture.  Original Report Authenticated By: Bernadene Bell. Maricela Curet, M.D.    Medications: Scheduled Meds:    . acetaminophen  650 mg Rectal Once  . albuterol  2.5 mg Nebulization Q6H  . bupivacaine 0.75% in dextrose 8.25% (intrathecal)      . ceFAZolin (ANCEF) IV  1 g Intravenous Once  . ePHEDrine      . fentaNYL  50 mcg Intravenous Once  . fentaNYL      . furosemide  20 mg Oral Daily  . insulin aspart  0-5 Units Subcutaneous QHS  . insulin aspart  0-9 Units Subcutaneous TID WC  . lactulose  10 g Oral TID  . levofloxacin (LEVAQUIN) IV  500 mg Intravenous Q24H  . levothyroxine  75 mcg Oral Daily  . lidocaine      . metoprolol  25 mg Oral Daily  . ondansetron  4 mg Intravenous Once  . pantoprazole  40 mg Oral Q1200  . phenylephrine      . polyethylene glycol  17 g Oral Daily  . potassium chloride  10 mEq Oral BID  . pregabalin  100  mg Oral TID  . propofol      . rifaximin  550 mg Oral BID  . rivaroxaban  10 mg Oral Daily  . sodium chloride  3 mL Intravenous Q12H  . vancomycin  750 mg Intravenous Q24H  . zolpidem  5 mg Oral QHS  . DISCONTD: furosemide  20 mg Oral Daily  . DISCONTD: metoprolol  25 mg Oral Daily   Continuous Infusions:    . DISCONTD: lactated ringers 1,000 application (04/23/11 1526)   PRN Meds:.acetaminophen, HYDROmorphone, magnesium hydroxide, oxyCODONE, promethazine, sodium chloride, DISCONTD: acetaminophen, DISCONTD: acetaminophen, DISCONTD: fentaNYL, DISCONTD: fentaNYL, DISCONTD: midazolam, DISCONTD: ondansetron (ZOFRAN) IV, DISCONTD: oxyCODONE, DISCONTD: sodium chloride  Assessment/Plan: Hip fracture, right:  Surgery done on 10/17 Wednesday.  Patient doing well postop. Pneumonia: Fever now resolved. On Vanc and levaquin, will continue IV antibiotics. Altered mental status:  Resolved.  Cirrhosis: Patient has  h/o liver cirrhosis and is on lactulose, rifaximin.  Chronic pain: Patient takes Oxycodone at home, She takes OxyContin 20 mg every 4 hours. Will increase her oxycodone to 10-15 mg every 4 when necessary. Hypothyroidism: continue synthroid.  Periphral neuropathy: Continue pregabalin.  Diabetes mellitus:  Continue SSI Hypertension: Blood pressure was running low today. Most likely due to narcotics. Will place parameters for her antihypertensive medications.    LOS: 6 days   Melissa Plater MD, [name] 04/24/2011, 11:17 AM

## 2011-04-24 NOTE — Consult Note (Signed)
CSW spoke with pt's son regarding d/c plan and bed offers.  PT recommending SNF which son is agreeable to.  CSW shared bed offers with son who requests search to be extended to [facility] as their first choice is not available.  He plans to discuss with family today.  CSW to follow up.  [name]

## 2011-04-25 ENCOUNTER — Inpatient Hospital Stay (HOSPITAL_COMMUNITY): Payer: PRIVATE HEALTH INSURANCE

## 2011-04-25 LAB — BASIC METABOLIC PANEL
BUN: 21 mg/dL (ref 6–23)
Calcium: 8.2 mg/dL — ABNORMAL LOW (ref 8.4–10.5)
Chloride: 102 mEq/L (ref 96–112)
GFR calc non Af Amer: 43 mL/min — ABNORMAL LOW (ref >90)
Potassium: 4.9 mEq/L (ref 3.5–5.1)
Sodium: 135 mEq/L (ref 135–145)

## 2011-04-25 LAB — CBC
MCH: 30.7 pg (ref 26.0–34.0)
MCV: 93.1 fL (ref 78.0–100.0)
Platelets: 80 10*3/uL — ABNORMAL LOW (ref 150–400)
RDW: 13.7 % (ref 11.5–15.5)
WBC: 8.4 10*3/uL (ref 4.0–10.5)

## 2011-04-25 LAB — DIFFERENTIAL
Basophils Absolute: 0 10*3/uL (ref 0.0–0.1)
Eosinophils Absolute: 0.2 10*3/uL (ref 0.0–0.7)
Eosinophils Relative: 2 % (ref 0–5)

## 2011-04-25 MED ORDER — OXYCODONE HCL 5 MG PO TABS
5.0000 mg | ORAL_TABLET | ORAL | Status: DC | PRN
  Administered 2011-04-25 – 2011-04-28 (×14): 5 mg via ORAL
  Filled 2011-04-25 (×14): qty 1

## 2011-04-25 MED ORDER — ONDANSETRON HCL 4 MG/2ML IJ SOLN: 4.0000 mg | Freq: Four times a day (QID) | INTRAMUSCULAR | Status: DC | PRN

## 2011-04-25 MED ORDER — ALBUTEROL SULFATE (5 MG/ML) 0.5% IN NEBU
2.5000 mg | INHALATION_SOLUTION | Freq: Four times a day (QID) | RESPIRATORY_TRACT | Status: AC
  Administered 2011-04-25 – 2011-04-26 (×3): 2.5 mg via RESPIRATORY_TRACT
  Filled 2011-04-25 (×3): qty 0.5

## 2011-04-25 MED ORDER — LEVOFLOXACIN 500 MG PO TABS
500.0000 mg | ORAL_TABLET | Freq: Every day | ORAL | Status: DC
  Administered 2011-04-25 – 2011-04-27 (×3): 500 mg via ORAL
  Filled 2011-04-25 (×3): qty 1

## 2011-04-25 MED ORDER — METOPROLOL SUCCINATE ER 25 MG PO TB24
12.5000 mg | ORAL_TABLET | Freq: Every day | ORAL | Status: DC
  Administered 2011-04-25: 25 mg via ORAL
  Administered 2011-04-26: 12.5 mg via ORAL
  Filled 2011-04-25: qty 2

## 2011-04-25 NOTE — Progress Notes (Signed)
Had a discussion about pts care with her son.  We discussed her home pain med vs what she ws on here.  I voiced to him that the pt had been lethargic through out the day and according to the Drs note he was unable to get her awake well during his visit and the therapist stated that she was unable to give good care with the visit today.  So the pain med was decreased.  I voiced to him that I had only given her 5mg  and she still had been lethargic.  Son had concerns of pt being in pain because she takes a higher dose at home.  I told her we would start slow and if needed we could call the and get the med increased. He verbalized understanding and voiced no further complaints or concerns at this time.

## 2011-04-25 NOTE — Consult Note (Signed)
CSW spoke with pt's son following discussion with family. CSW presented [facility] bed offers at request of family and they choose [facility].  Facility notified.  Awaiting stability for d/c to SNF.   [name]

## 2011-04-25 NOTE — Progress Notes (Signed)
Physical Therapy Treatment Patient Details Name: [name] MRN: [id number] DOB: 27-Jul-1922 Today's Date: 04/25/2011  PT Assessment/Plan  PT - Assessment/Plan Comments on Treatment Session: PT progress continues to be limited by drowsiness, lethargy.  Will probably not be appropriate for PT on the weekend PT Plan: Discharge plan remains appropriate PT Goals  Acute Rehab PT Goals PT Goal: Supine/Side to Sit - Progress: Progressing toward goal PT Transfer Goal: Bed to Chair/Chair to Bed - Progress: Progressing toward goal  PT Treatment Precautions/Restrictions  Precautions Precautions: Fall Required Braces or Orthoses: No Restrictions Weight Bearing Restrictions: Yes RLE Weight Bearing: Touchdown weight bearing Mobility (including Balance) Bed Mobility Supine to Sit: 1: +1 Total assist Supine to Sit Details (indicate cue type and reason): continues with significant drowsiness but cooperative Sitting - Scoot to Edge of Bed: 1: +1 Total assist Transfers Transfers: Yes Squat Pivot Transfers: 1: +1 Total assist Squat Pivot Transfer Details (indicate cue type and reason): pt still unable to assist Ambulation/Gait Ambulation/Gait: No Stairs: No Wheelchair Mobility Wheelchair Mobility: No    Exercise  General Exercises - Lower Extremity Ankle Circles/Pumps: PROM;Both;10 reps;Supine Short Arc Quad: PROM;Both;10 reps;Supine Heel Slides: PROM;Both;10 reps;Supine Hip ABduction/ADduction: PROM;Both;10 reps;Supine End of Session PT - End of Session Equipment Utilized During Treatment: Gait belt Activity Tolerance: Patient tolerated treatment well;Patient limited by fatigue Patient left: in chair;with call bell in reach General Behavior During Session: Lethargic Cognition: Impaired, at baseline  Konrad Penta 04/25/2011, 9:23 AM

## 2011-04-25 NOTE — Progress Notes (Signed)
Subjective: Ms. Ek is quite lethargic this morning.  She is unable to remain awake during my exam.  She can not provide a reliable history due to her lethargy.  PT has noted that her lethargy is also interfering with her therapy.  There is no family in the room at the time of my exam.    Objective: Vital signs in last 24 hours: Temp:  [99 F (37.2 C)-99.3 F (37.4 C)] 99.2 F (37.3 C) (10/19 0510) Pulse Rate:  [94-104] 94  (10/19 0808) Resp:  [18] 18  (10/19 0808) BP: (95-104)/(51-58) 95/55 mmHg (10/19 0510) SpO2:  [94 %-97 %] 97 % (10/19 0808) Weight change:  Last BM Date: 04/20/11  Intake/Output from previous day: 10/18 0701 - 10/19 0700 In: 240 [P.O.:240] Out: 470 [Urine:350; Drains:120]     Physical Exam: General: no acute resp distress, but very lethargic - pupils are pinpoint  Heart: Regular rate and rhythm, without murmurs, rubs, gallops Lungs: Clear to auscultation bilaterally Abdomen: Soft, nontender, nondistended, positive bowel sounds but hypoactive  Extremities: No clubbing or cyanosis - trace B LE edema  Neuro: lethargic, unable to follow directed commands     Lab Results: Basic Metabolic Panel:  Basename 04/25/11 0454 04/24/11 0508  NA 135 139  K 4.9 4.4  CL 102 105  CO2 26 26  GLUCOSE 122* 146*  BUN 21 22  CREATININE 1.14* 1.03  CALCIUM 8.2* 8.5  MG -- --  PHOS -- --   Liver Function Tests: No results found for this basename: AST:2,ALT:2,ALKPHOS:2,BILITOT:2,PROT:2,ALBUMIN:2 in the last 72 hours No results found for this basename: LIPASE:2,AMYLASE:2 in the last 72 hours No results found for this basename: AMMONIA:2 in the last 72 hours CBC:  Basename 04/25/11 0454 04/24/11 0508  WBC 8.4 10.2  NEUTROABS 6.5 8.1*  HGB 9.4* 10.8*  HCT 28.5* 31.7*  MCV 93.1 91.6  PLT 80* 91*   Cardiac Enzymes: No results found for this basename: CKTOTAL:3,CKMB:3,CKMBINDEX:3,TROPONINI:3 in the last 72 hours BNP: No results found for this basename:  POCBNP:3 in the last 72 hours D-Dimer: No results found for this basename: DDIMER:2 in the last 72 hours CBG:  Basename 04/25/11 0732 04/24/11 2059 04/24/11 1646 04/24/11 1136 04/24/11 0749 04/23/11 2111  GLUCAP 122* 151* 150* 215* 150* 174*   Hemoglobin A1C: No results found for this basename: HGBA1C in the last 72 hours Fasting Lipid Panel: No results found for this basename: CHOL,HDL,LDLCALC,TRIG,CHOLHDL,LDLDIRECT in the last 72 hours Thyroid Function Tests: No results found for this basename: TSH,T4TOTAL,FREET4,T3FREE,THYROIDAB in the last 72 hours  Recent Results (from the past 240 hour(s))  URINE CULTURE     Status: Normal   Collection Time   04/18/11  2:47 PM      Component Value Range Status Comment   Specimen Description URINE, CATHETERIZED   Final    Special Requests NONE   Final    Setup Time [id number]   Final    Colony Count NO GROWTH   Final    Culture NO GROWTH   Final    Report Status 04/20/2011 FINAL   Final   CULTURE, BLOOD (ROUTINE X 2)     Status: Normal (Preliminary result)   Collection Time   04/20/11 12:36 AM      Component Value Range Status Comment   Specimen Description BLOOD RIGHT ANTECUBITAL   Final    Special Requests BOTTLES DRAWN AEROBIC AND ANAEROBIC 10CC   Final    Culture NO GROWTH 4 DAYS   Final  Report Status PENDING   Incomplete   CULTURE, BLOOD (ROUTINE X 2)     Status: Normal (Preliminary result)   Collection Time   04/20/11 12:53 AM      Component Value Range Status Comment   Specimen Description BLOOD RIGHT HAND   Final    Special Requests BOTTLES DRAWN AEROBIC AND ANAEROBIC 7CC   Final    Culture NO GROWTH 4 DAYS   Final    Report Status PENDING   Incomplete     Studies/Results: Dg Hip Operative Right  04/23/2011  *RADIOLOGY REPORT*  Clinical Data: Hip fracture.  OPERATIVE RIGHT HIP  Comparison: Plain films 04/18/2011.  Findings: We are provided with three fluoroscopic intraoperative spot views of the right hip.  Images  demonstrate placement of a dynamic hip screw for fixation of an intertrochanteric fracture. Position and alignment appear near anatomic.  IMPRESSION: ORIF right intertrochanteric fracture.  Original Report Authenticated By: Bernadene Bell. Maricela Curet, M.D.    Medications: Scheduled Meds:    . acetaminophen  650 mg Rectal Once  . albuterol  2.5 mg Nebulization Q6H  . ceFAZolin (ANCEF) IV  1 g Intravenous Once  . fentaNYL  50 mcg Intravenous Once  . furosemide  20 mg Oral Daily  . insulin aspart  0-5 Units Subcutaneous QHS  . insulin aspart  0-9 Units Subcutaneous TID WC  . lactulose  10 g Oral TID  . levofloxacin  750 mg Oral q1800  . levothyroxine  75 mcg Oral Daily  . metoprolol  25 mg Oral Daily  . ondansetron  4 mg Intravenous Once  . pantoprazole  40 mg Oral Q1200  . polyethylene glycol  17 g Oral Daily  . potassium chloride  10 mEq Oral BID  . pregabalin  100 mg Oral TID  . rifaximin  550 mg Oral BID  . rivaroxaban  10 mg Oral Daily  . sodium chloride  3 mL Intravenous Q12H  . zolpidem  5 mg Oral QHS  . DISCONTD: furosemide  20 mg Oral Daily  . DISCONTD: levofloxacin (LEVAQUIN) IV  500 mg Intravenous Q24H  . DISCONTD: metoprolol  25 mg Oral Daily  . DISCONTD: vancomycin  750 mg Intravenous Q24H   Continuous Infusions:   PRN Meds:.acetaminophen, hydrogen peroxide, magnesium hydroxide, oxyCODONE, promethazine, sodium chloride, DISCONTD: HYDROmorphone, DISCONTD: oxyCODONE  Assessment/Plan:  Hip fracture, right:  Surgery done on 10/17 (Wednesday).  Rehab is being delayed by lethargy.  No apparent acute complications otherwise.  Ortho is following.  L basilar Pneumonia: Fever now resolved. On Vanc and levaquin, will continue Levaquin but d/c vanc in setting of renal insuff and probably community acquired organisms with very low prob of MRSA pneumonia. F/U CXR in AM.  Altered mental status/somnolence/lethargy:  This has proven to be a significant road block in her rehab process.   Minimize sedating meds.  Recheck UA.  F/U CXR in AM.  Check ammonia level.    Cryptogenic Cirrhosis: Patient has h/o liver cirrhosis and is on lactulose, rifaximin. Will check ammonia level.    Chronic pain: Patient takes Oxycodone at home, She takes OxyContin 20 mg every 4 hours. Will have to minimize narcotics as all signs suggest that she is oversedated with pain meds at this time (lethargic, constricted pupils, low BP).  Hypothyroidism: continue synthroid  Periphral neuropathy: continue pregabalin  Diabetes mellitus:  Continue SSI  Normocytic anemia: likely due to surgical blood loss + chronic component related to cirrhosis  Mild/Mod hypotension: in setting of hx of HTN -  likely due to narcotics - decrease narcotics as noted above and follow vitals  Dementia: unclear to me what her baseline MS is - will cont to follow w/ decrease in narcotic dosing   GERD w/ esophageal motility disorder: no apparent acute issues  Thrombocytopenia, secondary to cirrhosis: stable at present - no evidence of bleeding complications      LOS: 7 days   MCCLUNG,JEFFREY T 04/25/2011, 9:27 AM

## 2011-04-25 NOTE — Progress Notes (Signed)
Physical Therapy Treatment Patient Details Name: [name] MRN: [id number] DOB: [birth year] Today's Date: 04/25/2011  PT Assessment/Plan  PT - Assessment/Plan Comments on Treatment Session: no progress made due to lethargy PT Plan: Discharge plan remains appropriate PT Goals  Acute Rehab PT Goals PT Goal: Supine/Side to Sit - Progress: Progressing toward goal PT Transfer Goal: Bed to Chair/Chair to Bed - Progress: Progressing toward goal  PT Treatment Precautions/Restrictions  Precautions Precautions: Fall Required Braces or Orthoses: No Restrictions Weight Bearing Restrictions: Yes RLE Weight Bearing: Touchdown weight bearing Mobility (including Balance) Bed Mobility Supine to Sit: 1: +1 Total assist Supine to Sit Details (indicate cue type and reason): continues with significant drowsiness but cooperative Sitting - Scoot to Edge of Bed: 1: +1 Total assist Sit to Supine - Right: 1: +2 Total assist Sit to Supine - Right Details (indicate cue type and reason): very fatigued, unable to assist Transfers Transfers: Yes Squat Pivot Transfers: 1: +1 Total assist Squat Pivot Transfer Details (indicate cue type and reason): unable to assist Ambulation/Gait Ambulation/Gait: No Stairs: No Wheelchair Mobility Wheelchair Mobility: No    Exercise  General Exercises - Lower Extremity Ankle Circles/Pumps: PROM;Both;10 reps;Supine Short Arc Quad: PROM;Both;10 reps;Supine Heel Slides: PROM;Both;10 reps;Supine Hip ABduction/ADduction: PROM;Both;10 reps;Supine End of Session PT - End of Session Equipment Utilized During Treatment: Gait belt Activity Tolerance: Patient limited by fatigue Patient left: in bed;with call bell in reach Nurse Communication: Mobility status for transfers General Behavior During Session: Lethargic Cognition: Impaired, at baseline  Konrad Penta 04/25/2011, 11:20 AM

## 2011-04-25 NOTE — Progress Notes (Signed)
Estimated Creatinine Clearance: 31.2 ml/min (by C-G formula based on Cr of 1.14).  Will reduce Levaquin dose to 500mg  daily due to renal function.  [name] 04/25/2011 1:10 PM

## 2011-04-26 ENCOUNTER — Inpatient Hospital Stay (HOSPITAL_COMMUNITY): Payer: PRIVATE HEALTH INSURANCE

## 2011-04-26 LAB — CULTURE, BLOOD (ROUTINE X 2)

## 2011-04-26 LAB — BASIC METABOLIC PANEL
BUN: 23 mg/dL (ref 6–23)
CO2: 26 mEq/L (ref 19–32)
Calcium: 8.2 mg/dL — ABNORMAL LOW (ref 8.4–10.5)
Creatinine, Ser: 1.16 mg/dL — ABNORMAL HIGH (ref 0.50–1.10)
Glucose, Bld: 138 mg/dL — ABNORMAL HIGH (ref 70–99)
Sodium: 136 mEq/L (ref 135–145)

## 2011-04-26 LAB — GLUCOSE, CAPILLARY
Glucose-Capillary: 123 mg/dL — ABNORMAL HIGH (ref 70–99)
Glucose-Capillary: 128 mg/dL — ABNORMAL HIGH (ref 70–99)

## 2011-04-26 LAB — CBC
HCT: 28.1 % — ABNORMAL LOW (ref 36.0–46.0)
Hemoglobin: 9.3 g/dL — ABNORMAL LOW (ref 12.0–15.0)
MCH: 30.9 pg (ref 26.0–34.0)
MCV: 93.4 fL (ref 78.0–100.0)
RBC: 3.01 MIL/uL — ABNORMAL LOW (ref 3.87–5.11)

## 2011-04-26 LAB — AMMONIA: Ammonia: 46 umol/L (ref 11–60)

## 2011-04-26 MED ORDER — TRAMADOL HCL 50 MG PO TABS
50.0000 mg | ORAL_TABLET | Freq: Four times a day (QID) | ORAL | Status: DC | PRN
  Administered 2011-04-26 – 2011-04-27 (×2): 50 mg via ORAL
  Filled 2011-04-26 (×2): qty 1

## 2011-04-26 MED ORDER — SODIUM CHLORIDE 0.9 % IJ SOLN
INTRAMUSCULAR | Status: AC
  Filled 2011-04-26: qty 10

## 2011-04-26 MED ORDER — SODIUM CHLORIDE 0.9 % IV SOLN: INTRAVENOUS | Status: DC

## 2011-04-26 MED ORDER — DONEPEZIL HCL 5 MG PO TABS
10.0000 mg | ORAL_TABLET | Freq: Every day | ORAL | Status: DC
  Administered 2011-04-26 – 2011-04-27 (×2): 10 mg via ORAL
  Filled 2011-04-26 (×2): qty 2

## 2011-04-26 MED ORDER — SODIUM CHLORIDE 0.9 % IV SOLN
INTRAVENOUS | Status: DC
  Administered 2011-04-26: 1000 mL via INTRAVENOUS
  Administered 2011-04-27 (×2): via INTRAVENOUS

## 2011-04-26 NOTE — Progress Notes (Signed)
Subjective: 3 Days Post-Op Procedure(s) (LRB): OPEN REDUCTION INTERNAL FIXATION HIP (Right)     Objective: Vital signs in last 24 hours: Temp:  [98.1 F (36.7 C)-99.3 F (37.4 C)] 98.1 F (36.7 C) (10/20 0510) Pulse Rate:  [73-91] 91  (10/20 0926) Resp:  [16-18] 17  (10/20 0833) BP: (85-91)/(49-52) 91/52 mmHg (10/20 0926) SpO2:  [96 %-97 %] 97 % (10/20 0510)  Intake/Output from previous day: 10/19 0701 - 10/20 0700 In: 160 [P.O.:160] Out: 35 [Drains:35] Intake/Output this shift: Total I/O In: 80 [P.O.:80] Out: -    Basename 04/26/11 0435 04/25/11 0454 04/24/11 0508  HGB 9.3* 9.4* 10.8*    Basename 04/26/11 0435 04/25/11 0454  WBC 7.4 8.4  RBC 3.01* 3.06*  HCT 28.1* 28.5*  PLT 89* 80*    Basename 04/26/11 0435 04/25/11 0454  NA 136 135  K 4.7 4.9  CL 103 102  CO2 26 26  BUN 23 21  CREATININE 1.16* 1.14*  GLUCOSE 138* 122*  CALCIUM 8.2* 8.2*   No results found for this basename: LABPT:2,INR:2 in the last 72 hours  Incision: scant drainage Drain out   Assessment/Plan: 3 Days Post-Op Procedure(s) (LRB): OPEN REDUCTION INTERNAL FIXATION HIP (Right) Up with therapy  Fuller Canada 04/26/2011, 2:37 PM

## 2011-04-26 NOTE — Progress Notes (Signed)
Patient has been requesting pain medications all night and after checking BP @ 0600, it was 85/50. Called MD Orvan Falconer to make aware and he said to go ahead and give. BP level range from 95-118 SBP and 49-58 DBP. And patient doesn't have IV access.

## 2011-04-26 NOTE — Progress Notes (Signed)
Subjective: The patient is much more alert today. Unfortunately she is also complaining of severe uncontrolled pain in her right hip. She has no other complaints at present and specifically denies chest pain fevers chills nausea vomiting abdominal pain or headache. Her blood pressure has remained somewhat soft.  Objective: Vital signs in last 24 hours: Temp:  [98.1 F (36.7 C)-99.3 F (37.4 C)] 98.1 F (36.7 C) (10/20 0510) Pulse Rate:  [73-92] 91  (10/20 0926) Resp:  [16-18] 17  (10/20 0833) BP: (85-91)/(49-52) 91/52 mmHg (10/20 0926) SpO2:  [96 %-97 %] 97 % (10/20 0510) Weight change:  Last BM Date: 04/25/11  Intake/Output from previous day: 10/19 0701 - 10/20 0700 In: 160 [P.O.:160] Out: 35 [Drains:35] Total I/O In: 80 [P.O.:80] Out: -    Physical Exam: General: no acute resp distress, last lethargic - pupils are much less constricted and now more reactive Heart: Regular rate and rhythm, without murmurs, rubs, gallops Lungs: Clear to auscultation bilaterally Abdomen: Soft, nontender, nondistended, positive bowel sounds  Extremities: No clubbing or cyanosis - trace B LE edema  Neuro: Much more alert and oriented x4, nonfocal exam     Lab Results: Basic Metabolic Panel:  Basename 04/26/11 0435 04/25/11 0454  NA 136 135  K 4.7 4.9  CL 103 102  CO2 26 26  GLUCOSE 138* 122*  BUN 23 21  CREATININE 1.16* 1.14*  CALCIUM 8.2* 8.2*  MG -- --  PHOS -- --   Liver Function Tests: No results found for this basename: AST:2,ALT:2,ALKPHOS:2,BILITOT:2,PROT:2,ALBUMIN:2 in the last 72 hours No results found for this basename: LIPASE:2,AMYLASE:2 in the last 72 hours  Basename 04/26/11 0435  AMMONIA 46   CBC:  Basename 04/26/11 0435 04/25/11 0454 04/24/11 0508  WBC 7.4 8.4 --  NEUTROABS -- 6.5 8.1*  HGB 9.3* 9.4* --  HCT 28.1* 28.5* --  MCV 93.4 93.1 --  PLT 89* 80* --   Cardiac Enzymes: No results found for this basename: CKTOTAL:3,CKMB:3,CKMBINDEX:3,TROPONINI:3 in  the last 72 hours BNP: No results found for this basename: POCBNP:3 in the last 72 hours D-Dimer: No results found for this basename: DDIMER:2 in the last 72 hours CBG:  Basename 04/26/11 0739 04/25/11 2038 04/25/11 1624 04/25/11 1121 04/25/11 0732 04/24/11 2059  GLUCAP 124* 180* 115* 148* 122* 151*   Hemoglobin A1C: No results found for this basename: HGBA1C in the last 72 hours Fasting Lipid Panel: No results found for this basename: CHOL,HDL,LDLCALC,TRIG,CHOLHDL,LDLDIRECT in the last 72 hours Thyroid Function Tests: No results found for this basename: TSH,T4TOTAL,FREET4,T3FREE,THYROIDAB in the last 72 hours  Recent Results (from the past 240 hour(s))  URINE CULTURE     Status: Normal   Collection Time   04/18/11  2:47 PM      Component Value Range Status Comment   Specimen Description URINE, CATHETERIZED   Final    Special Requests NONE   Final    Setup Time [id number]   Final    Colony Count NO GROWTH   Final    Culture NO GROWTH   Final    Report Status 04/20/2011 FINAL   Final   CULTURE, BLOOD (ROUTINE X 2)     Status: Normal   Collection Time   04/20/11 12:36 AM      Component Value Range Status Comment   Specimen Description BLOOD RIGHT ANTECUBITAL   Final    Special Requests BOTTLES DRAWN AEROBIC AND ANAEROBIC 10CC   Final    Culture NO GROWTH 6 DAYS   Final  Report Status 04/26/2011 FINAL   Final   CULTURE, BLOOD (ROUTINE X 2)     Status: Normal   Collection Time   04/20/11 12:53 AM      Component Value Range Status Comment   Specimen Description BLOOD RIGHT HAND   Final    Special Requests BOTTLES DRAWN AEROBIC AND ANAEROBIC [facility]   Final    Culture NO GROWTH 6 DAYS   Final    Report Status 04/26/2011 FINAL   Final     Studies/Results: Dg Chest 1 View  04/26/2011  *RADIOLOGY REPORT*  Clinical Data: Left lower lobe pneumonia  CHEST - 1 VIEW  Comparison: Chest radiograph 04/20/2011  Findings: Normal mediastinum and heart silhouette.  There is scoliosis of  the spine.  Lungs are hyperinflated.  No effusion, infiltrate, or pneumothorax. Improved aeration to the left lung base.  IMPRESSION:  1.  Improved aeration o  left lung base. 2.  Emphysematous change.  Original Report Authenticated By: Genevive Bi, M.D.    Medications: Scheduled Meds:    . albuterol  2.5 mg Nebulization Q6H  . furosemide  20 mg Oral Daily  . insulin aspart  0-5 Units Subcutaneous QHS  . insulin aspart  0-9 Units Subcutaneous TID WC  . lactulose  10 g Oral TID  . levofloxacin  500 mg Oral q1800  . levothyroxine  75 mcg Oral Daily  . metoprolol  12.5 mg Oral Daily  . pantoprazole  40 mg Oral Q1200  . polyethylene glycol  17 g Oral Daily  . potassium chloride  10 mEq Oral BID  . pregabalin  100 mg Oral TID  . rifaximin  550 mg Oral BID  . rivaroxaban  10 mg Oral Daily  . sodium chloride  3 mL Intravenous Q12H  . DISCONTD: albuterol  2.5 mg Nebulization Q6H  . DISCONTD: levofloxacin  750 mg Oral q1800   Continuous Infusions:   PRN Meds:.acetaminophen, hydrogen peroxide, magnesium hydroxide, ondansetron (ZOFRAN) IV, oxyCODONE, sodium chloride  Assessment/Plan:  Hip fracture, right:  Surgery done on 10/17 (Wednesday).  Rehab has been delayed by lethargy.  No apparent acute complications otherwise.  Ortho is following.  L basilar Pneumonia: Fever now resolved. Continue Levaquin.  Probably community acquired organisms with very low prob of MRSA pneumonia. F/U CXR reveals improved aeration in the left lower lobe.  Altered mental status/somnolence/lethargy:  The patient is much more alert with minimization of her narcotics. Her chest x-ray reveals improved left lower lobe infiltrate and her ammonia level is normal. A repeat urinalysis has not yet been accomplished, but the patient's pneumonia antibiotic would provide empiric coverage nonetheless.  Cryptogenic Cirrhosis: Patient has h/o liver cirrhosis and is on lactulose, rifaximin.  her ammonia level has proven  to be normal.   Chronic pain: Patient takes Oxycodone at home, She takes OxyContin 20 mg every 4 hours. Will have minimized narcotics as all signs suggested that she was oversedated with pain meds (lethargic, constricted pupils, low BP). she is now much more alert but unfortunately experiencing pain. It is unlikely that we'll be able to completely resolve her pain without causing significant lethargy. I will add nonsteroidals as her renal function allows.   Hypothyroidism: continue synthroid  Periphral neuropathy: continue pregabalin  Diabetes mellitus:  Continue SSI  Normocytic anemia: likely due to surgical blood loss + chronic component related to cirrhosis  Mild/Mod hypotension: in setting of hx of HTN -  her blood pressure has not improved with decrease in her narcotic dosing -  she clinically appears to be dehydrated - I will administer IV fluid and follow her blood pressure trend   Dementia: unclear to me what her baseline MS is -  today, with a decreased narcotic dose, the patient is alert and oriented x4   GERD w/ esophageal motility disorder: no apparent acute issues  Thrombocytopenia, secondary to cirrhosis: stable at present - no evidence of bleeding complications   disposition: We will continue our efforts to rehabilitate the patient. As soon as her blood pressure is stable, her pain well controlled, and she is deemed stable for discharge a ultimate rehabilitation disposition will need to be made.       LOS: 8 days   Cassey Hurrell T 04/26/2011, 10:16 AM

## 2011-04-27 LAB — BASIC METABOLIC PANEL
CO2: 27 mEq/L (ref 19–32)
Chloride: 107 mEq/L (ref 96–112)
Sodium: 137 mEq/L (ref 135–145)

## 2011-04-27 LAB — GLUCOSE, CAPILLARY
Glucose-Capillary: 112 mg/dL — ABNORMAL HIGH (ref 70–99)
Glucose-Capillary: 118 mg/dL — ABNORMAL HIGH (ref 70–99)

## 2011-04-27 MED ORDER — POTASSIUM CHLORIDE CRYS ER 10 MEQ PO TBCR
10.0000 meq | EXTENDED_RELEASE_TABLET | Freq: Every day | ORAL | Status: DC
  Administered 2011-04-28: 10 meq via ORAL
  Filled 2011-04-27: qty 1

## 2011-04-27 NOTE — Progress Notes (Signed)
This RN to the patient's bedside to offer toilet/bedpan in that the patient has not called out to void (since the assumption of care by this RN at 1000). -patient noted to have a pool of urine in her lap and her sheet were completely saturated  Patient cleansed with bedding and gown changed. Dr. Sharon Seller made aware and a request for foley made (which was granted).

## 2011-04-27 NOTE — Progress Notes (Signed)
Subjective: The patient remains alert today. Unfortunately she continues to complain of severe uncontrolled pain in her right hip and neuropathic type pain in both lower extremities. She has no other complaints at present and specifically denies chest pain fevers chills nausea vomiting abdominal pain or headache. Her blood pressure has remained somewhat soft.  Objective: Vital signs in last 24 hours: Temp:  [97.8 F (36.6 C)-98.3 F (36.8 C)] 97.8 F (36.6 C) (10/21 0700) Pulse Rate:  [70-94] 70  (10/21 0700) Resp:  [16-18] 16  (10/21 0700) BP: (89-98)/(51-60) 89/51 mmHg (10/21 0700) SpO2:  [94 %-99 %] 95 % (10/21 0700) Weight change:  Last BM Date: 04/26/11  Intake/Output from previous day: 10/20 0701 - 10/21 0700 In: 2110 [P.O.:560; I.V.:1550] Out: -  Total I/O In: 900 [P.O.:800; I.V.:100] Out: -    Physical Exam: General: no acute resp distress Heart: Regular rate and rhythm, without murmurs, rubs, gallops Lungs: Clear to auscultation bilaterally Abdomen: Soft, nontender, nondistended, positive bowel sounds  Extremities: No clubbing or cyanosis - trace B LE edema  Neuro: Much more alert and oriented x4, nonfocal exam     Lab Results: Basic Metabolic Panel:  Basename 04/27/11 0600 04/26/11 0435  NA 137 136  K 4.1 4.7  CL 107 103  CO2 27 26  GLUCOSE 114* 138*  BUN 20 23  CREATININE 1.11* 1.16*  CALCIUM 8.2* 8.2*  MG -- --  PHOS -- --   Liver Function Tests: No results found for this basename: AST:2,ALT:2,ALKPHOS:2,BILITOT:2,PROT:2,ALBUMIN:2 in the last 72 hours No results found for this basename: LIPASE:2,AMYLASE:2 in the last 72 hours  Basename 04/26/11 0435  AMMONIA 46   CBC:  Basename 04/26/11 0435 04/25/11 0454  WBC 7.4 8.4  NEUTROABS -- 6.5  HGB 9.3* 9.4*  HCT 28.1* 28.5*  MCV 93.4 93.1  PLT 89* 80*   Cardiac Enzymes: No results found for this basename: CKTOTAL:3,CKMB:3,CKMBINDEX:3,TROPONINI:3 in the last 72 hours BNP: No results found for  this basename: POCBNP:3 in the last 72 hours D-Dimer: No results found for this basename: DDIMER:2 in the last 72 hours CBG:  Basename 04/27/11 1103 04/27/11 0729 04/26/11 2316 04/26/11 1627 04/26/11 1226 04/26/11 0739  GLUCAP 112* 96 128* 123* 121* 124*   Hemoglobin A1C: No results found for this basename: HGBA1C in the last 72 hours Fasting Lipid Panel: No results found for this basename: CHOL,HDL,LDLCALC,TRIG,CHOLHDL,LDLDIRECT in the last 72 hours Thyroid Function Tests: No results found for this basename: TSH,T4TOTAL,FREET4,T3FREE,THYROIDAB in the last 72 hours  Recent Results (from the past 240 hour(s))  URINE CULTURE     Status: Normal   Collection Time   04/18/11  2:47 PM      Component Value Range Status Comment   Specimen Description URINE, CATHETERIZED   Final    Special Requests NONE   Final    Setup Time [id number]   Final    Colony Count NO GROWTH   Final    Culture NO GROWTH   Final    Report Status 04/20/2011 FINAL   Final   CULTURE, BLOOD (ROUTINE X 2)     Status: Normal   Collection Time   04/20/11 12:36 AM      Component Value Range Status Comment   Specimen Description BLOOD RIGHT ANTECUBITAL   Final    Special Requests BOTTLES DRAWN AEROBIC AND ANAEROBIC 10CC   Final    Culture NO GROWTH 6 DAYS   Final    Report Status 04/26/2011 FINAL   Final   CULTURE,  BLOOD (ROUTINE X 2)     Status: Normal   Collection Time   04/20/11 12:53 AM      Component Value Range Status Comment   Specimen Description BLOOD RIGHT HAND   Final    Special Requests BOTTLES DRAWN AEROBIC AND ANAEROBIC [facility]   Final    Culture NO GROWTH 6 DAYS   Final    Report Status 04/26/2011 FINAL   Final     Studies/Results: Dg Chest 1 View  04/26/2011  *RADIOLOGY REPORT*  Clinical Data: Left lower lobe pneumonia  CHEST - 1 VIEW  Comparison: Chest radiograph 04/20/2011  Findings: Normal mediastinum and heart silhouette.  There is scoliosis of the spine.  Lungs are hyperinflated.  No  effusion, infiltrate, or pneumothorax. Improved aeration to the left lung base.  IMPRESSION:  1.  Improved aeration o  left lung base. 2.  Emphysematous change.  Original Report Authenticated By: Genevive Bi, M.D.    Medications: Scheduled Meds:    . albuterol  2.5 mg Nebulization Q6H  . donepezil  10 mg Oral QHS  . insulin aspart  0-5 Units Subcutaneous QHS  . insulin aspart  0-9 Units Subcutaneous TID WC  . lactulose  10 g Oral TID  . levofloxacin  500 mg Oral q1800  . levothyroxine  75 mcg Oral Daily  . pantoprazole  40 mg Oral Q1200  . polyethylene glycol  17 g Oral Daily  . potassium chloride  10 mEq Oral BID  . pregabalin  100 mg Oral TID  . rifaximin  550 mg Oral BID  . rivaroxaban  10 mg Oral Daily  . sodium chloride  3 mL Intravenous Q12H   Continuous Infusions:    . sodium chloride 100 mL/hr at 04/27/11 1100   PRN Meds:.acetaminophen, hydrogen peroxide, magnesium hydroxide, ondansetron (ZOFRAN) IV, oxyCODONE, sodium chloride, traMADol  Assessment/Plan:  Hip fracture, right:  Surgery done on 10/17 (Wednesday).  Rehab has been delayed by lethargy.  No apparent acute complications otherwise.  Ortho is following. I hope to be able to resume physical therapy on Monday.  L basilar Pneumonia: Fever now resolved. Continue Levaquin.  Probably community acquired organisms with very low prob of MRSA pneumonia. F/U CXR reveals improved aeration in the left lower lobe. We'll complete 7 days of treatment.  Altered mental status/somnolence/lethargy:  The patient is much more alert with minimization of her narcotics. Her chest x-ray reveals improved left lower lobe infiltrate and her ammonia level is normal. A repeat urinalysis has still not yet been accomplished, but the patient's pneumonia antibiotic would provide empiric coverage nonetheless.  Cryptogenic Cirrhosis: Patient has h/o liver cirrhosis and is on lactulose and rifaximin.  Her ammonia level has proven to be normal.    Chronic pain: Patient takes Oxycodone at home, She takes OxyContin 20 mg every 4 hours. We have minimized narcotics as all signs suggested that she was oversedated with pain meds (lethargic, constricted pupils, low BP). She is now much more alert but unfortunately experiencing pain. It is unlikely that we'll be able to completely resolve her pain without causing significant lethargy.   Hypothyroidism: continue synthroid  Periphral neuropathy: continue pregabalin - adjust as able  Diabetes mellitus:  Continue SSI  Normocytic anemia: likely due to surgical blood loss + chronic component related to cirrhosis - recheck in the a.m.  Mild/Mod hypotension: in setting of hx of HTN -  her blood pressure has not improved with decrease in her narcotic dosing - she clinically appears to be  dehydrated - I will continue to administer IV fluid and follow her blood pressure trend   Dementia: unclear to me what her baseline MS is -  today, with a decreased narcotic dose, the patient is alert and oriented x4 - visitors from her church states that she appears to be at her baseline  GERD w/ esophageal motility disorder: no apparent acute issues  Thrombocytopenia, secondary to cirrhosis: stable at present - no evidence of bleeding complications - we'll follow trend  disposition: We will continue our efforts to rehabilitate the patient. As soon as her blood pressure is stable, her pain well controlled, and she is deemed stable for discharge a ultimate rehabilitation disposition will need to be made. It is expected that physical therapy will reevaluate the patient in the morning.      LOS: 9 days   Vikash Nest T 04/27/2011, 1:50 PM

## 2011-04-27 NOTE — Progress Notes (Signed)
Attempt to insert the foley X2 both unsuccessful. -NT Synetta Fail made 1st attempt observed by P. Arlana Pouch, RN (for check off) -RN P. Arlana Pouch made 2nd attempt Dr. Sharon Seller made aware.

## 2011-04-28 LAB — BASIC METABOLIC PANEL
Calcium: 8.3 mg/dL — ABNORMAL LOW (ref 8.4–10.5)
GFR calc Af Amer: 57 mL/min — ABNORMAL LOW (ref >90)
GFR calc non Af Amer: 49 mL/min — ABNORMAL LOW (ref >90)
Sodium: 139 mEq/L (ref 135–145)

## 2011-04-28 LAB — GLUCOSE, CAPILLARY: Glucose-Capillary: 103 mg/dL — ABNORMAL HIGH (ref 70–99)

## 2011-04-28 LAB — CBC
Platelets: 113 10*3/uL — ABNORMAL LOW (ref 150–400)
RBC: 3.06 MIL/uL — ABNORMAL LOW (ref 3.87–5.11)
WBC: 5.4 10*3/uL (ref 4.0–10.5)

## 2011-04-28 MED ORDER — INSULIN ASPART 100 UNIT/ML ~~LOC~~ SOLN: 0.0000 [IU] | Freq: Three times a day (TID) | SUBCUTANEOUS | Status: DC

## 2011-04-28 MED ORDER — OXYCODONE HCL 5 MG PO TABS: 5.0000 mg | ORAL_TABLET | ORAL | Status: AC | PRN

## 2011-04-28 MED ORDER — MAGNESIUM HYDROXIDE 400 MG/5ML PO SUSP: 30.0000 mL | Freq: Every day | ORAL | Status: AC | PRN

## 2011-04-28 MED ORDER — BIOTENE DRY MOUTH MT LIQD
Freq: Two times a day (BID) | OROMUCOSAL | Status: DC
  Administered 2011-04-28: 08:00:00 via OROMUCOSAL

## 2011-04-28 MED ORDER — INSULIN ASPART 100 UNIT/ML ~~LOC~~ SOLN: 3.0000 [IU] | Freq: Every day | SUBCUTANEOUS | Status: DC

## 2011-04-28 MED ORDER — RIVAROXABAN 10 MG PO TABS: 10.0000 mg | ORAL_TABLET | Freq: Every day | ORAL | Status: DC

## 2011-04-28 MED ORDER — PANTOPRAZOLE SODIUM 40 MG PO TBEC: 40.0000 mg | DELAYED_RELEASE_TABLET | Freq: Every day | ORAL | Status: DC

## 2011-04-28 MED ORDER — LEVOFLOXACIN 500 MG PO TABS: 500.0000 mg | ORAL_TABLET | Freq: Every day | ORAL | Status: AC

## 2011-04-28 MED ORDER — TRIAZOLAM 0.25 MG PO TABS: 0.2500 mg | ORAL_TABLET | Freq: Every evening | ORAL | Status: DC | PRN

## 2011-04-28 NOTE — Progress Notes (Signed)
UR Chart Review Completed  

## 2011-04-28 NOTE — Consult Note (Signed)
Pt d/c today by MD to [facility]. Pt's son and facility aware and agreeable. Pt will transfer via [facility].  No other needs reported.  [name]

## 2011-04-28 NOTE — Progress Notes (Signed)
Pt being turned by 2 nurse techs. Informed by Samella Parr, NT that while helping to turn pt she caused a skin tear on her left forearm. vaseline guaze dressing applied. No reaction by patient.

## 2011-04-28 NOTE — Discharge Summary (Signed)
DISCHARGE SUMMARY  Melissa ROBERSON  MR#: [id number]  DOB:Aug 22, 1922  Date of Admission: 04/18/2011 Date of Discharge: 04/28/2011  Attending Physician:Chanel Mckesson,JAI  Patient's FAO:ZHYQM,VHQIONGE J., MD  Consults:Treatment Team:  Darreld Mclean Randall An, MD  BRIEF HOSPITAL COURSE This is an 77 year old female with a past medical history significant for cryptogenic cirrhosis presenting to the ED via EMS for right hip pain. The patient was doing well until this morning when she tried to get out of bed and go to the bathroom and accidentally fell on the floor. Subsequently she called her son who was fixing coffee in the kitchen and when he came she found mother in the floor complaining of right leg pain. Subsequently EMS was contacted and patient brought to the ED. Evaluation reveals a right nondisplaced hip fracture. She has received 2 doses of fentanyl 50 mcg each with good relief of pain. At the time of my evaluation the patient was pain-free. Unfortunately she was not able to provide much of a history because of underlying dementia. She was able to answer my questions to yes and no. She wanted to go to the bathroom to urinate despite having a Foley catheter in with good drainage of urine   Discharge Diagnoses:Hip fracture, right:  Surgery done on 10/17 (Wednesday). Rehab has been delayed by lethargy. No apparent acute complications otherwise. Ortho is following. She has a bed available for further skilled needas and should be able to complete her therapy at the nursing facility.  L basilar Pneumonia:  Fever now resolved. Continue Levaquin, d#4/7 Probably community acquired organisms with very low prob of MRSA pneumonia. F/U CXR reveals improved aeration in the left lower lobe. We'll complete 7 days of treatment.  Will need follow up CXR in about 2 months  Altered mental status/somnolence/lethargy:  This was thought to be multifactorial.  The patient is much more alert with  minimization of her narcotics. Her chest x-ray reveals improved left lower lobe infiltrate and her ammonia level was normal.  I would not place her on any long acting opiates, and would optimize her comfort non-pharmacollogically   Cryptogenic Cirrhosis:  Patient has h/o liver cirrhosis and is on lactulose and rifaximin. Her ammonia level has proven to be normal.   Chronic pain:  Patient takes Oxycodone at home, She takes OxyContin 20 mg every 4 hours. We have minimized narcotics as all signs suggested that she was oversedated with pain meds (lethargic, constricted pupils, low BP). She is now much more alert but unfortunately experiencing pain. It is unlikely that we'll be able to completely resolve her pain without causing significant lethargy.   Hypothyroidism: continue synthroid. Needs TSH as outpatient once all of her Acute issues have resolved  Periphral neuropathy: continue pregabalin.   Diabetes mellitus: Continue SSI-Will need Sliding scale coverage while at Nursing home  Normocytic anemia: likely due to surgical blood loss + chronic component related to cirrhosis.. Stable during hospital,.  Mild/Mod hypotension: in setting of hx of HTN -wopuld hold her Metoprolol as an outpatient or decrease it.  She is not ambulant much and I believe her blood pressure is taken when she is either medicated with opiates, or when she is supine-I would consider a standing blood pressure as the best surrogate in an 77 yr old female.  Dementia: mild to moderate  GERD w/ esophageal motility disorder: no apparent acute issues   Thrombocytopenia, secondary to cirrhosis: stable at present - no evidence of bleeding complications -needs a CBC in 1 week  disposition:  SNF    Present on Admission:  .Hip fracture, right    Current Discharge Medication List    START taking these medications   Details   insulin aspart (NOVOLOG) 100 UNIT/ML injection Inject 3 Units into the skin at bedtime. Qty: 10 mL,  Refills: 0     insulin aspart (NOVOLOG) 100 UNIT/ML injection Inject 0-9 Units into the skin 3 (three) times daily with meals. Qty: 10 mL, Refills: 0    magnesium hydroxide (MILK OF MAGNESIA) 400 MG/5ML suspension Take 30 mLs by mouth daily as needed for constipation. Qty: 360 mL, Refills: 0    oxyCODONE (OXY IR/ROXICODONE) 5 MG immediate release tablet Take 1 tablet (5 mg total) by mouth every 4 (four) hours as needed. Qty: 30 tablet, Refills: 0    rivaroxaban (XARELTO) 10 MG TABS tablet Take 1 tablet (10 mg total) by mouth daily. Qty: 12 tablet        CONTINUE these medications which have CHANGED   Details  triazolam (HALCION) 0.25 MG tablet Take 1 tablet (0.25 mg total) by mouth at bedtime as needed. Confusion and sleep aid Qty: 30 tablet, Refills: 0      CONTINUE these medications which have NOT CHANGED   Details  donepezil (ARICEPT) 10 MG tablet Take 10 mg by mouth at bedtime.      furosemide (LASIX) 20 MG tablet Take 20 mg by mouth daily. Takes at lunchtime    lactulose (CEPHULAC) 10 G packet Take 10 g by mouth 3 (three) times daily as needed. constipation    levothyroxine (SYNTHROID, LEVOTHROID) 75 MCG tablet Take 75 mcg by mouth daily.     metoprolol (TOPROL-XL) 50 MG 24 hr tablet Take 25 mg by mouth daily.      potassium chloride (KLOR-CON) 10 MEQ CR tablet Take 10 mEq by mouth 2 (two) times daily.     pregabalin (LYRICA) 100 MG capsule Take 100 mg by mouth 3 (three) times daily.     XIFAXAN 550 MG TABS TAKE 2 TABLETS BY MOUTH  ONCE DAILY. Qty: 62 tablet, Refills: 5    calcium carbonate (OS-CAL) 600 MG TABS Take 600 mg by mouth 2 (two) times daily with a meal.      esomeprazole (NEXIUM) 40 MG capsule Take 40 mg by mouth daily before breakfast. Med is expired but patient very seldom takes      STOP taking these medications     metoCLOPramide (REGLAN) 5 MG tablet      oxyCODONE (OXYCONTIN) 10 MG 12 hr tablet      L-Methylfolate-B6-B12 (NEURPATH-B PO)       solifenacin (VESICARE) 5 MG tablet        Day of Discharge BP 121/62  Pulse 87  Temp(Src) 98.2 F (36.8 C) (Oral)  Resp 18  Ht 5\' 4"  (1.626 m)  Wt 61.2 kg (134 lb 14.7 oz)  BMI 23.16 kg/m2  SpO2 96%  Physical Exam: EOMI, No pallor, frail CTA B Abd soft, nt,nd S1 S2 no M/R/G  Results for orders placed during the hospital encounter of 04/18/11 (from the past 24 hour(s))  GLUCOSE, CAPILLARY     Status: Abnormal   Collection Time   04/27/11  4:42 PM      Component Value Range   Glucose-Capillary 118 (*) 70 - 99 (mg/dL)  GLUCOSE, CAPILLARY     Status: Abnormal   Collection Time   04/27/11 10:04 PM      Component Value Range   Glucose-Capillary 121 (*) 70 - 99 (  mg/dL)   Comment 1 Notify RN    BASIC METABOLIC PANEL     Status: Abnormal   Collection Time   04/28/11  7:00 AM      Component Value Range   Sodium 139  135 - 145 (mEq/L)   Potassium 3.9  3.5 - 5.1 (mEq/L)   Chloride 109  96 - 112 (mEq/L)   CO2 23  19 - 32 (mEq/L)   Glucose, Bld 97  70 - 99 (mg/dL)   BUN 17  6 - 23 (mg/dL)   Creatinine, Ser 1.61  0.50 - 1.10 (mg/dL)   Calcium 8.3 (*) 8.4 - 10.5 (mg/dL)   GFR calc non Af Amer 49 (*) >90 (mL/min)   GFR calc Af Amer 57 (*) >90 (mL/min)  CBC     Status: Abnormal   Collection Time   04/28/11  7:00 AM      Component Value Range   WBC 5.4  4.0 - 10.5 (K/uL)   RBC 3.06 (*) 3.87 - 5.11 (MIL/uL)   Hemoglobin 9.4 (*) 12.0 - 15.0 (g/dL)   HCT 09.6 (*) 04.5 - 46.0 (%)   MCV 91.8  78.0 - 100.0 (fL)   MCH 30.7  26.0 - 34.0 (pg)   MCHC 33.5  30.0 - 36.0 (g/dL)   RDW 40.9  81.1 - 91.4 (%)   Platelets 113 (*) 150 - 400 (K/uL)  GLUCOSE, CAPILLARY     Status: Abnormal   Collection Time   04/28/11  8:01 AM      Component Value Range   Glucose-Capillary 103 (*) 70 - 99 (mg/dL)  GLUCOSE, CAPILLARY     Status: Abnormal   Collection Time   04/28/11 11:35 AM      Component Value Range   Glucose-Capillary 127 (*) 70 - 99 (mg/dL)    Disposition: SNF   Follow-up  Appts: Discharge Orders    Future Orders Please Complete By Expires   Walker rolling      Commode elevated 3 in 1      Ambulatory referral to Physical Therapy      Ambulatory referral to Occupational Therapy      Weight bearing as tolerated      24 hour supervision needed      Follow-up primary physician in 1 week.      Scheduling Instructions:   Nurse to schedule appointment before discharge.   Follow-up with outpatient rehabilitation in 1 week.      Scheduling Instructions:   Nurse to schedule appointment before discharge.      Follow-up with Dr. At [facility],  in 0 weeks.   Tests Needing Follow-up: See Hospital course  Signed: Pleas Koch 04/28/2011, 12:50 PM

## 2011-04-28 NOTE — Progress Notes (Signed)
Report called to Harriette Ohara, LPN at [facility]. Awaiting arrival of [facility] to transport pt to NH.

## 2011-04-28 NOTE — Progress Notes (Signed)
Subjective: 5 Days Post-Op Procedure(s) (LRB): OPEN REDUCTION INTERNAL FIXATION HIP (Right)   Objective: Vital signs in last 24 hours: Temp:  [98.1 F (36.7 C)-98.3 F (36.8 C)] 98.2 F (36.8 C) (10/22 0503) Pulse Rate:  [79-90] 87  (10/22 0503) Resp:  [18-19] 18  (10/22 0503) BP: (106-137)/(56-74) 121/62 mmHg (10/22 0503) SpO2:  [94 %-96 %] 96 % (10/22 0503)  Intake/Output from previous day: 10/21 0701 - 10/22 0700 In: 3309 [P.O.:1240; I.V.:2069] Out: 4 [Urine:3; Stool:1] Intake/Output this shift:     Basename 04/28/11 0700 04/26/11 0435  HGB 9.4* 9.3*    Basename 04/28/11 0700 04/26/11 0435  WBC 5.4 7.4  RBC 3.06* 3.01*  HCT 28.1* 28.1*  PLT 113* 89*    Basename 04/28/11 0700 04/27/11 0600  NA 139 137  K 3.9 4.1  CL 109 107  CO2 23 27  BUN 17 20  CREATININE 1.01 1.11*  GLUCOSE 97 114*  CALCIUM 8.3* 8.2*   No results found for this basename: LABPT:2,INR:2 in the last 72 hours  Incision: scant drainage  Assessment/Plan: 5 Days Post-Op Procedure(s) (LRB): OPEN REDUCTION INTERNAL FIXATION HIP (Right) Up with therapy as tolerated discharge when medical service thinks ok   Post op appointment with Dr Hilda Lias in 2 weeks, remove staples in 7 days on POD # 12 Weight bearing status as tolerated  DVT  prevention with pharmacological agent x 28 days    Fuller Canada 04/28/2011, 12:38 PM

## 2011-05-01 ENCOUNTER — Encounter (HOSPITAL_COMMUNITY): Payer: Self-pay | Admitting: Orthopaedic Surgery

## 2012-01-28 DIAGNOSIS — J189 Pneumonia, unspecified organism: Secondary | ICD-10-CM

## 2012-02-03 HISTORY — PX: ESOPHAGOGASTRODUODENOSCOPY: SHX1529

## 2012-02-10 HISTORY — PX: ESOPHAGOGASTRODUODENOSCOPY: SHX1529

## 2013-01-16 ENCOUNTER — Inpatient Hospital Stay (HOSPITAL_COMMUNITY)
Admission: EM | Admit: 2013-01-16 | Discharge: 2013-01-31 | DRG: 469 | Disposition: A | Discharge status: Skilled Nursing Facility | Payer: PRIVATE HEALTH INSURANCE | Attending: Internal Medicine | Admitting: Internal Medicine

## 2013-01-16 ENCOUNTER — Encounter (HOSPITAL_COMMUNITY): Payer: Self-pay

## 2013-01-16 ENCOUNTER — Emergency Department (HOSPITAL_COMMUNITY): Payer: PRIVATE HEALTH INSURANCE

## 2013-01-16 ENCOUNTER — Inpatient Hospital Stay (HOSPITAL_COMMUNITY): Payer: PRIVATE HEALTH INSURANCE

## 2013-01-16 DIAGNOSIS — IMO0002 Reserved for concepts with insufficient information to code with codable children: Secondary | ICD-10-CM

## 2013-01-16 DIAGNOSIS — I480 Paroxysmal atrial fibrillation: Secondary | ICD-10-CM

## 2013-01-16 DIAGNOSIS — K219 Gastro-esophageal reflux disease without esophagitis: Secondary | ICD-10-CM

## 2013-01-16 DIAGNOSIS — S72001S Fracture of unspecified part of neck of right femur, sequela: Secondary | ICD-10-CM

## 2013-01-16 DIAGNOSIS — R110 Nausea: Secondary | ICD-10-CM

## 2013-01-16 DIAGNOSIS — F411 Generalized anxiety disorder: Secondary | ICD-10-CM

## 2013-01-16 DIAGNOSIS — D649 Anemia, unspecified: Secondary | ICD-10-CM

## 2013-01-16 DIAGNOSIS — R1310 Dysphagia, unspecified: Secondary | ICD-10-CM | POA: Diagnosis not present

## 2013-01-16 DIAGNOSIS — A419 Sepsis, unspecified organism: Secondary | ICD-10-CM

## 2013-01-16 DIAGNOSIS — N179 Acute kidney failure, unspecified: Secondary | ICD-10-CM | POA: Diagnosis present

## 2013-01-16 DIAGNOSIS — Y9210 Unspecified residential institution as the place of occurrence of the external cause: Secondary | ICD-10-CM | POA: Diagnosis present

## 2013-01-16 DIAGNOSIS — Z79899 Other long term (current) drug therapy: Secondary | ICD-10-CM

## 2013-01-16 DIAGNOSIS — S7291XA Unspecified fracture of right femur, initial encounter for closed fracture: Secondary | ICD-10-CM

## 2013-01-16 DIAGNOSIS — F0390 Unspecified dementia without behavioral disturbance: Secondary | ICD-10-CM | POA: Diagnosis present

## 2013-01-16 DIAGNOSIS — T502X5A Adverse effect of carbonic-anhydrase inhibitors, benzothiadiazides and other diuretics, initial encounter: Secondary | ICD-10-CM | POA: Diagnosis not present

## 2013-01-16 DIAGNOSIS — S72009A Fracture of unspecified part of neck of unspecified femur, initial encounter for closed fracture: Secondary | ICD-10-CM

## 2013-01-16 DIAGNOSIS — J9600 Acute respiratory failure, unspecified whether with hypoxia or hypercapnia: Secondary | ICD-10-CM

## 2013-01-16 DIAGNOSIS — G9340 Encephalopathy, unspecified: Secondary | ICD-10-CM

## 2013-01-16 DIAGNOSIS — E119 Type 2 diabetes mellitus without complications: Secondary | ICD-10-CM

## 2013-01-16 DIAGNOSIS — G609 Hereditary and idiopathic neuropathy, unspecified: Secondary | ICD-10-CM

## 2013-01-16 DIAGNOSIS — F329 Major depressive disorder, single episode, unspecified: Secondary | ICD-10-CM | POA: Diagnosis present

## 2013-01-16 DIAGNOSIS — B9620 Unspecified Escherichia coli [E. coli] as the cause of diseases classified elsewhere: Secondary | ICD-10-CM | POA: Diagnosis present

## 2013-01-16 DIAGNOSIS — B372 Candidiasis of skin and nail: Secondary | ICD-10-CM

## 2013-01-16 DIAGNOSIS — Z882 Allergy status to sulfonamides status: Secondary | ICD-10-CM

## 2013-01-16 DIAGNOSIS — K7460 Unspecified cirrhosis of liver: Secondary | ICD-10-CM

## 2013-01-16 DIAGNOSIS — G9349 Other encephalopathy: Secondary | ICD-10-CM | POA: Diagnosis not present

## 2013-01-16 DIAGNOSIS — R630 Anorexia: Secondary | ICD-10-CM

## 2013-01-16 DIAGNOSIS — R400 Somnolence: Secondary | ICD-10-CM

## 2013-01-16 DIAGNOSIS — F3289 Other specified depressive episodes: Secondary | ICD-10-CM | POA: Diagnosis present

## 2013-01-16 DIAGNOSIS — Z886 Allergy status to analgesic agent status: Secondary | ICD-10-CM

## 2013-01-16 DIAGNOSIS — K224 Dyskinesia of esophagus: Secondary | ICD-10-CM | POA: Diagnosis present

## 2013-01-16 DIAGNOSIS — S72309A Unspecified fracture of shaft of unspecified femur, initial encounter for closed fracture: Secondary | ICD-10-CM | POA: Diagnosis present

## 2013-01-16 DIAGNOSIS — D62 Acute posthemorrhagic anemia: Secondary | ICD-10-CM | POA: Diagnosis not present

## 2013-01-16 DIAGNOSIS — G8929 Other chronic pain: Secondary | ICD-10-CM | POA: Diagnosis present

## 2013-01-16 DIAGNOSIS — Z9089 Acquired absence of other organs: Secondary | ICD-10-CM

## 2013-01-16 DIAGNOSIS — R627 Adult failure to thrive: Secondary | ICD-10-CM | POA: Diagnosis present

## 2013-01-16 DIAGNOSIS — D6489 Other specified anemias: Secondary | ICD-10-CM | POA: Diagnosis present

## 2013-01-16 DIAGNOSIS — J189 Pneumonia, unspecified organism: Secondary | ICD-10-CM

## 2013-01-16 DIAGNOSIS — G4700 Insomnia, unspecified: Secondary | ICD-10-CM

## 2013-01-16 DIAGNOSIS — Z66 Do not resuscitate: Secondary | ICD-10-CM | POA: Diagnosis present

## 2013-01-16 DIAGNOSIS — S7290XA Unspecified fracture of unspecified femur, initial encounter for closed fracture: Secondary | ICD-10-CM

## 2013-01-16 DIAGNOSIS — F068 Other specified mental disorders due to known physiological condition: Secondary | ICD-10-CM

## 2013-01-16 DIAGNOSIS — D696 Thrombocytopenia, unspecified: Secondary | ICD-10-CM

## 2013-01-16 DIAGNOSIS — D6959 Other secondary thrombocytopenia: Secondary | ICD-10-CM | POA: Diagnosis present

## 2013-01-16 DIAGNOSIS — E876 Hypokalemia: Secondary | ICD-10-CM | POA: Diagnosis not present

## 2013-01-16 DIAGNOSIS — I10 Essential (primary) hypertension: Secondary | ICD-10-CM | POA: Diagnosis present

## 2013-01-16 DIAGNOSIS — E039 Hypothyroidism, unspecified: Secondary | ICD-10-CM

## 2013-01-16 DIAGNOSIS — K7469 Other cirrhosis of liver: Secondary | ICD-10-CM

## 2013-01-16 DIAGNOSIS — K220 Achalasia of cardia: Secondary | ICD-10-CM

## 2013-01-16 DIAGNOSIS — S72033A Displaced midcervical fracture of unspecified femur, initial encounter for closed fracture: Principal | ICD-10-CM | POA: Diagnosis present

## 2013-01-16 DIAGNOSIS — W19XXXA Unspecified fall, initial encounter: Secondary | ICD-10-CM

## 2013-01-16 DIAGNOSIS — R6521 Severe sepsis with septic shock: Secondary | ICD-10-CM | POA: Diagnosis not present

## 2013-01-16 DIAGNOSIS — W010XXA Fall on same level from slipping, tripping and stumbling without subsequent striking against object, initial encounter: Secondary | ICD-10-CM | POA: Diagnosis present

## 2013-01-16 DIAGNOSIS — K3184 Gastroparesis: Secondary | ICD-10-CM

## 2013-01-16 DIAGNOSIS — S72002A Fracture of unspecified part of neck of left femur, initial encounter for closed fracture: Secondary | ICD-10-CM

## 2013-01-16 DIAGNOSIS — A4151 Sepsis due to Escherichia coli [E. coli]: Secondary | ICD-10-CM | POA: Diagnosis not present

## 2013-01-16 DIAGNOSIS — E46 Unspecified protein-calorie malnutrition: Secondary | ICD-10-CM

## 2013-01-16 DIAGNOSIS — J95821 Acute postprocedural respiratory failure: Secondary | ICD-10-CM | POA: Diagnosis not present

## 2013-01-16 DIAGNOSIS — Z888 Allergy status to other drugs, medicaments and biological substances status: Secondary | ICD-10-CM

## 2013-01-16 DIAGNOSIS — Z880 Allergy status to penicillin: Secondary | ICD-10-CM

## 2013-01-16 DIAGNOSIS — N390 Urinary tract infection, site not specified: Secondary | ICD-10-CM

## 2013-01-16 DIAGNOSIS — I4891 Unspecified atrial fibrillation: Secondary | ICD-10-CM | POA: Diagnosis present

## 2013-01-16 DIAGNOSIS — Z794 Long term (current) use of insulin: Secondary | ICD-10-CM

## 2013-01-16 DIAGNOSIS — Z8709 Personal history of other diseases of the respiratory system: Secondary | ICD-10-CM | POA: Insufficient documentation

## 2013-01-16 LAB — CBC WITH DIFFERENTIAL/PLATELET
Eosinophils Absolute: 0.2 10*3/uL (ref 0.0–0.7)
Hemoglobin: 11 g/dL — ABNORMAL LOW (ref 12.0–15.0)
Lymphocytes Relative: 11 % — ABNORMAL LOW (ref 12–46)
MCH: 28.7 pg (ref 26.0–34.0)
MCHC: 33 g/dL (ref 30.0–36.0)
Monocytes Absolute: 0.4 10*3/uL (ref 0.1–1.0)
Neutrophils Relative %: 79 % — ABNORMAL HIGH (ref 43–77)
Platelets: 68 10*3/uL — ABNORMAL LOW (ref 150–400)
RBC: 3.83 MIL/uL — ABNORMAL LOW (ref 3.87–5.11)

## 2013-01-16 LAB — POCT I-STAT, CHEM 8
BUN: 33 mg/dL — ABNORMAL HIGH (ref 6–23)
Calcium, Ion: 1.14 mmol/L (ref 1.13–1.30)
Creatinine, Ser: 1.8 mg/dL — ABNORMAL HIGH (ref 0.50–1.10)
TCO2: 18 mmol/L (ref 0–100)

## 2013-01-16 LAB — COMPREHENSIVE METABOLIC PANEL
ALT: 24 U/L (ref 0–35)
AST: 46 U/L — ABNORMAL HIGH (ref 0–37)
Albumin: 2.9 g/dL — ABNORMAL LOW (ref 3.5–5.2)
Alkaline Phosphatase: 199 U/L — ABNORMAL HIGH (ref 39–117)
Calcium: 8.6 mg/dL (ref 8.4–10.5)
Potassium: 4.6 mEq/L (ref 3.5–5.1)
Sodium: 144 mEq/L (ref 135–145)
Total Protein: 6.7 g/dL (ref 6.0–8.3)

## 2013-01-16 LAB — APTT: aPTT: 39 seconds — ABNORMAL HIGH (ref 24–37)

## 2013-01-16 MED ORDER — PROMETHAZINE HCL 25 MG PO TABS: 25.0000 mg | ORAL_TABLET | Freq: Four times a day (QID) | ORAL | Status: DC | PRN

## 2013-01-16 MED ORDER — SODIUM CHLORIDE 0.9 % IV SOLN: Freq: Once | INTRAVENOUS | Status: DC

## 2013-01-16 MED ORDER — CALCIUM CARBONATE 600 MG PO TABS: 600.0000 mg | ORAL_TABLET | Freq: Two times a day (BID) | ORAL | Status: DC

## 2013-01-16 MED ORDER — DONEPEZIL HCL 10 MG PO TABS
10.0000 mg | ORAL_TABLET | Freq: Every day | ORAL | Status: DC
  Filled 2013-01-16 (×3): qty 1

## 2013-01-16 MED ORDER — FENTANYL CITRATE 0.05 MG/ML IJ SOLN: 50.0000 ug | Freq: Once | INTRAMUSCULAR | Status: DC

## 2013-01-16 MED ORDER — CALCIUM CARBONATE 1250 (500 CA) MG PO TABS
1.0000 | ORAL_TABLET | Freq: Two times a day (BID) | ORAL | Status: DC
  Administered 2013-01-17: 500 mg via ORAL
  Filled 2013-01-16 (×5): qty 1

## 2013-01-16 MED ORDER — RIFAXIMIN 550 MG PO TABS
550.0000 mg | ORAL_TABLET | Freq: Every day | ORAL | Status: DC
  Administered 2013-01-17: 550 mg via ORAL
  Filled 2013-01-16 (×4): qty 1

## 2013-01-16 MED ORDER — OXYCODONE HCL 5 MG PO TABS
5.0000 mg | ORAL_TABLET | Freq: Four times a day (QID) | ORAL | Status: DC | PRN
  Administered 2013-01-17: 5 mg via ORAL
  Filled 2013-01-16: qty 1

## 2013-01-16 MED ORDER — OXYCODONE HCL 5 MG PO CAPS: 5.0000 mg | ORAL_CAPSULE | Freq: Four times a day (QID) | ORAL | Status: DC | PRN

## 2013-01-16 MED ORDER — POLYETHYLENE GLYCOL 3350 17 G PO PACK
17.0000 g | PACK | Freq: Every day | ORAL | Status: DC
  Administered 2013-01-17: 17 g via ORAL
  Filled 2013-01-16 (×4): qty 1

## 2013-01-16 MED ORDER — SODIUM CHLORIDE 0.9 % IV SOLN
INTRAVENOUS | Status: DC
  Administered 2013-01-17: 50 mL/h via INTRAVENOUS

## 2013-01-16 MED ORDER — FENTANYL CITRATE 0.05 MG/ML IJ SOLN
50.0000 ug | Freq: Once | INTRAMUSCULAR | Status: AC
  Administered 2013-01-16: 50 ug via INTRAVENOUS

## 2013-01-16 MED ORDER — FENTANYL CITRATE 0.05 MG/ML IJ SOLN
50.0000 ug | INTRAMUSCULAR | Status: DC | PRN
  Administered 2013-01-16 – 2013-01-17 (×3): 50 ug via INTRAVENOUS
  Filled 2013-01-16 (×3): qty 2

## 2013-01-16 MED ORDER — VITAMIN C 500 MG PO TABS
500.0000 mg | ORAL_TABLET | Freq: Every day | ORAL | Status: DC
  Administered 2013-01-17: 500 mg via ORAL
  Filled 2013-01-16 (×4): qty 1

## 2013-01-16 MED ORDER — INSULIN ASPART 100 UNIT/ML ~~LOC~~ SOLN: 0.0000 [IU] | SUBCUTANEOUS | Status: DC

## 2013-01-16 MED ORDER — PREGABALIN 25 MG PO CAPS
50.0000 mg | ORAL_CAPSULE | Freq: Three times a day (TID) | ORAL | Status: DC
  Administered 2013-01-17 (×2): 50 mg via ORAL
  Filled 2013-01-16 (×2): qty 1

## 2013-01-16 MED ORDER — MAGNESIUM HYDROXIDE 400 MG/5ML PO SUSP: 5.0000 mL | Freq: Every day | ORAL | Status: DC | PRN

## 2013-01-16 MED ORDER — SODIUM CHLORIDE 0.9 % IV SOLN
Freq: Once | INTRAVENOUS | Status: AC
  Administered 2013-01-16: 17:00:00 via INTRAVENOUS

## 2013-01-16 MED ORDER — FENTANYL CITRATE 0.05 MG/ML IJ SOLN
25.0000 ug | Freq: Once | INTRAMUSCULAR | Status: AC
  Administered 2013-01-16: 25 ug via INTRAVENOUS

## 2013-01-16 MED ORDER — LEVOTHYROXINE SODIUM 75 MCG PO TABS
75.0000 ug | ORAL_TABLET | Freq: Every day | ORAL | Status: DC
  Administered 2013-01-17 – 2013-01-20 (×2): 75 ug via ORAL
  Filled 2013-01-16 (×5): qty 1

## 2013-01-16 MED ORDER — DOCUSATE SODIUM 100 MG PO CAPS
100.0000 mg | ORAL_CAPSULE | Freq: Two times a day (BID) | ORAL | Status: DC
  Administered 2013-01-17: 100 mg via ORAL
  Filled 2013-01-16 (×2): qty 1

## 2013-01-16 MED ORDER — FENTANYL CITRATE 0.05 MG/ML IJ SOLN
25.0000 ug | Freq: Once | INTRAMUSCULAR | Status: AC
  Administered 2013-01-16: 25 ug via INTRAVENOUS
  Filled 2013-01-16: qty 2

## 2013-01-16 MED ORDER — VITAMIN C 500 MG PO TABS: 500.0000 mg | ORAL_TABLET | Freq: Every day | ORAL | Status: DC

## 2013-01-16 MED ORDER — INSULIN ASPART 100 UNIT/ML ~~LOC~~ SOLN: 0.0000 [IU] | Freq: Three times a day (TID) | SUBCUTANEOUS | Status: DC

## 2013-01-16 MED ORDER — ADULT MULTIVITAMIN W/MINERALS CH
1.0000 | ORAL_TABLET | Freq: Every day | ORAL | Status: DC
  Administered 2013-01-17: 1 via ORAL
  Filled 2013-01-16 (×2): qty 1

## 2013-01-16 MED ORDER — PANTOPRAZOLE SODIUM 40 MG PO TBEC
40.0000 mg | DELAYED_RELEASE_TABLET | Freq: Every day | ORAL | Status: DC
  Administered 2013-01-17: 40 mg via ORAL
  Filled 2013-01-16: qty 1

## 2013-01-16 NOTE — Progress Notes (Signed)
Orthopedic Tech Progress Note Patient Details:  [name] 07-02-1923 [id number]  Ortho Devices Type of Ortho Device: Knee Immobilizer Ortho Device/Splint Location: RLE Ortho Device/Splint Interventions: Ordered;Application   Jennye Moccasin 01/16/2013, 8:01 PM

## 2013-01-16 NOTE — H&P (Signed)
Patient's PCP: Toma Deiters, MD  Chief Complaint: Fall with bilateral hip pain  History of Present Illness: [name] is a 79 y.o. Caucasian female with history of cryptogenic cirrhosis, thrombocytopenia, GERD, achalasia, history of aspiration pneumonia in July of 2013 at [facility], dementia, hypothyroidism, hypertension, and depression who presents with the above complaints.  Patient provided most of the history.  She reported that after her lunch at around 130 p.m. an aide was helping out at the chair.  She reported that the aides legs and her legs got tangled up and she had a mechanical fall.  She immediately had right leg and left hip pain.  She was brought to the emergency department and was found to have right distal femur fracture and left femoral subcapital fracture.  Hospitalist service was asked to admit the patient for further care and management.  Patient denies any recent fevers, chills, nausea, vomiting, chest pain, shortness of breath, abdominal pain, diarrhea, headaches, or new vision changes.  Reviewing patient's records from the skilled nursing facility, [facility], patient has a guardian and may be a ward of the state, Barnie Alderman is patient's guardian social worker [phone] Ext [phone].  Attempt was made to call the above number, was off hours did not leave a message.  Review of Systems: All systems reviewed with the patient and positive as per history of present illness, otherwise all other systems are negative.  Past Medical History  Diagnosis Date  . Cirrhosis 1992    Cryptogenic  . Esophageal motility disorder 2006    Nonspecific  . GERD (gastroesophageal reflux disease)   . Peripheral neuropathy   . Insomnia   . Dementia   . Hypothyroid   . Anxiety disorder   . Arthritis   . Hypertension   . Depression    Past Surgical History  Procedure Laterality Date  . Appendectomy    . Vesicovaginal fistula closure w/ tah    . Eye surgery    .  Abdominal hysterectomy    . Orif hip fracture  04/23/2011    Procedure: OPEN REDUCTION INTERNAL FIXATION HIP;  Surgeon: Darreld Mclean;  Location: [facility];  Service: Orthopedics;  Laterality: Right;   Family History  Problem Relation Age of Onset  . Family history unknown: Yes   History   Social History  . Marital Status: Widowed    Spouse Name: N/A    Number of Children: N/A  . Years of Education: N/A   Occupational History  . Not on file.   Social History Main Topics  . Smoking status: Never Smoker   . Smokeless tobacco: Never Used  . Alcohol Use: No  . Drug Use: No  . Sexually Active: No   Other Topics Concern  . Not on file   Social History Narrative  . No narrative on file   Allergies: Aspirin; Codeine; Morphine and related; Penicillins; Sulfonamide derivatives; and Zolpidem tartrate  Home Meds: Prior to Admission medications   Medication Sig Start Date End Date Taking? Authorizing Provider  ascorbic acid (VITAMIN C) 500 MG tablet Take 500 mg by mouth daily.   Yes Historical Provider, MD  calcium carbonate (OS-CAL) 600 MG TABS Take 600 mg by mouth 2 (two) times daily with a meal.     Yes Historical Provider, MD  cetirizine (ZYRTEC) 10 MG tablet Take 10 mg by mouth daily.   Yes Historical Provider, MD  docusate sodium (COLACE) 100 MG capsule Take 100 mg by mouth 2 (two)  times daily.   Yes Historical Provider, MD  donepezil (ARICEPT) 10 MG tablet Take 10 mg by mouth at bedtime.     Yes Historical Provider, MD  furosemide (LASIX) 20 MG tablet Take 20 mg by mouth daily. Takes at lunchtime   Yes Historical Provider, MD  insulin aspart (NOVOLOG) 100 UNIT/ML injection Inject 0-9 Units into the skin 3 (three) times daily with meals. 70-120=0units, 121-150=1units, 151-200=2units, 201-250=3units, 251-300=5units, 301-350=7units, 351-400=9units >401=Call MD   Yes Historical Provider, MD  levothyroxine (SYNTHROID, LEVOTHROID) 75 MCG tablet Take 75 mcg by mouth daily.    Yes  Historical Provider, MD  magnesium hydroxide (MILK OF MAGNESIA) 400 MG/5ML suspension Take 5 mLs by mouth daily as needed for constipation.   Yes Historical Provider, MD  Multiple Vitamin (MULTIVITAMIN WITH MINERALS) TABS Take 1 tablet by mouth daily.   Yes Historical Provider, MD  omeprazole (PRILOSEC) 20 MG capsule Take 20 mg by mouth daily.   Yes Historical Provider, MD  oxycodone (OXY-IR) 5 MG capsule Take 5 mg by mouth every 6 (six) hours as needed for pain.   Yes Historical Provider, MD  polyethylene glycol (MIRALAX / GLYCOLAX) packet Take 17 g by mouth daily.   Yes Historical Provider, MD  potassium chloride (KLOR-CON) 10 MEQ CR tablet Take 10 mEq by mouth 2 (two) times daily.    Yes Historical Provider, MD  pregabalin (LYRICA) 50 MG capsule Take 50 mg by mouth 3 (three) times daily.   Yes Historical Provider, MD  promethazine (PHENERGAN) 25 MG tablet Take 25 mg by mouth every 6 (six) hours as needed for nausea.   Yes Historical Provider, MD  XIFAXAN 550 MG TABS TAKE 2 TABLETS BY MOUTH  ONCE DAILY. 12/08/10  Yes Nira Retort, NP  pantoprazole (PROTONIX) 40 MG tablet Take 1 tablet (40 mg total) by mouth daily at 12 noon. 04/28/11 04/27/12  Rhetta Mura, MD    Physical Exam: Blood pressure 115/40, pulse 87, temperature 97.4 F (36.3 C), temperature source Oral, resp. rate 16, SpO2 93.00%. General: Awake, Oriented to self, city, time, No acute distress. HEENT: EOMI, Moist mucous membranes Neck: Supple CV: S1 and S2 Lungs: Clear to ascultation bilaterally Abdomen: Soft, Nontender, Nondistended, +bowel sounds. Ext: Good pulses in lower extremities bilaterally.  1+ edema. No clubbing or cyanosis noted. Neuro: Cranial Nerves II-XII grossly intact. Has 5/5 motor strength in upper extremities.  Able to move her toes bilaterally.  Lab results:  Recent Labs  01/16/13 1752 01/16/13 1828  NA 144 148*  K 4.6 4.6  CL 115* 117*  CO2 20  --   GLUCOSE 105* 104*  BUN 32* 33*  CREATININE  1.66* 1.80*  CALCIUM 8.6  --     Recent Labs  01/16/13 1752  AST 46*  ALT 24  ALKPHOS 199*  BILITOT 1.1  PROT 6.7  ALBUMIN 2.9*   No results found for this basename: LIPASE, AMYLASE,  in the last 72 hours  Recent Labs  01/16/13 1752 01/16/13 1828  WBC 5.5  --   NEUTROABS 4.3  --   HGB 11.0* 11.2*  HCT 33.3* 33.0*  MCV 86.9  --   PLT 68*  --    No results found for this basename: CKTOTAL, CKMB, CKMBINDEX, TROPONINI,  in the last 72 hours No components found with this basename: POCBNP,  No results found for this basename: DDIMER,  in the last 72 hours No results found for this basename: HGBA1C,  in the last 72 hours No results found  for this basename: CHOL, HDL, LDLCALC, TRIG, CHOLHDL, LDLDIRECT,  in the last 72 hours No results found for this basename: TSH, T4TOTAL, FREET3, T3FREE, THYROIDAB,  in the last 72 hours No results found for this basename: VITAMINB12, FOLATE, FERRITIN, TIBC, IRON, RETICCTPCT,  in the last 72 hours Imaging results:  Dg Chest 1 View  01/16/2013   *RADIOLOGY REPORT*  Clinical Data: Fall  CHEST - 1 VIEW  Comparison: 02/01/2012  Findings: Single view of the chest was obtained.  No evidence for a pneumothorax.  Stable appearance of the heart and mediastinum. Streaky densities in the left upper lung could represent overlying bones and lung markings.  Otherwise, there is no focal airspace disease.  Degenerative changes in the thoracic spine.  IMPRESSION: Streaky densities in the left upper lung may represent overlying shadows and chronic changes.  Findings are nonspecific.   Original Report Authenticated By: Richarda Overlie, M.D.   Dg Hip Complete Left  01/16/2013   *RADIOLOGY REPORT*  Clinical Data: Fall and suspected hip fracture.  LEFT HIP - COMPLETE 2+ VIEW  Comparison: 04/18/2011 and 04/23/2011  Findings: There is a new fracture involving the left proximal femur.  Findings are most compatible with a subcapital fracture. The distal aspect is superiorly  displaced.  Again noted is internal fixation of the proximal right femur from previous fracture. Pelvic bony ring appears to be intact.  The left hip is located.  IMPRESSION: Displaced left femoral subcapital fracture.   Original Report Authenticated By: Richarda Overlie, M.D.   Dg Femur Right  01/16/2013   *RADIOLOGY REPORT*  Clinical Data: Fall and suspected right femur fracture.  RIGHT FEMUR - 2 VIEW  Comparison: 04/23/2011 and 01/28/2012  Findings: Dynamic hip screw in the proximal right femur.  There is irregularity of the right femoral neck and right femoral trochanters.  This is probably from the previous injury and appears unchanged from 01/28/2012.  There is a new displaced oblique fracture in the distal right femur.  Fracture appears be mildly comminuted.  Fracture is medially displaced.  The right hip is located.  Fracture is in the region of the distal diaphysis. Probable suprapatellar joint effusion.  IMPRESSION: Displaced comminuted fracture of the distal right femur.   Original Report Authenticated By: Richarda Overlie, M.D.   Other results: EKG: Sinus rhythm with LVH heart rate 82 unchanged from previous EKG.  Assessment & Plan by Problem: Mechanical fall resulting in displaced comminuted fracture of the distal right femur and displaced left femoral subcapital fracture Management as per orthopedic service.  Discussed with Dr. Carola Frost.  Patient due to her age has a high-risk during surgery.  Patient not endorsing any cardiac or respiratory symptoms at this time, recommend no further cardiac workup.  N.p.o. after midnight for possible surgery tomorrow.  Also planning on checking CT of right knee to fully evaluate distal femur fracture.  Patient will likely be nonweightbearing on right leg for 8 weeks and weight-bearing as tolerated on left leg for transfers postoperatively.  Bed rest for now.  Dementia Stable.  Patient is oriented to self, location, and time.  Patient was deemed to have capacity for  herself at this time. Continue donepezil.   Hypothyroidism Continue levothyroxine.  Diabetes Sliding scale insulin.  Acute renal failure Likely due to diuresis from furosemide.  Urine analysis pending.  Hold furosemide and potassium for now.  Reassess volume status daily.  Gently hydrate the patient with normal saline 50 cc per hour for 1 day.  History of cryptogenic cirrhosis  Stable.  Continue rifampin.  Check ammonia level in the morning.  Thrombocytopenia Likely due to cirrhosis.  Continue to monitor.  Mild anemia Likely due to chronic disease and due to the fracture.  Continue to monitor.  History of achalasia/GERD/history of aspiration pneumonia Chest x-ray shows nonspecific changes, no signs of pneumonia.  Continue to monitor.  Patient at risk for aspiration.  Continue PPI.  Anxiety/depression Stable.  History of proximal A. Fib Currently not on anticoagulation, likely due to fall risks.  Currently in sinus rhythm.  Rate controlled.  Prophylaxis SCDs.  No heparin due to thrombocytopenia and plan for surgery tomorrow.  CODE STATUS Full code.  This was discussed with the patient at the time of admission.  On my discussion with the patient, she was deemed to have capacity to make decisions for herself.  Reviewed patient's records from SNF, patient has a Gold out of facility DNR form filled (this was decided at [facility] admission in July-August of 2013 with the guardian).  It also appears that patient may be a ward of the state, will need to contact Barnie Alderman is patient's guardian social worker [phone] Ext [phone], in the morning to determine if patient can legally make decisions for herself.  Disposition Admit the patient to telemetry as inpatient.  Time spent on admission, talking to the patient, and coordinating care was: 60 mins.  Avyon Herendeen A, MD 01/16/2013, 8:15 PM

## 2013-01-16 NOTE — Progress Notes (Signed)
Orthopaedic Trauma Service Consult  Full eval and note to follow  79 y/o female s/p ground level fall earlier the afternoon.  Attempting to mobilize to a WC when the fall happened.  Pt had immediate onset of R leg pain and L hip pain. Pt brought to [facility] where she was found to have a R distal femur fx and L femoral neck fracture.  Pt with PMHx and Surg Hx of R hip fx. Pt has a DHS still in place to the R side.    Labs  Results for [name] (MRN [id number]) as of 01/16/2013 19:24  Ref. Range 01/16/2013 17:52  Sodium Latest Range: 135-145 mEq/L 144  Potassium Latest Range: 3.5-5.1 mEq/L 4.6  Chloride Latest Range: 96-112 mEq/L 115 (H)  CO2 Latest Range: 19-32 mEq/L 20  BUN Latest Range: 6-23 mg/dL 32 (H)  Creatinine Latest Range: 0.50-1.10 mg/dL 5.28 (H)  Calcium Latest Range: 8.4-10.5 mg/dL 8.6  GFR calc non Af Amer Latest Range: >90 mL/min 27 (L)  GFR calc Af Amer Latest Range: >90 mL/min 31 (L)  Glucose Latest Range: 70-99 mg/dL 413 (H)  Alkaline Phosphatase Latest Range: 39-117 U/L 199 (H)  Albumin Latest Range: 3.5-5.2 g/dL 2.9 (L)  AST Latest Range: 0-37 U/L 46 (H)  ALT Latest Range: 0-35 U/L 24  Total Protein Latest Range: 6.0-8.3 g/dL 6.7  Total Bilirubin Latest Range: 0.3-1.2 mg/dL 1.1  WBC Latest Range: 4.0-10.5 K/uL 5.5  RBC Latest Range: 3.87-5.11 MIL/uL 3.83 (L)  Hemoglobin Latest Range: 12.0-15.0 g/dL 24.4 (L)  HCT Latest Range: 36.0-46.0 % 33.3 (L)  MCV Latest Range: 78.0-100.0 fL 86.9  MCH Latest Range: 26.0-34.0 pg 28.7  MCHC Latest Range: 30.0-36.0 g/dL 01.0  RDW Latest Range: 11.5-15.5 % 16.6 (H)  Platelets Latest Range: 150-400 K/uL 68 (L)  Neutrophils Relative % Latest Range: 43-77 % 79 (H)  Lymphocytes Relative Latest Range: 12-46 % 11 (L)  Monocytes Relative Latest Range: 3-12 % 7  Eosinophils Relative Latest Range: 0-5 % 3  Basophils Relative Latest Range: 0-1 % 0    NEUT# Latest Range: 1.7-7.7 K/uL 4.3  Lymphocytes Absolute Latest  Range: 0.7-4.0 K/uL 0.6 (L)  Monocytes Absolute Latest Range: 0.1-1.0 K/uL 0.4  Eosinophils Absolute Latest Range: 0.0-0.7 K/uL 0.2  Basophils Absolute Latest Range: 0.0-0.1 K/uL 0.0  WBC Morphology No range found TOXIC GRANULATION  Prothrombin Time Latest Range: 11.6-15.2 seconds 15.6 (H)  INR Latest Range: 0.00-1.49  1.27  APTT Latest Range: 24-37 seconds 39 (H)    Imaging  R femur  Comminuted R distal femur fracture. No obvious extension to joint but images are suboptimal for this   [place] R IT femur fx. Stable DHS  Bones are osteopenic   L hip   Varus angulated L femoral neck fracture   Assessment and Plan  79 y/o female s/p ground level fall  1. Fall 2. R distal femur fracture and L femoral neck fracture  Strongly recommend operative intervention to facilitate mobilization and pain control even if pt is only bed to chair  Will check CT of R knee to fully eval distal femur fx.  Plan for either nail vs plate for fixation.  May need to remove some screws from [facility] regardless of implant selected  L femoral neck fracture will need hemiarthroplasty    Plan for OR tomorrow afternoon   Pt will be NWB on R leg x 8 weeks and WBAT on L leg for transfers post operatively   Continue bedrest for now  Ice to fx sites as needed for now  3. Medical issues per primary service  4. DVT/PE prophylaxis  Hold lovenox as pt is thrombocytopenic and we plan for OR tomorrow  Foot pumps or SCD's for now  5. FEN  NPO after MN  Will get ST eval post op give hx of esophogeal dysmotility  6. Dispo  Admit to medicine  Plan for OR tomorrow for R distal femur and L hip  CT of R knee  Mearl Latin, PA-C [facility] [phone] (P) 01/16/2013 7:36 PM

## 2013-01-16 NOTE — ED Provider Notes (Signed)
History    CSN: [id number] Arrival date & time 01/16/13  1549  First MD Initiated Contact with Patient 01/16/13 1603     Chief Complaint  Patient presents with  . Fall   (Consider location/radiation/quality/duration/timing/severity/associated sxs/prior Treatment) HPI Comments: Patient is an 79 year old woman who says that an aide at a nursing home was trying to move her to a wheelchair, the patient fell, and twisted her right leg. She has pain in the right leg as well as in the left hip. She had prior surgery on her right hip 04/26/2011 in [place]. She was given an oxycodone pill at her nursing home and sent to [facility] [place] via EMS.  Patient is a 79 y.o. female presenting with fall. The history is provided by the patient, the EMS personnel and medical records. No language interpreter was used.  Fall This is a new problem. The current episode started 3 to 5 hours ago. The problem occurs constantly. The problem has not changed since onset.Pertinent negatives include no chest pain, no abdominal pain, no headaches and no shortness of breath. Exacerbated by: Any movement. Treatments tried: Given a Percocet at her nursing home. The treatment provided mild relief.   Past Medical History  Diagnosis Date  . Cirrhosis 1992    Cryptogenic  . Esophageal motility disorder 2006    Nonspecific  . GERD (gastroesophageal reflux disease)   . Peripheral neuropathy   . Insomnia   . Dementia   . Hypothyroid   . Anxiety disorder   . Arthritis   . Hypertension   . Depression    Past Surgical History  Procedure Laterality Date  . Appendectomy    . Vesicovaginal fistula closure w/ tah    . Eye surgery    . Abdominal hysterectomy    . Orif hip fracture  04/23/2011    Procedure: OPEN REDUCTION INTERNAL FIXATION HIP;  Surgeon: Darreld Mclean;  Location: [facility];  Service: Orthopedics;  Laterality: Right;   History reviewed. No pertinent family history. History  Substance Use  Topics  . Smoking status: Never Smoker   . Smokeless tobacco: Never Used  . Alcohol Use: No   OB History   Grav Para Term Preterm Abortions TAB SAB Ect Mult Living                 Review of Systems  Constitutional: Negative.   HENT: Negative.   Eyes: Negative.   Respiratory: Negative.  Negative for shortness of breath.   Cardiovascular: Negative for chest pain.  Gastrointestinal: Negative.  Negative for abdominal pain.  Genitourinary: Negative.   Musculoskeletal:       Right thigh and left hip pain.  Skin: Negative.   Neurological: Negative.  Negative for headaches.  Psychiatric/Behavioral: Negative.     Allergies  Aspirin; Codeine; Morphine and related; Penicillins; Sulfonamide derivatives; and Zolpidem tartrate  Home Medications   Current Outpatient Rx  Name  Route  Sig  Dispense  Refill  . calcium carbonate (OS-CAL) 600 MG TABS   Oral   Take 600 mg by mouth 2 (two) times daily with a meal.           . donepezil (ARICEPT) 10 MG tablet   Oral   Take 10 mg by mouth at bedtime.           Marland Kitchen esomeprazole (NEXIUM) 40 MG capsule   Oral   Take 40 mg by mouth daily before breakfast. Med is expired but patient very seldom takes         .  furosemide (LASIX) 20 MG tablet   Oral   Take 20 mg by mouth daily. Takes at lunchtime         . EXPIRED: insulin aspart (NOVOLOG) 100 UNIT/ML injection   Subcutaneous   Inject 3 Units into the skin at bedtime.   10 mL   0   . EXPIRED: insulin aspart (NOVOLOG) 100 UNIT/ML injection   Subcutaneous   Inject 0-9 Units into the skin 3 (three) times daily with meals.   10 mL   0     Per Sensitivity Sliding scale   . lactulose (CEPHULAC) 10 G packet   Oral   Take 10 g by mouth 3 (three) times daily as needed. constipation         . levothyroxine (SYNTHROID, LEVOTHROID) 75 MCG tablet   Oral   Take 75 mcg by mouth daily.          . metoprolol (TOPROL-XL) 50 MG 24 hr tablet   Oral   Take 25 mg by mouth daily.            Marland Kitchen EXPIRED: pantoprazole (PROTONIX) 40 MG tablet   Oral   Take 1 tablet (40 mg total) by mouth daily at 12 noon.         . potassium chloride (KLOR-CON) 10 MEQ CR tablet   Oral   Take 10 mEq by mouth 2 (two) times daily.          . pregabalin (LYRICA) 100 MG capsule   Oral   Take 100 mg by mouth 3 (three) times daily.          . rivaroxaban (XARELTO) 10 MG TABS tablet   Oral   Take 1 tablet (10 mg total) by mouth daily.   12 tablet      . triazolam (HALCION) 0.25 MG tablet   Oral   Take 1 tablet (0.25 mg total) by mouth at bedtime as needed. Confusion and sleep aid   30 tablet   0   . XIFAXAN 550 MG TABS      TAKE 2 TABLETS BY MOUTH  ONCE DAILY.   62 tablet   5    BP 144/43  Pulse 80  Temp(Src) 97.4 F (36.3 C) (Oral)  Resp 16  SpO2 90% Physical Exam  Nursing note and vitals reviewed. Constitutional: She is oriented to person, place, and time.  Frail appearing elderly lady, in moderate to severe pain with pain in her right thigh and left hip.  HENT:  Head: Normocephalic and atraumatic.  Right Ear: External ear normal.  Left Ear: External ear normal.  Mouth/Throat: Oropharynx is clear and moist.  Eyes: Conjunctivae and EOM are normal. Pupils are equal, round, and reactive to light.  Neck: Normal range of motion. Neck supple.  Cardiovascular: Normal rate, regular rhythm and normal heart sounds.   Pulmonary/Chest: Effort normal and breath sounds normal.  Abdominal: Soft. Bowel sounds are normal.  Musculoskeletal:  She has swelling and tenderness over the distal shaft of her right lower leg just above the knee. She has tenderness over the left greater trochanter. Left leg is not shorter left leg externally rotated. She has intact sensation and motor function in her feet.  Neurological: She is alert and oriented to person, place, and time.  No sensory or motor deficit.  Skin: Skin is warm and dry.  Psychiatric: She has a normal mood and affect. Her  behavior is normal.    ED Course  Procedures (including critical care time)  4:08 PM  Date: 01/16/2013  Rate: 81  Rhythm: normal sinus rhythm  QRS Axis: left  Intervals: normal QRS:  Left ventricular hypertrophy  ST/T Wave abnormalities: normal  Conduction Disutrbances:none  Narrative Interpretation: Abnormal EKG  Old EKG Reviewed: unchanged  4:30 PM Pt was seen and had physical examination.  Lab workup was ordered.  IV fluids, IV Fentanyl was ordered.  5:38 PM Has comminuted right distal tibia and fibula fracture as well as a left femoral neck fracture.  Lab workup is not back, checking on that to see where that stands.  5:57 PM Case discussed with Dr. Carola Frost, orthopedics.  He will consult on pt.    7:20 PM Results for orders placed during the hospital encounter of 01/16/13  CBC WITH DIFFERENTIAL      Result Value Range   WBC 5.5  4.0 - 10.5 K/uL   RBC 3.83 (*) 3.87 - 5.11 MIL/uL   Hemoglobin 11.0 (*) 12.0 - 15.0 g/dL   HCT 11.9 (*) 14.7 - 82.9 %   MCV 86.9  78.0 - 100.0 fL   MCH 28.7  26.0 - 34.0 pg   MCHC 33.0  30.0 - 36.0 g/dL   RDW 56.2 (*) 13.0 - 86.5 %   Platelets 68 (*) 150 - 400 K/uL   Neutrophils Relative % 79 (*) 43 - 77 %   Lymphocytes Relative 11 (*) 12 - 46 %   Monocytes Relative 7  3 - 12 %   Eosinophils Relative 3  0 - 5 %   Basophils Relative 0  0 - 1 %   Neutro Abs 4.3  1.7 - 7.7 K/uL   Lymphs Abs 0.6 (*) 0.7 - 4.0 K/uL   Monocytes Absolute 0.4  0.1 - 1.0 K/uL   Eosinophils Absolute 0.2  0.0 - 0.7 K/uL   Basophils Absolute 0.0  0.0 - 0.1 K/uL   WBC Morphology TOXIC GRANULATION    PROTIME-INR      Result Value Range   Prothrombin Time 15.6 (*) 11.6 - 15.2 seconds   INR 1.27  0.00 - 1.49  APTT      Result Value Range   aPTT 39 (*) 24 - 37 seconds  POCT I-STAT, CHEM 8      Result Value Range   Sodium 148 (*) 135 - 145 mEq/L   Potassium 4.6  3.5 - 5.1 mEq/L   Chloride 117 (*) 96 - 112 mEq/L   BUN 33 (*) 6 - 23 mg/dL   Creatinine, Ser 7.84  (*) 0.50 - 1.10 mg/dL   Glucose, Bld 696 (*) 70 - 99 mg/dL   Calcium, Ion 2.95  2.84 - 1.30 mmol/L   TCO2 18  0 - 100 mmol/L   Hemoglobin 11.2 (*) 12.0 - 15.0 g/dL   HCT 13.2 (*) 44.0 - 10.2 %  SAMPLE TO BLOOD BANK      Result Value Range   Blood Bank Specimen SAMPLE AVAILABLE FOR TESTING     Sample Expiration 01/17/2013     Dg Chest 1 View  01/16/2013   *RADIOLOGY REPORT*  Clinical Data: Fall  CHEST - 1 VIEW  Comparison: 02/01/2012  Findings: Single view of the chest was obtained.  No evidence for a pneumothorax.  Stable appearance of the heart and mediastinum. Streaky densities in the left upper lung could represent overlying bones and lung markings.  Otherwise, there is no focal airspace disease.  Degenerative changes in the thoracic spine.  IMPRESSION: Streaky densities in the left upper lung  may represent overlying shadows and chronic changes.  Findings are nonspecific.   Original Report Authenticated By: Richarda Overlie, M.D.   Dg Hip Complete Left  01/16/2013   *RADIOLOGY REPORT*  Clinical Data: Fall and suspected hip fracture.  LEFT HIP - COMPLETE 2+ VIEW  Comparison: 04/18/2011 and 04/23/2011  Findings: There is a new fracture involving the left proximal femur.  Findings are most compatible with a subcapital fracture. The distal aspect is superiorly displaced.  Again noted is internal fixation of the proximal right femur from previous fracture. Pelvic bony ring appears to be intact.  The left hip is located.  IMPRESSION: Displaced left femoral subcapital fracture.   Original Report Authenticated By: Richarda Overlie, M.D.   Dg Femur Right  01/16/2013   *RADIOLOGY REPORT*  Clinical Data: Fall and suspected right femur fracture.  RIGHT FEMUR - 2 VIEW  Comparison: 04/23/2011 and 01/28/2012  Findings: Dynamic hip screw in the proximal right femur.  There is irregularity of the right femoral neck and right femoral trochanters.  This is probably from the previous injury and appears unchanged from 01/28/2012.   There is a new displaced oblique fracture in the distal right femur.  Fracture appears be mildly comminuted.  Fracture is medially displaced.  The right hip is located.  Fracture is in the region of the distal diaphysis. Probable suprapatellar joint effusion.  IMPRESSION: Displaced comminuted fracture of the distal right femur.   Original Report Authenticated By: Richarda Overlie, M.D.    7:20 PM Lab results back.  Call to [facility] to admit pt.  R knee immobilizer ordered.    7:33 PM Case discussed with Dr. Betti Cruz, who will admit her to a telemetry unit.  Temporary admission orders entered.   1. Fall at nursing home, initial encounter   2. Fracture of right femur, closed, initial encounter   3. Displaced fracture of left femoral neck, closed, initial encounter       Carleene Cooper III, MD 01/16/13 [phone]

## 2013-01-16 NOTE — ED Notes (Signed)
Pt from [facility] in [place].  Pt had a fall at the facility.  Per EMS pt's aide fell and the pt fell on top of her.  Pt has a hx of right femur fracture.  Pt reporting pain to right leg as well as left leg today.  Pt hypertensive with EMS 158/59.  Pt denies hitting head or LOC, moving all extremities with EMS.  No obvious deformity noted.  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  Pt given 5 mg of oxycodone at facility prior to transport. Pt alert and oriented x 4.

## 2013-01-17 ENCOUNTER — Inpatient Hospital Stay (HOSPITAL_COMMUNITY): Payer: PRIVATE HEALTH INSURANCE

## 2013-01-17 ENCOUNTER — Encounter (HOSPITAL_COMMUNITY): Payer: Self-pay | Admitting: *Deleted

## 2013-01-17 ENCOUNTER — Encounter (HOSPITAL_COMMUNITY)
Admission: EM | Disposition: A | Discharge status: Skilled Nursing Facility | Payer: Self-pay | Source: Home / Self Care | Attending: Internal Medicine

## 2013-01-17 ENCOUNTER — Inpatient Hospital Stay (HOSPITAL_COMMUNITY): Payer: PRIVATE HEALTH INSURANCE | Admitting: Anesthesiology

## 2013-01-17 ENCOUNTER — Encounter (HOSPITAL_COMMUNITY): Payer: Self-pay | Admitting: Anesthesiology

## 2013-01-17 DIAGNOSIS — G9340 Encephalopathy, unspecified: Secondary | ICD-10-CM | POA: Diagnosis not present

## 2013-01-17 DIAGNOSIS — D696 Thrombocytopenia, unspecified: Secondary | ICD-10-CM

## 2013-01-17 DIAGNOSIS — N390 Urinary tract infection, site not specified: Secondary | ICD-10-CM | POA: Diagnosis present

## 2013-01-17 DIAGNOSIS — D649 Anemia, unspecified: Secondary | ICD-10-CM

## 2013-01-17 DIAGNOSIS — K7460 Unspecified cirrhosis of liver: Secondary | ICD-10-CM

## 2013-01-17 DIAGNOSIS — N179 Acute kidney failure, unspecified: Secondary | ICD-10-CM

## 2013-01-17 DIAGNOSIS — J9600 Acute respiratory failure, unspecified whether with hypoxia or hypercapnia: Secondary | ICD-10-CM

## 2013-01-17 DIAGNOSIS — B9620 Unspecified Escherichia coli [E. coli] as the cause of diseases classified elsewhere: Secondary | ICD-10-CM | POA: Diagnosis present

## 2013-01-17 HISTORY — PX: HARDWARE REMOVAL: SHX979

## 2013-01-17 HISTORY — PX: HIP ARTHROPLASTY: SHX981

## 2013-01-17 HISTORY — PX: FEMUR IM NAIL: SHX1597

## 2013-01-17 LAB — COMPREHENSIVE METABOLIC PANEL
BUN: 30 mg/dL — ABNORMAL HIGH (ref 6–23)
CO2: 18 mEq/L — ABNORMAL LOW (ref 19–32)
Chloride: 116 mEq/L — ABNORMAL HIGH (ref 96–112)
Creatinine, Ser: 1.25 mg/dL — ABNORMAL HIGH (ref 0.50–1.10)
GFR calc non Af Amer: 38 mL/min — ABNORMAL LOW (ref >90)
Total Bilirubin: 1.7 mg/dL — ABNORMAL HIGH (ref 0.3–1.2)

## 2013-01-17 LAB — BLOOD GAS, ARTERIAL
Acid-base deficit: 9.6 mmol/L — ABNORMAL HIGH (ref 0.0–2.0)
Bicarbonate: 16 mEq/L — ABNORMAL LOW (ref 20.0–24.0)
Patient temperature: 98.6
TCO2: 17.1 mmol/L (ref 0–100)
pCO2 arterial: 36.5 mmHg (ref 35.0–45.0)
pH, Arterial: 7.264 — ABNORMAL LOW (ref 7.350–7.450)

## 2013-01-17 LAB — BASIC METABOLIC PANEL
CO2: 18 mEq/L — ABNORMAL LOW (ref 19–32)
Chloride: 115 mEq/L — ABNORMAL HIGH (ref 96–112)
Glucose, Bld: 112 mg/dL — ABNORMAL HIGH (ref 70–99)
Sodium: 143 mEq/L (ref 135–145)

## 2013-01-17 LAB — SURGICAL PCR SCREEN
MRSA, PCR: NEGATIVE
Staphylococcus aureus: NEGATIVE

## 2013-01-17 LAB — PROTIME-INR
INR: 1.37 (ref 0.00–1.49)
Prothrombin Time: 16.5 seconds — ABNORMAL HIGH (ref 11.6–15.2)

## 2013-01-17 LAB — HEMOGLOBIN A1C: Mean Plasma Glucose: 111 mg/dL (ref <117)

## 2013-01-17 LAB — GLUCOSE, CAPILLARY: Glucose-Capillary: 104 mg/dL — ABNORMAL HIGH (ref 70–99)

## 2013-01-17 LAB — URINALYSIS, ROUTINE W REFLEX MICROSCOPIC
Bilirubin Urine: NEGATIVE
Glucose, UA: NEGATIVE mg/dL
Hgb urine dipstick: NEGATIVE
Ketones, ur: NEGATIVE mg/dL
Protein, ur: NEGATIVE mg/dL

## 2013-01-17 LAB — ABO/RH: ABO/RH(D): O POS

## 2013-01-17 LAB — CBC
HCT: 27.9 % — ABNORMAL LOW (ref 36.0–46.0)
Hemoglobin: 10.3 g/dL — ABNORMAL LOW (ref 12.0–15.0)
MCH: 28.5 pg (ref 26.0–34.0)
MCV: 87.5 fL (ref 78.0–100.0)
MCV: 86.9 fL (ref 78.0–100.0)
Platelets: 58 10*3/uL — ABNORMAL LOW (ref 150–400)
Platelets: 44 10*3/uL — ABNORMAL LOW (ref 150–400)
RBC: 3.61 MIL/uL — ABNORMAL LOW (ref 3.87–5.11)
RBC: 3.21 MIL/uL — ABNORMAL LOW (ref 3.87–5.11)
RDW: 16.3 % — ABNORMAL HIGH (ref 11.5–15.5)
WBC: 6.5 10*3/uL (ref 4.0–10.5)
WBC: 10 10*3/uL (ref 4.0–10.5)

## 2013-01-17 LAB — URINE MICROSCOPIC-ADD ON

## 2013-01-17 LAB — PREPARE RBC (CROSSMATCH)

## 2013-01-17 LAB — POCT I-STAT 4, (NA,K, GLUC, HGB,HCT)
Hemoglobin: 8.5 g/dL — ABNORMAL LOW (ref 12.0–15.0)
Potassium: 4.6 mEq/L (ref 3.5–5.1)

## 2013-01-17 SURGERY — INSERTION, INTRAMEDULLARY ROD, FEMUR, RETROGRADE
Anesthesia: General | Site: Leg Upper | Laterality: Right | Wound class: Clean

## 2013-01-17 MED ORDER — MIDAZOLAM HCL 2 MG/2ML IJ SOLN: 0.5000 mg | Freq: Once | INTRAMUSCULAR | Status: DC | PRN

## 2013-01-17 MED ORDER — ACETAMINOPHEN 650 MG RE SUPP: 650.0000 mg | Freq: Four times a day (QID) | RECTAL | Status: DC | PRN

## 2013-01-17 MED ORDER — LACTATED RINGERS IV SOLN
INTRAVENOUS | Status: DC | PRN
  Administered 2013-01-17: 16:00:00 via INTRAVENOUS

## 2013-01-17 MED ORDER — PROPOFOL 10 MG/ML IV BOLUS
INTRAVENOUS | Status: DC | PRN
  Administered 2013-01-17: 100 mg via INTRAVENOUS

## 2013-01-17 MED ORDER — SODIUM CHLORIDE 0.9 % IV SOLN
INTRAVENOUS | Status: DC
  Administered 2013-01-18: 12:00:00 via INTRAVENOUS

## 2013-01-17 MED ORDER — FENTANYL CITRATE 0.05 MG/ML IJ SOLN
50.0000 ug | INTRAMUSCULAR | Status: DC | PRN
  Administered 2013-01-17: 100 ug via INTRAVENOUS

## 2013-01-17 MED ORDER — MENTHOL 3 MG MT LOZG
1.0000 | LOZENGE | OROMUCOSAL | Status: DC | PRN
  Filled 2013-01-17: qty 9

## 2013-01-17 MED ORDER — HYDROMORPHONE HCL PF 1 MG/ML IJ SOLN: 0.2500 mg | INTRAMUSCULAR | Status: DC | PRN

## 2013-01-17 MED ORDER — FENTANYL CITRATE 0.05 MG/ML IJ SOLN
25.0000 ug/h | INTRAMUSCULAR | Status: DC
  Administered 2013-01-18: 25 ug/h via INTRAVENOUS
  Filled 2013-01-17: qty 50

## 2013-01-17 MED ORDER — ALBUMIN HUMAN 5 % IV SOLN
INTRAVENOUS | Status: AC
  Administered 2013-01-17: 12.5 g via INTRAVENOUS
  Filled 2013-01-17: qty 250

## 2013-01-17 MED ORDER — CHLORHEXIDINE GLUCONATE 0.12 % MT SOLN
15.0000 mL | Freq: Two times a day (BID) | OROMUCOSAL | Status: DC
  Administered 2013-01-18 – 2013-01-30 (×26): 15 mL via OROMUCOSAL
  Filled 2013-01-17 (×29): qty 15

## 2013-01-17 MED ORDER — FENTANYL CITRATE 0.05 MG/ML IJ SOLN
INTRAMUSCULAR | Status: DC | PRN
  Administered 2013-01-17 (×4): 50 ug via INTRAVENOUS

## 2013-01-17 MED ORDER — SODIUM CHLORIDE 0.9 % IV SOLN
INTRAVENOUS | Status: DC | PRN
  Administered 2013-01-17 (×3): via INTRAVENOUS

## 2013-01-17 MED ORDER — METOCLOPRAMIDE HCL 5 MG/ML IJ SOLN
5.0000 mg | Freq: Three times a day (TID) | INTRAMUSCULAR | Status: DC | PRN
  Filled 2013-01-17: qty 2

## 2013-01-17 MED ORDER — ONDANSETRON HCL 4 MG/2ML IJ SOLN: 4.0000 mg | Freq: Four times a day (QID) | INTRAMUSCULAR | Status: DC | PRN

## 2013-01-17 MED ORDER — BIOTENE DRY MOUTH MT LIQD
1.0000 "application " | Freq: Four times a day (QID) | OROMUCOSAL | Status: DC
  Administered 2013-01-18 – 2013-01-23 (×17): 15 mL via OROMUCOSAL

## 2013-01-17 MED ORDER — HYDROMORPHONE HCL PF 1 MG/ML IJ SOLN
0.5000 mg | INTRAMUSCULAR | Status: DC | PRN
  Administered 2013-01-17: 0.5 mg via INTRAVENOUS

## 2013-01-17 MED ORDER — HYDROMORPHONE HCL PF 1 MG/ML IJ SOLN: 0.5000 mg | INTRAMUSCULAR | Status: DC | PRN

## 2013-01-17 MED ORDER — MEPERIDINE HCL 25 MG/ML IJ SOLN: 6.2500 mg | INTRAMUSCULAR | Status: DC | PRN

## 2013-01-17 MED ORDER — METOCLOPRAMIDE HCL 5 MG PO TABS
5.0000 mg | ORAL_TABLET | Freq: Three times a day (TID) | ORAL | Status: DC | PRN
  Filled 2013-01-17: qty 2

## 2013-01-17 MED ORDER — ONDANSETRON HCL 4 MG/2ML IJ SOLN
INTRAMUSCULAR | Status: DC | PRN
  Administered 2013-01-17: 4 mg via INTRAVENOUS

## 2013-01-17 MED ORDER — DOCUSATE SODIUM 100 MG PO CAPS: 100.0000 mg | ORAL_CAPSULE | Freq: Two times a day (BID) | ORAL | Status: DC

## 2013-01-17 MED ORDER — LEVOFLOXACIN IN D5W 500 MG/100ML IV SOLN
500.0000 mg | INTRAVENOUS | Status: DC
Start: 2013-01-17 — End: 2013-01-18
  Administered 2013-01-17: 500 mg via INTRAVENOUS
  Filled 2013-01-17: qty 100

## 2013-01-17 MED ORDER — ONDANSETRON HCL 4 MG PO TABS: 4.0000 mg | ORAL_TABLET | Freq: Four times a day (QID) | ORAL | Status: DC | PRN

## 2013-01-17 MED ORDER — PHENOL 1.4 % MT LIQD: 1.0000 | OROMUCOSAL | Status: DC | PRN

## 2013-01-17 MED ORDER — VANCOMYCIN HCL IN DEXTROSE 1-5 GM/200ML-% IV SOLN
1000.0000 mg | INTRAVENOUS | Status: AC
  Administered 2013-01-17: 1000 mg via INTRAVENOUS
  Filled 2013-01-17: qty 200

## 2013-01-17 MED ORDER — GLYCOPYRROLATE 0.2 MG/ML IJ SOLN
INTRAMUSCULAR | Status: DC | PRN
  Administered 2013-01-17: 0.6 mg via INTRAVENOUS

## 2013-01-17 MED ORDER — PROMETHAZINE HCL 25 MG/ML IJ SOLN: 6.2500 mg | INTRAMUSCULAR | Status: DC | PRN

## 2013-01-17 MED ORDER — NEOSTIGMINE METHYLSULFATE 1 MG/ML IJ SOLN
INTRAMUSCULAR | Status: DC | PRN
  Administered 2013-01-17: 4 mg via INTRAVENOUS

## 2013-01-17 MED ORDER — VANCOMYCIN HCL IN DEXTROSE 1-5 GM/200ML-% IV SOLN
1000.0000 mg | Freq: Two times a day (BID) | INTRAVENOUS | Status: AC
  Administered 2013-01-18: 1000 mg via INTRAVENOUS
  Filled 2013-01-17: qty 200

## 2013-01-17 MED ORDER — PROPOFOL 10 MG/ML IV EMUL
INTRAVENOUS | Status: AC
  Administered 2013-01-17: 1000 mg
  Filled 2013-01-17: qty 100

## 2013-01-17 MED ORDER — INSULIN ASPART 100 UNIT/ML ~~LOC~~ SOLN: 0.0000 [IU] | SUBCUTANEOUS | Status: DC

## 2013-01-17 MED ORDER — OXYCODONE HCL 5 MG PO TABS: 5.0000 mg | ORAL_TABLET | Freq: Once | ORAL | Status: DC | PRN

## 2013-01-17 MED ORDER — VANCOMYCIN HCL IN DEXTROSE 1-5 GM/200ML-% IV SOLN
INTRAVENOUS | Status: AC
  Filled 2013-01-17: qty 200

## 2013-01-17 MED ORDER — OXYCODONE HCL 5 MG/5ML PO SOLN: 5.0000 mg | Freq: Once | ORAL | Status: DC | PRN

## 2013-01-17 MED ORDER — HYDROMORPHONE HCL PF 1 MG/ML IJ SOLN
INTRAMUSCULAR | Status: AC
  Filled 2013-01-17: qty 1

## 2013-01-17 MED ORDER — SODIUM CHLORIDE 0.9 % IR SOLN
Status: DC | PRN
  Administered 2013-01-17 (×2): 1000 mL

## 2013-01-17 MED ORDER — ACETAMINOPHEN 325 MG PO TABS
650.0000 mg | ORAL_TABLET | Freq: Four times a day (QID) | ORAL | Status: DC | PRN
  Administered 2013-01-20 – 2013-01-30 (×5): 650 mg via ORAL
  Filled 2013-01-17 (×5): qty 2

## 2013-01-17 MED ORDER — PHENYLEPHRINE HCL 10 MG/ML IJ SOLN
10.0000 mg | INTRAMUSCULAR | Status: DC | PRN
  Administered 2013-01-17: 50 ug/min via INTRAVENOUS

## 2013-01-17 MED ORDER — MIDAZOLAM HCL 2 MG/2ML IJ SOLN
INTRAMUSCULAR | Status: AC
  Filled 2013-01-17: qty 2

## 2013-01-17 MED ORDER — ROCURONIUM BROMIDE 100 MG/10ML IV SOLN
INTRAVENOUS | Status: DC | PRN
  Administered 2013-01-17: 50 mg via INTRAVENOUS

## 2013-01-17 MED ORDER — VECURONIUM BROMIDE 10 MG IV SOLR
INTRAVENOUS | Status: DC | PRN
  Administered 2013-01-17 (×2): 1 mg via INTRAVENOUS
  Administered 2013-01-17: 3 mg via INTRAVENOUS

## 2013-01-17 SURGICAL SUPPLY — 115 items
BANDAGE ELASTIC 4 VELCRO ST LF (GAUZE/BANDAGES/DRESSINGS) ×2 IMPLANT
BANDAGE ELASTIC 6 VELCRO ST LF (GAUZE/BANDAGES/DRESSINGS) ×2 IMPLANT
BANDAGE ESMARK 6X9 LF (GAUZE/BANDAGES/DRESSINGS) ×4 IMPLANT
BANDAGE GAUZE ELAST BULKY 4 IN (GAUZE/BANDAGES/DRESSINGS) ×4 IMPLANT
BIT DRILL CALIBRATED 4.3MMX365 (DRILL) ×2 IMPLANT
BIT DRILL CROWE PNT TWST 4.5MM (DRILL) ×1 IMPLANT
BLADE SAW SAG 73X25 THK (BLADE)
BLADE SAW SGTL 73X25 THK (BLADE) ×2 IMPLANT
BNDG CMPR 9X6 STRL LF SNTH (GAUZE/BANDAGES/DRESSINGS) ×4
BNDG COHESIVE 4X5 TAN STRL (GAUZE/BANDAGES/DRESSINGS) ×5 IMPLANT
BNDG COHESIVE 6X5 TAN STRL LF (GAUZE/BANDAGES/DRESSINGS) ×3 IMPLANT
BNDG ESMARK 6X9 LF (GAUZE/BANDAGES/DRESSINGS) ×5
BRUSH FEMORAL CANAL (MISCELLANEOUS) IMPLANT
BRUSH SCRUB DISP (MISCELLANEOUS) ×14 IMPLANT
CAPT HIP FX BIPOLAR/UNIPOLAR ×3 IMPLANT
CEMENT BONE DEPUY (Cement) ×4 IMPLANT
CLEANER TIP ELECTROSURG 2X2 (MISCELLANEOUS) ×5 IMPLANT
CLOTH BEACON ORANGE TIMEOUT ST (SAFETY) ×5 IMPLANT
COVER BACK TABLE 24X17X13 BIG (DRAPES) IMPLANT
COVER MAYO STAND STRL (DRAPES) ×5 IMPLANT
COVER SURGICAL LIGHT HANDLE (MISCELLANEOUS) ×7 IMPLANT
CUFF TOURNIQUET SINGLE 18IN (TOURNIQUET CUFF) IMPLANT
CUFF TOURNIQUET SINGLE 24IN (TOURNIQUET CUFF) IMPLANT
CUFF TOURNIQUET SINGLE 34IN LL (TOURNIQUET CUFF) IMPLANT
DRAPE C-ARM 42X72 X-RAY (DRAPES) ×5 IMPLANT
DRAPE C-ARMOR (DRAPES) ×5 IMPLANT
DRAPE INCISE IOBAN 66X45 STRL (DRAPES) ×5 IMPLANT
DRAPE INCISE IOBAN 85X60 (DRAPES) ×8 IMPLANT
DRAPE OEC MINIVIEW 54X84 (DRAPES) ×2 IMPLANT
DRAPE ORTHO SPLIT 77X108 STRL (DRAPES) ×20
DRAPE SURG ORHT 6 SPLT 77X108 (DRAPES) ×10 IMPLANT
DRAPE U-SHAPE 47X51 STRL (DRAPES) ×9 IMPLANT
DRILL BIT 7/64X5 (BIT) ×3 IMPLANT
DRILL CALIBRATED 4.3MMX365 (DRILL) ×5
DRILL CROWE POINT TWIST 4.5MM (DRILL) ×5
DRSG ADAPTIC 3X8 NADH LF (GAUZE/BANDAGES/DRESSINGS) ×3 IMPLANT
DRSG MEPILEX BORDER 4X12 (GAUZE/BANDAGES/DRESSINGS) ×2 IMPLANT
DRSG PAD ABDOMINAL 8X10 ST (GAUZE/BANDAGES/DRESSINGS) ×5 IMPLANT
ELECT BLADE 6.5 EXT (BLADE) ×3 IMPLANT
ELECT CAUTERY BLADE 6.4 (BLADE) ×2 IMPLANT
ELECT REM PT RETURN 9FT ADLT (ELECTROSURGICAL) ×10
ELECTRODE REM PT RTRN 9FT ADLT (ELECTROSURGICAL) ×6 IMPLANT
EVACUATOR 1/8 PVC DRAIN (DRAIN) IMPLANT
GAUZE XEROFORM 1X8 LF (GAUZE/BANDAGES/DRESSINGS) ×3 IMPLANT
GLOVE BIO SURGEON STRL SZ7.5 (GLOVE) ×7 IMPLANT
GLOVE BIO SURGEON STRL SZ8 (GLOVE) ×7 IMPLANT
GLOVE BIOGEL PI IND STRL 6.5 (GLOVE) ×2 IMPLANT
GLOVE BIOGEL PI IND STRL 7.5 (GLOVE) ×5 IMPLANT
GLOVE BIOGEL PI IND STRL 8 (GLOVE) ×6 IMPLANT
GLOVE BIOGEL PI INDICATOR 6.5 (GLOVE) ×2
GLOVE BIOGEL PI INDICATOR 7.5 (GLOVE) ×2
GLOVE BIOGEL PI INDICATOR 8 (GLOVE) ×2
GLOVE SURG SS PI 6.0 STRL IVOR (GLOVE) ×6 IMPLANT
GOWN PREVENTION PLUS XLARGE (GOWN DISPOSABLE) ×9 IMPLANT
GOWN PREVENTION PLUS XXLARGE (GOWN DISPOSABLE) IMPLANT
GOWN STRL NON-REIN LRG LVL3 (GOWN DISPOSABLE) ×18 IMPLANT
GUIDEPIN 3.2X17.5 THRD DISP (PIN) ×2 IMPLANT
GUIDEWIRE BEAD TIP (WIRE) ×2 IMPLANT
HANDPIECE INTERPULSE COAX TIP (DISPOSABLE)
IMMOBILIZER KNEE 20 (SOFTGOODS)
IMMOBILIZER KNEE 20 THIGH 36 (SOFTGOODS) IMPLANT
IMMOBILIZER KNEE 22 UNIV (SOFTGOODS) IMPLANT
IMMOBILIZER KNEE 24 THIGH 36 (MISCELLANEOUS) IMPLANT
IMMOBILIZER KNEE 24 UNIV (MISCELLANEOUS)
KIT BASIN OR (CUSTOM PROCEDURE TRAY) ×8 IMPLANT
KIT ROOM TURNOVER OR (KITS) ×5 IMPLANT
MANIFOLD NEPTUNE II (INSTRUMENTS) ×5 IMPLANT
NAIL FEM RETRO 12X340 (Nail) ×2 IMPLANT
NDL 1/2 CIR MAYO (NEEDLE) IMPLANT
NEEDLE 1/2 CIR MAYO (NEEDLE) ×5 IMPLANT
NEEDLE 22X1 1/2 (OR ONLY) (NEEDLE) IMPLANT
NS IRRIG 1000ML POUR BTL (IV SOLUTION) ×7 IMPLANT
PACK ORTHO EXTREMITY (CUSTOM PROCEDURE TRAY) ×5 IMPLANT
PACK TOTAL JOINT (CUSTOM PROCEDURE TRAY) ×5 IMPLANT
PAD ARMBOARD 7.5X6 YLW CONV (MISCELLANEOUS) ×10 IMPLANT
PADDING CAST COTTON 6X4 STRL (CAST SUPPLIES) ×6 IMPLANT
PASSER SUT SWANSON 36MM LOOP (INSTRUMENTS) ×2 IMPLANT
PRESSURIZER FEMORAL UNIV (MISCELLANEOUS) IMPLANT
SCREW CORT TI DBL LEAD 5X32 (Screw) ×4 IMPLANT
SCREW CORT TI DBL LEAD 5X44 (Screw) ×3 IMPLANT
SCREW CORT TI DBL LEAD 5X48 (Screw) ×3 IMPLANT
SCREW CORT TI DBL LEAD 5X65 (Screw) ×2 IMPLANT
SCREW CORT TI DBL LEAD 5X75 (Screw) ×3 IMPLANT
SCREW CORT TI DBLE LEAD 5X28 (Screw) ×4 IMPLANT
SET HNDPC FAN SPRY TIP SCT (DISPOSABLE) IMPLANT
SPONGE GAUZE 4X4 12PLY (GAUZE/BANDAGES/DRESSINGS) ×5 IMPLANT
SPONGE LAP 18X18 X RAY DECT (DISPOSABLE) ×7 IMPLANT
SPONGE SCRUB IODOPHOR (GAUZE/BANDAGES/DRESSINGS) ×5 IMPLANT
STAPLER VISISTAT 35W (STAPLE) ×9 IMPLANT
STOCKINETTE IMPERVIOUS LG (DRAPES) ×5 IMPLANT
STRIP CLOSURE SKIN 1/2X4 (GAUZE/BANDAGES/DRESSINGS) IMPLANT
SUCTION FRAZIER TIP 10 FR DISP (SUCTIONS) ×5 IMPLANT
SUT ETHILON 3 0 PS 1 (SUTURE) ×2 IMPLANT
SUT FIBERWIRE #2 38 T-5 BLUE (SUTURE) ×10
SUT PDS AB 2-0 CT1 27 (SUTURE) IMPLANT
SUT PROLENE 3 0 PS 2 (SUTURE) IMPLANT
SUT VIC AB 0 CT1 27 (SUTURE) ×10
SUT VIC AB 0 CT1 27XBRD ANBCTR (SUTURE) ×2 IMPLANT
SUT VIC AB 1 CT1 27 (SUTURE) ×10
SUT VIC AB 1 CT1 27XBRD ANBCTR (SUTURE) ×8 IMPLANT
SUT VIC AB 1 CTX 18 (SUTURE) ×2 IMPLANT
SUT VIC AB 2-0 CT1 27 (SUTURE) ×10
SUT VIC AB 2-0 CT1 TAPERPNT 27 (SUTURE) ×8 IMPLANT
SUT VIC AB 2-0 CT3 27 (SUTURE) ×4 IMPLANT
SUTURE FIBERWR #2 38 T-5 BLUE (SUTURE) ×8 IMPLANT
SYR CONTROL 10ML LL (SYRINGE) IMPLANT
TAPE CLOTH SURG 6X10 WHT LF (GAUZE/BANDAGES/DRESSINGS) ×3 IMPLANT
TOWEL OR 17X24 6PK STRL BLUE (TOWEL DISPOSABLE) ×7 IMPLANT
TOWEL OR 17X26 10 PK STRL BLUE (TOWEL DISPOSABLE) ×12 IMPLANT
TOWER CARTRIDGE SMART MIX (DISPOSABLE) IMPLANT
TRAY FOLEY CATH 16FRSI W/METER (SET/KITS/TRAYS/PACK) IMPLANT
TUBE CONNECTING 12X1/4 (SUCTIONS) ×5 IMPLANT
UNDERPAD 30X30 INCONTINENT (UNDERPADS AND DIAPERS) ×3 IMPLANT
WATER STERILE IRR 1000ML POUR (IV SOLUTION) ×15 IMPLANT
YANKAUER SUCT BULB TIP NO VENT (SUCTIONS) ×5 IMPLANT

## 2013-01-17 NOTE — Anesthesia Postprocedure Evaluation (Signed)
  Anesthesia Post-op Note  Patient: [name]  Procedure(s) Performed: Procedure(s): INTRAMEDULLARY (IM) RETROGRADE FEMORAL NAILING (Right) HARDWARE REMOVAL OF RIGHT DHS (Right) ARTHROPLASTY BIPOLAR HIP (Left)  Patient Location: PACU  Anesthesia Type:General  Level of Consciousness: sedated, unresponsive and obtunded  Airway and Oxygen Therapy: Patient remains intubated per anesthesia plan and Patient placed on Ventilator (see vital sign flow sheet for setting)  Post-op Pain: none  Post-op Assessment: Post-op Vital signs reviewed, PATIENT'S CARDIOVASCULAR STATUS UNSTABLE and Respiratory Function Stable  Post-op Vital Signs: stable  Complications: No apparent anesthesia complications

## 2013-01-17 NOTE — Progress Notes (Signed)
Orthopedic Tech Progress Note Patient Details:  [name] 16-Jan-1923 [id number] Spoke with family; patient not appropriate for OHF at this time due to physical condition. Patient is not strong enough to use frame.  Patient ID: [name], female   DOB: [birth year], 79 y.o.   MRN: [id number]   Orie Rout 01/17/2013, 12:44 PM

## 2013-01-17 NOTE — Consult Note (Signed)
Orthopaedic Trauma Service Consult  Reason for Consult: R distal femur fracture, Left femoral neck fracture  Referring Physician:  A. Ignacia Palma, MD (EDP)   HPI: 79 y/o female with complex medical hx who sustained a ground level fall yesterday afternoon.  Attempting to mobilize to a [facility] when the fall happened with aide.  Pt had immediate onset of R leg pain and L hip pain. Pt brought to [facility] hospital where she was found to have a R distal femur fx and L femoral neck fracture.  Pt with PMHx and Surg Hx of R hip fx. Pt has a DHS still in place to the R side.    Currently pt in room 5N24 complains of R knee and L hip pain.  Pt also has L knee and R ankle pain with evaluation.  States that she does ambulate on occasion with the use of a walker and supervision at the [facility]. Pt denies any CP, SOB, Abd pain.  No n/v/d. No fever, no chills. No H/A.      Past Medical History  Diagnosis Date  . Cirrhosis 1992    Cryptogenic  . Esophageal motility disorder 2006    Nonspecific  . GERD (gastroesophageal reflux disease)   . Peripheral neuropathy   . Insomnia   . Dementia   . Hypothyroid   . Anxiety disorder   . Arthritis   . Hypertension   . Depression     Past Surgical History  Procedure Laterality Date  . Appendectomy    . Vesicovaginal fistula closure w/ tah    . Eye surgery    . Abdominal hysterectomy    . Orif hip fracture  04/23/2011    Procedure: OPEN REDUCTION INTERNAL FIXATION HIP;  Surgeon: Darreld Mclean;  Location: [facility];  Service: Orthopedics;  Laterality: Right;    History reviewed. No pertinent family history.  Social History:  reports that she has never smoked. She has never used smokeless tobacco. She reports that she does not drink alcohol or use illicit drugs.  Allergies:  Allergies  Allergen Reactions  . Aspirin     hives  . Codeine     REACTION: UNKNOWN REACTION  . Morphine And Related Other (See Comments)    "completely knocks her out"  . Penicillins    . Sulfonamide Derivatives     REACTION: UNKNOWN REACTION  . Zolpidem Tartrate     REACTION: UNKNOWN REACTION    Medications: I have reviewed the patient's current medications.  Results for orders placed during the hospital encounter of 01/16/13 (from the past 48 hour(s))  CBC WITH DIFFERENTIAL     Status: Abnormal   Collection Time    01/16/13  5:52 PM      Result Value Range   WBC 5.5  4.0 - 10.5 K/uL   RBC 3.83 (*) 3.87 - 5.11 MIL/uL   Hemoglobin 11.0 (*) 12.0 - 15.0 g/dL   HCT 45.4 (*) 09.8 - 11.9 %   MCV 86.9  78.0 - 100.0 fL   MCH 28.7  26.0 - 34.0 pg   MCHC 33.0  30.0 - 36.0 g/dL   RDW 14.7 (*) 82.9 - 56.2 %   Platelets 68 (*) 150 - 400 K/uL   Comment: PLATELET COUNT CONFIRMED BY SMEAR   Neutrophils Relative % 79 (*) 43 - 77 %   Lymphocytes Relative 11 (*) 12 - 46 %   Monocytes Relative 7  3 - 12 %   Eosinophils Relative 3  0 - 5 %  Basophils Relative 0  0 - 1 %   Neutro Abs 4.3  1.7 - 7.7 K/uL   Lymphs Abs 0.6 (*) 0.7 - 4.0 K/uL   Monocytes Absolute 0.4  0.1 - 1.0 K/uL   Eosinophils Absolute 0.2  0.0 - 0.7 K/uL   Basophils Absolute 0.0  0.0 - 0.1 K/uL   WBC Morphology TOXIC GRANULATION     Comment: MILD LEFT SHIFT (1-5% METAS, OCC MYELO, OCC BANDS)  COMPREHENSIVE METABOLIC PANEL     Status: Abnormal   Collection Time    01/16/13  5:52 PM      Result Value Range   Sodium 144  135 - 145 mEq/L   Potassium 4.6  3.5 - 5.1 mEq/L   Chloride 115 (*) 96 - 112 mEq/L   CO2 20  19 - 32 mEq/L   Glucose, Bld 105 (*) 70 - 99 mg/dL   BUN 32 (*) 6 - 23 mg/dL   Creatinine, Ser 1.61 (*) 0.50 - 1.10 mg/dL   Calcium 8.6  8.4 - 09.6 mg/dL   Total Protein 6.7  6.0 - 8.3 g/dL   Albumin 2.9 (*) 3.5 - 5.2 g/dL   AST 46 (*) 0 - 37 U/L   ALT 24  0 - 35 U/L   Alkaline Phosphatase 199 (*) 39 - 117 U/L   Total Bilirubin 1.1  0.3 - 1.2 mg/dL   GFR calc non Af Amer 27 (*) >90 mL/min   GFR calc Af Amer 31 (*) >90 mL/min   Comment:            The eGFR has been calculated     using the  CKD EPI equation.     This calculation has not been     validated in all clinical     situations.     eGFR's persistently     <90 mL/min signify     possible Chronic Kidney Disease.  PROTIME-INR     Status: Abnormal   Collection Time    01/16/13  5:52 PM      Result Value Range   Prothrombin Time 15.6 (*) 11.6 - 15.2 seconds   INR 1.27  0.00 - 1.49  APTT     Status: Abnormal   Collection Time    01/16/13  5:52 PM      Result Value Range   aPTT 39 (*) 24 - 37 seconds   Comment:            IF BASELINE aPTT IS ELEVATED,     SUGGEST PATIENT RISK ASSESSMENT     BE USED TO DETERMINE APPROPRIATE     ANTICOAGULANT THERAPY.  HEMOGLOBIN A1C     Status: None   Collection Time    01/16/13  5:55 PM      Result Value Range   Hemoglobin A1C 5.5  <5.7 %   Comment: (NOTE)                                                                               According to the ADA Clinical Practice Recommendations for 2011, when     HbA1c is used as a screening test:      >=6.5%  Diagnostic of Diabetes Mellitus               (if abnormal result is confirmed)     5.7-6.4%   Increased risk of developing Diabetes Mellitus     References:Diagnosis and Classification of Diabetes Mellitus,Diabetes     Care,2011,34(Suppl 1):S62-S69 and Standards of Medical Care in             Diabetes - 2011,Diabetes Care,2011,34 (Suppl 1):S11-S61.   Mean Plasma Glucose 111  <117 mg/dL  SAMPLE TO BLOOD BANK     Status: None   Collection Time    01/16/13  6:04 PM      Result Value Range   Blood Bank Specimen SAMPLE AVAILABLE FOR TESTING     Sample Expiration 01/17/2013    TYPE AND SCREEN     Status: None   Collection Time    01/16/13  6:04 PM      Result Value Range   ABO/RH(D) O POS     Antibody Screen POS     Sample Expiration 01/19/2013     DAT, IgG NEG     Antibody Identification NO CLINICALLY SIGNIFICANT ANTIBODY IDENTIFIED.     Unit Number [id number]     Blood Component Type RED CELLS,LR     Unit  division 00     Status of Unit ALLOCATED     Transfusion Status OK TO TRANSFUSE     Crossmatch Result COMPATIBLE     Unit Number [id number]     Blood Component Type RED CELLS,LR     Unit division 00     Status of Unit ALLOCATED     Transfusion Status OK TO TRANSFUSE     Crossmatch Result COMPATIBLE    POCT I-STAT, CHEM 8     Status: Abnormal   Collection Time    01/16/13  6:28 PM      Result Value Range   Sodium 148 (*) 135 - 145 mEq/L   Potassium 4.6  3.5 - 5.1 mEq/L   Chloride 117 (*) 96 - 112 mEq/L   BUN 33 (*) 6 - 23 mg/dL   Creatinine, Ser 3.08 (*) 0.50 - 1.10 mg/dL   Glucose, Bld 657 (*) 70 - 99 mg/dL   Calcium, Ion 8.46  9.62 - 1.30 mmol/L   TCO2 18  0 - 100 mmol/L   Hemoglobin 11.2 (*) 12.0 - 15.0 g/dL   HCT 95.2 (*) 84.1 - 32.4 %  GLUCOSE, CAPILLARY     Status: Abnormal   Collection Time    01/16/13 10:16 PM      Result Value Range   Glucose-Capillary 116 (*) 70 - 99 mg/dL  SURGICAL PCR SCREEN     Status: None   Collection Time    01/17/13  3:58 AM      Result Value Range   MRSA, PCR NEGATIVE  NEGATIVE   Staphylococcus aureus NEGATIVE  NEGATIVE   Comment:            The Xpert SA Assay (FDA     approved for NASAL specimens     in patients over 46 years of age),     is one component of     a comprehensive surveillance     program.  Test performance has     been validated by [employer] for patients greater     than or equal to 46 year old.     It is not intended     to  diagnose infection nor to     guide or monitor treatment.  URINALYSIS, ROUTINE W REFLEX MICROSCOPIC     Status: Abnormal   Collection Time    01/17/13  4:30 AM      Result Value Range   Color, Urine YELLOW  YELLOW   APPearance TURBID (*) CLEAR   Specific Gravity, Urine 1.022  1.005 - 1.030   pH 7.0  5.0 - 8.0   Glucose, UA NEGATIVE  NEGATIVE mg/dL   Hgb urine dipstick NEGATIVE  NEGATIVE   Bilirubin Urine NEGATIVE  NEGATIVE   Ketones, ur NEGATIVE  NEGATIVE mg/dL   Protein, ur  NEGATIVE  NEGATIVE mg/dL   Urobilinogen, UA 0.2  0.0 - 1.0 mg/dL   Nitrite POSITIVE (*) NEGATIVE   Leukocytes, UA MODERATE (*) NEGATIVE  URINE MICROSCOPIC-ADD ON     Status: Abnormal   Collection Time    01/17/13  4:30 AM      Result Value Range   Squamous Epithelial / LPF MANY (*) RARE   WBC, UA 7-10  <3 WBC/hpf   RBC / HPF 0-2  <3 RBC/hpf   Bacteria, UA MANY (*) RARE   Urine-Other MICROSCOPIC EXAM PERFORMED ON UNCONCENTRATED URINE     Comment: LESS THAN 10 mL OF URINE SUBMITTED  GLUCOSE, CAPILLARY     Status: Abnormal   Collection Time    01/17/13  5:38 AM      Result Value Range   Glucose-Capillary 109 (*) 70 - 99 mg/dL  CBC     Status: Abnormal   Collection Time    01/17/13  6:03 AM      Result Value Range   WBC 6.5  4.0 - 10.5 K/uL   RBC 3.61 (*) 3.87 - 5.11 MIL/uL   Hemoglobin 10.3 (*) 12.0 - 15.0 g/dL   HCT 16.1 (*) 09.6 - 04.5 %   MCV 87.5  78.0 - 100.0 fL   MCH 28.5  26.0 - 34.0 pg   MCHC 32.6  30.0 - 36.0 g/dL   RDW 40.9 (*) 81.1 - 91.4 %   Platelets 58 (*) 150 - 400 K/uL   Comment: CONSISTENT WITH PREVIOUS RESULT  BASIC METABOLIC PANEL     Status: Abnormal   Collection Time    01/17/13  6:03 AM      Result Value Range   Sodium 143  135 - 145 mEq/L   Potassium 4.1  3.5 - 5.1 mEq/L   Chloride 115 (*) 96 - 112 mEq/L   CO2 18 (*) 19 - 32 mEq/L   Glucose, Bld 112 (*) 70 - 99 mg/dL   BUN 34 (*) 6 - 23 mg/dL   Creatinine, Ser 7.82 (*) 0.50 - 1.10 mg/dL   Calcium 8.6  8.4 - 95.6 mg/dL   GFR calc non Af Amer 29 (*) >90 mL/min   GFR calc Af Amer 34 (*) >90 mL/min   Comment:            The eGFR has been calculated     using the CKD EPI equation.     This calculation has not been     validated in all clinical     situations.     eGFR's persistently     <90 mL/min signify     possible Chronic Kidney Disease.  PROTIME-INR     Status: Abnormal   Collection Time    01/17/13  6:03 AM      Result Value Range   Prothrombin Time 16.5 (*)  11.6 - 15.2 seconds   INR  1.37  0.00 - 1.49  GLUCOSE, CAPILLARY     Status: Abnormal   Collection Time    01/17/13  7:30 AM      Result Value Range   Glucose-Capillary 104 (*) 70 - 99 mg/dL   Comment 1 Notify RN     Comment 2 Documented in Chart      Dg Chest 1 View  01/16/2013   *RADIOLOGY REPORT*  Clinical Data: Fall  CHEST - 1 VIEW  Comparison: 02/01/2012  Findings: Single view of the chest was obtained.  No evidence for a pneumothorax.  Stable appearance of the heart and mediastinum. Streaky densities in the left upper lung could represent overlying bones and lung markings.  Otherwise, there is no focal airspace disease.  Degenerative changes in the thoracic spine.  IMPRESSION: Streaky densities in the left upper lung may represent overlying shadows and chronic changes.  Findings are nonspecific.   Original Report Authenticated By: Richarda Overlie, M.D.   Dg Hip Complete Left  01/16/2013   *RADIOLOGY REPORT*  Clinical Data: Fall and suspected hip fracture.  LEFT HIP - COMPLETE 2+ VIEW  Comparison: 04/18/2011 and 04/23/2011  Findings: There is a new fracture involving the left proximal femur.  Findings are most compatible with a subcapital fracture. The distal aspect is superiorly displaced.  Again noted is internal fixation of the proximal right femur from previous fracture. Pelvic bony ring appears to be intact.  The left hip is located.  IMPRESSION: Displaced left femoral subcapital fracture.   Original Report Authenticated By: Richarda Overlie, M.D.   Dg Femur Right  01/16/2013   *RADIOLOGY REPORT*  Clinical Data: Fall and suspected right femur fracture.  RIGHT FEMUR - 2 VIEW  Comparison: 04/23/2011 and 01/28/2012  Findings: Dynamic hip screw in the proximal right femur.  There is irregularity of the right femoral neck and right femoral trochanters.  This is probably from the previous injury and appears unchanged from 01/28/2012.  There is a new displaced oblique fracture in the distal right femur.  Fracture appears be mildly  comminuted.  Fracture is medially displaced.  The right hip is located.  Fracture is in the region of the distal diaphysis. Probable suprapatellar joint effusion.  IMPRESSION: Displaced comminuted fracture of the distal right femur.   Original Report Authenticated By: Richarda Overlie, M.D.   Ct Knee Right Wo Contrast  01/16/2013   *RADIOLOGY REPORT*  Clinical Data: Status post fall.  Right femur fracture.  CT OF THE RIGHT KNEE WITHOUT CONTRAST  Technique:  Multidetector CT imaging was performed according to the standard protocol. Multiplanar CT image reconstructions were also generated.  Comparison: Plain films earlier this same date.  Findings: The patient has a highly comminuted fracture of the distal diaphysis of the right femur.  The fracture is oblique in orientation originating 12.5 cm superior to the medial femoral condyle and extending in an inferior and oblique orientation through the lateral cortex of the diaphysis of the distal femur 5 cm above the lateral condyle.  There is fragment override of up to 3 cm.  There is one half shaft width medial displacement and fragment override about the 3 cm.  The fracture does not extend to the knee joint. No other fracture is identified.  There is no knee joint effusion.  The patient has osteoarthritis about the knee. Soft tissue structures demonstrate hematoma about the patient's fracture.  IMPRESSION: Highly comminuted fracture of the distal diaphysis of the right  femur as described.  The fracture does not involve the knee joint.   Original Report Authenticated By: Holley Dexter, M.D.    Review of Systems  Constitutional: Negative for fever and chills.  Eyes: Negative for blurred vision.  Respiratory: Negative for shortness of breath.   Cardiovascular: Negative for chest pain.  Gastrointestinal: Negative for nausea, vomiting and abdominal pain.  Musculoskeletal:       R knee pain R ankle pain L hip pain L knee pain   Neurological: Negative for  tingling, sensory change and headaches.   Blood pressure 124/46, pulse 99, temperature 97.5 F (36.4 C), temperature source Oral, resp. rate 20, height 5' (1.524 m), weight 69 kg (152 lb 1.9 oz), SpO2 93.00%. Physical Exam  Constitutional: She is cooperative.  Elderly appearing female   Cardiovascular: Regular rhythm, S1 normal and S2 normal.   Respiratory:  Clear B   GI:  + BS, NT, soft   Genitourinary:  Foley   Musculoskeletal:  B upper extremities   No acute findings   Motor and sensory functions grossly intact   + radial pulse B     Pelvis   No instability or pain with bony evaluation  Right Lower Extremity   Hip nontender   Healed surgical wound noted   Knee immobilizer on    Skin around R knee is stable, no open wounds    + swelling     Skin wrinkles with compression     TTP R distal femur    Lower leg with moderate swelling    + TTP along distal medial tibia, no gross crepitus or deformity     DPN, SPN, TN sensation grossly intact    EHL, FHL, AT, PT, Peroneals, gastroc motor intact     Ext warm, + DP pulse    Compartments soft and NT  Left Lower Extremity     L hip TTP     No open wounds or lesions to L hip    L distal femur TTP as well, + swelling, no lesions    Pain with L knee ROM     Tibia and ankle nontender, foot nontender    DPN, SPN, TN sensation intact     EHL, FHL, AT, PT, peroneals, gastroc motor intact as well    + DP pulse   Compartments soft and NT   Neurological: She is alert.  Pt oriented to person, place (hospital) and time (January 17, 2013)  Skin: Skin is warm and intact.  Psychiatric:  Pt describes clearly the events surrounding her accident. Speech is normal, mood and affect appear normal as well     Assessment/Plan:  79 y/o female s/p ground level fall  1. Fall 2. R distal femur fracture and L femoral neck fracture           Strongly recommend operative intervention to facilitate mobilization and pain control even if pt is only  bed to chair           Plan for either nail vs plate for fixation.  May need to remove some screws from [facility] regardless of implant selected           L femoral neck fracture will need hemiarthroplasty                      Plan for OR this afternoon            Pt will be NWB on R leg x 8 weeks  and WBAT on L leg for transfers post operatively             Continue bedrest for now           Ice to fx sites as needed for now  3. R ankle pain/swelling, L knee pain swelling  Check xrays to eval for fxs   Pt with very poor bone density, possible there are additional fxs present   4. UTI  Will give dose of Levaquin now  750 mg IV q 48 h given CrCl  5. Medical issues per primary service  6. DVT/PE prophylaxis           Hold lovenox as pt is thrombocytopenic and plan for OR today           Foot pumps or SCD's for now  7. FEN           NPO            Will get ST eval post op give hx of esophogeal dysmotility  8. Dispo           OR later this afternoon   Will contact Barnie Alderman, as she is patient's guardian Child psychotherapist [phone] Ext [phone]   Left message on voicemail at [phone]    Mearl Latin, PA-C [facility] [phone] (P) 01/17/2013, 8:13 AM

## 2013-01-17 NOTE — Progress Notes (Signed)
PT Cancellation Note  Patient Details Name: [name] MRN: [id number] DOB: October 30, 1922   Cancelled Treatment:    Reason Eval/Treat Not Completed: Medical issues which prohibited therapy  Likely for surgical fixation of LE fractures today Will follow up for mobilization/PT eval postop  Thanks,  Van Clines, [place] [phone]    Van Clines [facility] 01/17/2013, 8:35 AM

## 2013-01-17 NOTE — Progress Notes (Signed)
UR COMPLETED  

## 2013-01-17 NOTE — Brief Op Note (Signed)
01/16/2013 - 01/17/2013  7:52 PM  PATIENT:  [name]  79 y.o. female  PRE-OPERATIVE DIAGNOSIS:  r femur fracture supracondylar, retained hardware right hip, left displaced fem neck fracture  POST-OPERATIVE DIAGNOSIS:  right distal femur fracture supracondylar, retained hardware right hip, left femoral neck fracture  PROCEDURE:  Procedure(s): INTRAMEDULLARY (IM) RETROGRADE FEMORAL NAILING (Right) [employer] [place] [id number] statically locked HARDWARE REMOVAL OF RIGHT DHS (Right) ARTHROPLASTY BIPOLAR HIP (Left) [employer] [place] press fit #5 stem, standard neck, 44mm unipolar head  SURGEON:  Surgeon(s) and Role:    * Budd Palmer, MD - Primary  PHYSICIAN ASSISTANT: Montez Morita, [facility]  ANESTHESIA:   general  Total I/O In: 2650 [I.V.:2300; Blood:350] Out: 400 [Blood:400]  DRAINS: none   LOCAL MEDICATIONS USED:  NONE  SPECIMEN:  No Specimen  DISPOSITION OF SPECIMEN:  N/A  COUNTS:  YES  TOURNIQUET:  * No tourniquets in log *  DICTATION: .Other Dictation: Dictation Number [id number]  PLAN OF CARE: Admit to inpatient   PATIENT DISPOSITION:  PACU - hemodynamically stable.   Delay start of Pharmacological VTE agent (>24hrs) due to surgical blood loss or risk of bleeding: no

## 2013-01-17 NOTE — Consult Note (Signed)
PULMONARY  / CRITICAL CARE MEDICINE  Name: [name] MRN: [id number] DOB: 07/15/1922    ADMISSION DATE:  01/16/2013 CONSULTATION DATE:  01/17/2013  REFERRING MD :  Gaylord Shih PRIMARY SERVICE:  PCCM  CHIEF COMPLAINT:  Post op respiratory failure  BRIEF PATIENT DESCRIPTION: 79 yo with bilateral hip fracture s/p R femoral nailing, L arthroplasty brought to 2300 ICU intubated. PCCM was consulted.  SIGNIFICANT EVENTS / STUDIES:  7/13  R knee CT >>> Highly comminuted fracture of the distal diaphysis of the right femur as described. The fracture does not involve the knee joint. 7/14  OR >>> R femoral nailing, L arthroplasty  LINES / TUBES: OETT 7/14 >>> OGT 7/14 >>> Foley 7/14 >>> L IJ CVL 7/14 >>> L rad A-line 7/14 >>>  CULTURES:  ANTIBIOTICS: Levaquin 7/14 >>> Vancomycin 7/14 >>> Rifaximin 7/14 >>>  The patient is encephalopathic and unable to provide history, which was obtained for available medical records.  HISTORY OF PRESENT ILLNESS:  79 yo with bilateral hip fracture s/p R femoral nailing, L arthroplasty brought to 2300 ICU intubated. PCCM was consulted.  PAST MEDICAL HISTORY :  Past Medical History  Diagnosis Date  . Cirrhosis 1992    Cryptogenic  . Esophageal motility disorder 2006    Nonspecific  . GERD (gastroesophageal reflux disease)   . Peripheral neuropathy   . Insomnia   . Dementia   . Hypothyroid   . Anxiety disorder   . Arthritis   . Hypertension   . Depression    Past Surgical History  Procedure Laterality Date  . Appendectomy    . Vesicovaginal fistula closure w/ tah    . Eye surgery    . Abdominal hysterectomy    . Orif hip fracture  04/23/2011    Procedure: OPEN REDUCTION INTERNAL FIXATION HIP;  Surgeon: Darreld Mclean;  Location: [facility];  Service: Orthopedics;  Laterality: Right;   Prior to Admission medications   Medication Sig Start Date End Date Taking? Authorizing Provider  ascorbic acid (VITAMIN C) 500 MG tablet Take 500 mg by mouth  daily.   Yes Historical Provider, MD  calcium carbonate (OS-CAL) 600 MG TABS Take 600 mg by mouth 2 (two) times daily with a meal.     Yes Historical Provider, MD  cetirizine (ZYRTEC) 10 MG tablet Take 10 mg by mouth daily.   Yes Historical Provider, MD  docusate sodium (COLACE) 100 MG capsule Take 100 mg by mouth 2 (two) times daily.   Yes Historical Provider, MD  donepezil (ARICEPT) 10 MG tablet Take 10 mg by mouth at bedtime.     Yes Historical Provider, MD  furosemide (LASIX) 20 MG tablet Take 20 mg by mouth daily. Takes at lunchtime   Yes Historical Provider, MD  insulin aspart (NOVOLOG) 100 UNIT/ML injection Inject 0-9 Units into the skin 3 (three) times daily with meals. 70-120=0units, 121-150=1units, 151-200=2units, 201-250=3units, 251-300=5units, 301-350=7units, 351-400=9units >401=Call MD   Yes Historical Provider, MD  levothyroxine (SYNTHROID, LEVOTHROID) 75 MCG tablet Take 75 mcg by mouth daily.    Yes Historical Provider, MD  magnesium hydroxide (MILK OF MAGNESIA) 400 MG/5ML suspension Take 5 mLs by mouth daily as needed for constipation.   Yes Historical Provider, MD  Multiple Vitamin (MULTIVITAMIN WITH MINERALS) TABS Take 1 tablet by mouth daily.   Yes Historical Provider, MD  omeprazole (PRILOSEC) 20 MG capsule Take 20 mg by mouth daily.   Yes Historical Provider, MD  oxycodone (OXY-IR) 5 MG capsule Take 5 mg by mouth every  6 (six) hours as needed for pain.   Yes Historical Provider, MD  polyethylene glycol (MIRALAX / GLYCOLAX) packet Take 17 g by mouth daily.   Yes Historical Provider, MD  potassium chloride (KLOR-CON) 10 MEQ CR tablet Take 10 mEq by mouth 2 (two) times daily.    Yes Historical Provider, MD  pregabalin (LYRICA) 50 MG capsule Take 50 mg by mouth 3 (three) times daily.   Yes Historical Provider, MD  promethazine (PHENERGAN) 25 MG tablet Take 25 mg by mouth every 6 (six) hours as needed for nausea.   Yes Historical Provider, MD  XIFAXAN 550 MG TABS TAKE 2 TABLETS BY  MOUTH  ONCE DAILY. 12/08/10  Yes Nira Retort, NP  pantoprazole (PROTONIX) 40 MG tablet Take 1 tablet (40 mg total) by mouth daily at 12 noon. 04/28/11 04/27/12  Rhetta Mura, MD   Allergies  Allergen Reactions  . Aspirin     hives  . Codeine     REACTION: UNKNOWN REACTION  . Morphine And Related Other (See Comments)    "completely knocks her out"  . Penicillins   . Sulfonamide Derivatives     REACTION: UNKNOWN REACTION  . Zolpidem Tartrate     REACTION: UNKNOWN REACTION   FAMILY HISTORY:  History reviewed. No pertinent family history.  SOCIAL HISTORY:  reports that she has never smoked. She has never used smokeless tobacco. She reports that she does not drink alcohol or use illicit drugs.  REVIEW OF SYSTEMS:  Unable to provide.  INTERVAL HISTORY:  VITAL SIGNS: Temp:  [97.5 F (36.4 C)-98 F (36.7 C)] 97.5 F (36.4 C) (07/14 2240) Pulse Rate:  [87-110] 107 (07/14 2304) Resp:  [10-20] 20 (07/14 2304) BP: (79-131)/(24-46) 121/43 mmHg (07/14 2304) SpO2:  [91 %-97 %] 93 % (07/14 2304) Arterial Line BP: (92-146)/(35-52) 108/41 mmHg (07/14 2240) FiO2 (%):  [40 %] 40 % (07/14 2304)  HEMODYNAMICS:   VENTILATOR SETTINGS: Vent Mode:  [-] PSV;CPAP FiO2 (%):  [40 %] 40 % PEEP:  [5 cmH20] 5 cmH20 Pressure Support:  [10 cmH20] 10 cmH20  INTAKE / OUTPUT: Intake/Output     07/14 0701 - 07/15 0700   I.V. (mL/kg) 2900 (42)   Blood 996   IV Piggyback 250   Total Intake(mL/kg) 4146 (60.1)   Urine (mL/kg/hr) 350 (0.3)   Blood 500 (0.4)   Total Output 850   Net +3296        PHYSICAL EXAMINATION: General:  Appears acutely ill, mechanically ventilated, synchronous Neuro:  Encephalopathic, nonfocal, cough / gag diminished HEENT:  PERRL, OETT / OGT Cardiovascular:  RRR, no m/r/g Lungs:  Bilateral diminished air entry, no w/r/r Abdomen:  Soft, nontender, bowel sounds diminished Musculoskeletal:  Surgical dressing intact, no edema Skin:  Intact  LABS:  Recent Labs Lab  01/16/13 1752 01/16/13 1828 01/17/13 0603 01/17/13 1913 01/17/13 2225 01/17/13 2246  HGB 11.0* 11.2* 10.3* 8.5* 9.3*  --   WBC 5.5  --  6.5  --  10.0  --   PLT 68*  --  58*  --  44*  --   NA 144 148* 143 146*  --   --   K 4.6 4.6 4.1 4.6  --   --   CL 115* 117* 115*  --   --   --   CO2 20  --  18*  --   --   --   GLUCOSE 105* 104* 112* 144*  --   --   BUN 32* 33* 34*  --   --   --  CREATININE 1.66* 1.80* 1.53*  --   --   --   CALCIUM 8.6  --  8.6  --   --   --   AST 46*  --   --   --   --   --   ALT 24  --   --   --   --   --   ALKPHOS 199*  --   --   --   --   --   BILITOT 1.1  --   --   --   --   --   PROT 6.7  --   --   --   --   --   ALBUMIN 2.9*  --   --   --   --   --   APTT 39*  --   --   --   --   --   INR 1.27  --  1.37  --   --   --   PHART  --   --   --   --   --  7.264*  PCO2ART  --   --   --   --   --  36.5  PO2ART  --   --   --   --   --  73.0*    Recent Labs Lab 01/16/13 2216 01/17/13 0538 01/17/13 0730 01/17/13 1123 01/17/13 2100  GLUCAP 116* 109* 104* 105* 144*   CXR:  7/14 >>> Low ETT, no overt airspace disease  ASSESSMENT / PLAN:  PULMONARY A:  Acute post op respiratory failure. P:   Gaol SpO2>92, pH>7.30 Full mechanical support Daily SBT Trend ABG / CXR  CARDIOVASCULAR A: Hemodynamically stable.  No arrhythmia / ischemia. H/o HTN. P:  Goal MAP>60  RENAL A:  Acute renal failure. P:   Trend BMP NS@75   GASTROINTESTINAL A:  H/o cryptogenic cirrhosis, esophageal dysmotility, GERD. P:   NPO as intubated TF if remains intubated > 24 hours Protonix for GI Px Zofran, Reglan, Miralax  HEMATOLOGIC A:  Acute (blodd loss) on chronic anemia.  Thrombocytopenia. P:  Trend CBC SCDs for DVT Px  INFECTIOUS A:  No overt infection. P:   Prophylactic abx per Ortho as above  ENDOCRINE  A:  DM. Hypothyroidism. P:   SSI Levothyroxine   NEUROLOGIC A:  Acute encephalopathy.  Post op pain.  Dementia baseline.  Neuropathy. P:   RASS 0  to -1 Fentanyl gtt Hold Aricept / Lyrica  TODAY'S SUMMARY: Bilateral hip fracture s/p surgical repair.  Post op respiratory failure.  Mechanical ventilation overnight.  SBT with intent to extubate in AM.  I have personally obtained a history, examined the patient, evaluated laboratory and imaging results, formulated the assessment and plan and placed orders.  CRITICAL CARE:  The patient is critically ill with multiple organ systems failure and requires high complexity decision making for assessment and support, frequent evaluation and titration of therapies, application of advanced monitoring technologies and extensive interpretation of multiple databases. Critical Care Time devoted to patient care services described in this note is 40 minutes.   Lonia Farber, MD Pulmonary and Critical Care Medicine [facility] Pager: [phone]  01/17/2013, 11:13 PM

## 2013-01-17 NOTE — Transfer of Care (Signed)
Immediate Anesthesia Transfer of Care Note  Patient: [name]  Procedure(s) Performed: Procedure(s): INTRAMEDULLARY (IM) RETROGRADE FEMORAL NAILING (Right) HARDWARE REMOVAL OF RIGHT DHS (Right) ARTHROPLASTY BIPOLAR HIP (Left)  Patient Location: PACU  Anesthesia Type:General  Level of Consciousness: Patient remains intubated per anesthesia plan  Airway & Oxygen Therapy: Patient placed on Ventilator (see vital sign flow sheet for setting)  Post-op Assessment: Report given to PACU RN  Post vital signs: Reviewed and stable  Complications: No apparent anesthesia complications

## 2013-01-17 NOTE — Anesthesia Preprocedure Evaluation (Addendum)
Anesthesia Evaluation  Patient identified by MRN, date of birth, ID band Patient awake    Reviewed: Allergy & Precautions, H&P , NPO status , Patient's Chart, lab work & pertinent test results  History of Anesthesia Complications Negative for: history of anesthetic complications  Airway Mallampati: II TM Distance: >3 FB Neck ROM: Full    Dental  (+) Poor Dentition and Dental Advisory Given   Pulmonary neg pulmonary ROS,  breath sounds clear to auscultation  Pulmonary exam normal       Cardiovascular hypertension, Pt. on medications + dysrhythmias Atrial Fibrillation Rhythm:Irregular Rate:Normal     Neuro/Psych PSYCHIATRIC DISORDERS (dementia)    GI/Hepatic GERD-  Medicated and Controlled,(+) Cirrhosis -       ,   Endo/Other  diabetes (glu 105), Well Controlled, Insulin DependentHypothyroidism   Renal/GU Renal InsufficiencyRenal disease     Musculoskeletal   Abdominal   Peds  Hematology  (+) Blood dyscrasia (thrombocytopenia: plt 58k, Hb 10.3), anemia ,   Anesthesia Other Findings   Reproductive/Obstetrics                           Anesthesia Physical Anesthesia Plan  ASA: IV  Anesthesia Plan: General   Post-op Pain Management:    Induction: Intravenous  Airway Management Planned: Oral ETT  Additional Equipment: Arterial line and CVP  Intra-op Plan:   Post-operative Plan: Extubation in OR  Informed Consent: I have reviewed the patients History and Physical, chart, labs and discussed the procedure including the risks, benefits and alternatives for the proposed anesthesia with the patient or authorized representative who has indicated his/her understanding and acceptance.   Dental advisory given  Plan Discussed with: CRNA and Surgeon  Anesthesia Plan Comments: (Plan routine monitors, A line, Central line, GETA Transfusion likely)        Anesthesia Quick Evaluation

## 2013-01-17 NOTE — Anesthesia Procedure Notes (Addendum)
Procedure Name: Intubation Date/Time: 01/17/2013 3:58 PM Performed by: Margaree Mackintosh Pre-anesthesia Checklist: Patient identified, Timeout performed, Emergency Drugs available, Suction available and Patient being monitored Patient Re-evaluated:Patient Re-evaluated prior to inductionOxygen Delivery Method: Circle system utilized Preoxygenation: Pre-oxygenation with 100% oxygen Intubation Type: IV induction Ventilation: Mask ventilation without difficulty Laryngoscope Size: Mac and 3 Grade View: Grade I Tube type: Oral Tube size: 7.0 mm Number of attempts: 1 Airway Equipment and Method: Stylet and LTA kit utilized Placement Confirmation: ETT inserted through vocal cords under direct vision,  positive ETCO2 and breath sounds checked- equal and bilateral Secured at: 21 cm Tube secured with: Tape Dental Injury: Teeth and Oropharynx as per pre-operative assessment     CVP: Timeout, sterile prep, drape, FBP R neck.  Trendelenburg position.  1% lido local, finder and trocar LIJ 1st pass with US guidance.  2 lumen placed over J wire. Biopatch and sterile dressing on.  Patient tolerated well.  VSS.  Sandford Craze, MD  [phone]

## 2013-01-17 NOTE — Progress Notes (Addendum)
[facility] PROGRESS NOTE  [name] ZOX:[id number] DOB: [birth year] DOA: 01/16/2013 PCP: Toma Deiters, MD  Assessment/Plan: Mechanical fall resulting in displaced comminuted fracture of the distal right femur and displaced left femoral subcapital fracture  Management as per orthopedic service.  Patient due to her age has a high-risk during surgery. Patient not endorsing any cardiac or respiratory symptoms at this time, recommend no further cardiac workup. N.p.o. after midnight for possible surgery tomorrow. Also planning on checking CT of right knee to fully evaluate distal femur fracture. Patient will likely be nonweightbearing on right leg for 8 weeks and weight-bearing as tolerated on left leg for transfers postoperatively. Bed rest for now.   Right shoulder pain -x ray  Dementia  Stable. Patient is oriented to self, location, and time.  Ward of the state  Hypothyroidism  Continue levothyroxine.   Diabetes  Sliding scale insulin.   Acute renal failure  Likely due to diuresis from furosemide. Urine analysis pending. Hold furosemide and potassium for now. Reassess volume status daily. D/c IVF now  History of cryptogenic cirrhosis  Stable. Continue rifampin. Check ammonia level  Thrombocytopenia  Likely due to cirrhosis. Continue to monitor.   Mild anemia  Likely due to chronic disease and due to the fracture. Continue to monitor.   History of achalasia/GERD/history of aspiration pneumonia  Chest x-ray shows nonspecific changes, no signs of pneumonia. Continue to monitor. Patient at risk for aspiration. Continue PPI.   Anxiety/depression  Stable.   History of proximal A. Fib  Currently not on anticoagulation, likely due to fall risks. Currently in sinus rhythm. Rate controlled.   UTI -culture pending-levaquin  Prophylaxis  SCDs. No heparin due to thrombocytopenia and plan for surgery tomorrow.    Code Status: full Family Communication: patient and  pastor at bedside Disposition Plan: SNF   Consultants:  ortho  Procedures:    Antibiotics:    HPI/Subjective: C/o right shoulder pain  Objective: Filed Vitals:   01/16/13 2000 01/16/13 2044 01/16/13 2140 01/17/13 0540  BP: 134/51 105/44  124/46  Pulse: 94 97  99  Temp:  97.5 F (36.4 C)  97.5 F (36.4 C)  TempSrc:    Oral  Resp: 24 20  20   Height:   5' (1.524 m)   Weight:   69 kg (152 lb 1.9 oz)   SpO2: 92% 96%  93%    Intake/Output Summary (Last 24 hours) at 01/17/13 1024 Last data filed at 01/17/13 0700  Gross per 24 hour  Intake  437.5 ml  Output      0 ml  Net  437.5 ml   Filed Weights   01/16/13 2140  Weight: 69 kg (152 lb 1.9 oz)    Exam:   General:  Pleasant/cooperative- holding right shoulder  Cardiovascular: rrr  Respiratory: few crackles  Abdomen: +BS, soft, dist  Musculoskeletal: pain in 3 /4 extremitties   Data Reviewed: Basic Metabolic Panel:  Recent Labs Lab 01/16/13 1752 01/16/13 1828 01/17/13 0603  NA 144 148* 143  K 4.6 4.6 4.1  CL 115* 117* 115*  CO2 20  --  18*  GLUCOSE 105* 104* 112*  BUN 32* 33* 34*  CREATININE 1.66* 1.80* 1.53*  CALCIUM 8.6  --  8.6   Liver Function Tests:  Recent Labs Lab 01/16/13 1752  AST 46*  ALT 24  ALKPHOS 199*  BILITOT 1.1  PROT 6.7  ALBUMIN 2.9*   No results found for this basename: LIPASE, AMYLASE,  in the last 168 hours  No results found for this basename: AMMONIA,  in the last 168 hours CBC:  Recent Labs Lab 01/16/13 1752 01/16/13 1828 01/17/13 0603  WBC 5.5  --  6.5  NEUTROABS 4.3  --   --   HGB 11.0* 11.2* 10.3*  HCT 33.3* 33.0* 31.6*  MCV 86.9  --  87.5  PLT 68*  --  58*   Cardiac Enzymes: No results found for this basename: CKTOTAL, CKMB, CKMBINDEX, TROPONINI,  in the last 168 hours BNP (last 3 results) No results found for this basename: PROBNP,  in the last 8760 hours CBG:  Recent Labs Lab 01/16/13 2216 01/17/13 0538 01/17/13 0730  GLUCAP 116* 109*  104*    Recent Results (from the past 240 hour(s))  SURGICAL PCR SCREEN     Status: None   Collection Time    01/17/13  3:58 AM      Result Value Range Status   MRSA, PCR NEGATIVE  NEGATIVE Final   Staphylococcus aureus NEGATIVE  NEGATIVE Final   Comment:            The Xpert SA Assay (FDA     approved for NASAL specimens     in patients over 51 years of age),     is one component of     a comprehensive surveillance     program.  Test performance has     been validated by [employer] for patients greater     than or equal to 77 year old.     It is not intended     to diagnose infection nor to     guide or monitor treatment.     Studies: Dg Chest 1 View  01/16/2013   *RADIOLOGY REPORT*  Clinical Data: Fall  CHEST - 1 VIEW  Comparison: 02/01/2012  Findings: Single view of the chest was obtained.  No evidence for a pneumothorax.  Stable appearance of the heart and mediastinum. Streaky densities in the left upper lung could represent overlying bones and lung markings.  Otherwise, there is no focal airspace disease.  Degenerative changes in the thoracic spine.  IMPRESSION: Streaky densities in the left upper lung may represent overlying shadows and chronic changes.  Findings are nonspecific.   Original Report Authenticated By: Richarda Overlie, M.D.   Dg Hip Complete Left  01/16/2013   *RADIOLOGY REPORT*  Clinical Data: Fall and suspected hip fracture.  LEFT HIP - COMPLETE 2+ VIEW  Comparison: 04/18/2011 and 04/23/2011  Findings: There is a new fracture involving the left proximal femur.  Findings are most compatible with a subcapital fracture. The distal aspect is superiorly displaced.  Again noted is internal fixation of the proximal right femur from previous fracture. Pelvic bony ring appears to be intact.  The left hip is located.  IMPRESSION: Displaced left femoral subcapital fracture.   Original Report Authenticated By: Richarda Overlie, M.D.   Dg Femur Right  01/16/2013   *RADIOLOGY REPORT*   Clinical Data: Fall and suspected right femur fracture.  RIGHT FEMUR - 2 VIEW  Comparison: 04/23/2011 and 01/28/2012  Findings: Dynamic hip screw in the proximal right femur.  There is irregularity of the right femoral neck and right femoral trochanters.  This is probably from the previous injury and appears unchanged from 01/28/2012.  There is a new displaced oblique fracture in the distal right femur.  Fracture appears be mildly comminuted.  Fracture is medially displaced.  The right hip is located.  Fracture is in  the region of the distal diaphysis. Probable suprapatellar joint effusion.  IMPRESSION: Displaced comminuted fracture of the distal right femur.   Original Report Authenticated By: Richarda Overlie, M.D.   Ct Knee Right Wo Contrast  01/16/2013   *RADIOLOGY REPORT*  Clinical Data: Status post fall.  Right femur fracture.  CT OF THE RIGHT KNEE WITHOUT CONTRAST  Technique:  Multidetector CT imaging was performed according to the standard protocol. Multiplanar CT image reconstructions were also generated.  Comparison: Plain films earlier this same date.  Findings: The patient has a highly comminuted fracture of the distal diaphysis of the right femur.  The fracture is oblique in orientation originating 12.5 cm superior to the medial femoral condyle and extending in an inferior and oblique orientation through the lateral cortex of the diaphysis of the distal femur 5 cm above the lateral condyle.  There is fragment override of up to 3 cm.  There is one half shaft width medial displacement and fragment override about the 3 cm.  The fracture does not extend to the knee joint. No other fracture is identified.  There is no knee joint effusion.  The patient has osteoarthritis about the knee. Soft tissue structures demonstrate hematoma about the patient's fracture.  IMPRESSION: Highly comminuted fracture of the distal diaphysis of the right femur as described.  The fracture does not involve the knee joint.   Original  Report Authenticated By: Holley Dexter, M.D.   Dg Knee Left Port  01/17/2013   *RADIOLOGY REPORT*  Clinical Data: Fall.  Pain.  Distal right femur fracture.  Left hip fracture.  PORTABLE LEFT KNEE - 1-2 VIEW  Comparison: None.  Findings: The left knee is located.  Mild degenerative changes are again noted.  A small effusion is evident.  Mild osteopenia is noted.  Superficial soft tissue swelling is noted laterally.  IMPRESSION:  1.  Small joint effusion without to an acute osseous abnormality. 2.  Mild degenerative changes. 3.  Superficial soft tissue swelling over the lateral aspect of the left knee.   Original Report Authenticated By: Marin Roberts, M.D.   Dg Ankle Right Port  01/17/2013   *RADIOLOGY REPORT*  Clinical Data: Fall.  Right ankle pain.  PORTABLE RIGHT ANKLE - 2 VIEW  Comparison: Right ankle radiographs 07/31/2005.  Findings: Extensive bimalleolar soft tissue swelling is present. Mild osteopenia is noted.  No acute osseous abnormality is present. The ankle is located.  A small bone fragment associated with the medial malleolus is stable.  IMPRESSION:  1.  Diffuse edematous changes about the ankle. 2.  No acute osseous abnormality. 3.  Moderate generalized osteopenia. 4.  Remote corticated bone fragment associated with the medial malleolus.   Original Report Authenticated By: Marin Roberts, M.D.    Scheduled Meds: . sodium chloride   Intravenous Once  . calcium carbonate  1 tablet Oral BID WC  . docusate sodium  100 mg Oral BID  . donepezil  10 mg Oral QHS  . HYDROmorphone      . insulin aspart  0-9 Units Subcutaneous TID WC  . levofloxacin (LEVAQUIN) IV  500 mg Intravenous Q48H  . levothyroxine  75 mcg Oral QAC breakfast  . multivitamin with minerals  1 tablet Oral Daily  . pantoprazole  40 mg Oral Daily  . polyethylene glycol  17 g Oral Daily  . pregabalin  50 mg Oral TID  . rifaximin  550 mg Oral Daily  . vancomycin  1,000 mg Intravenous On Call to OR  . vitamin  C  500 mg Oral Daily   Continuous Infusions: . sodium chloride 50 mL/hr (01/17/13 0021)    Principal Problem:   Femur fracture, right Active Problems:   HYPOTHYROIDISM   DEMENTIA   ANXIETY   GERD   CIRRHOSIS   Fall   Fracture of femoral neck, left   Paroxysmal atrial fibrillation   Achalasia   Thrombocytopenia   History of aspiration pneumonitis   Diabetes mellitus   Acute renal failure   Anemia   Infection of urinary tract    Time spent: 35    [facility], Ritik Stavola  [facility] Pager [phone]. If 7PM-7AM, please contact night-coverage at [email or web address], password [facility] 01/17/2013, 10:24 AM  LOS: 1 day

## 2013-01-17 NOTE — Progress Notes (Signed)
Patient transported to SICU from PACU on vent. Report given to receiving RT.  Vent hooked up to oxygen, air, and red outlet.  RT will continue to monitor.

## 2013-01-17 NOTE — Progress Notes (Signed)
INITIAL NUTRITION ASSESSMENT  DOCUMENTATION CODES Per approved criteria  -Not Applicable   INTERVENTION: Supplement diet as needed after surgery.   NUTRITION DIAGNOSIS: Increased nutrient needs related to fracture/surgery as evidenced by estimated needs.   Goal: Pt to meet >/= 90% of their estimated nutrition needs.   Monitor:  PO intake, labs, I&O  Reason for Assessment: Hip Fracture Consult   79 y.o. female  Admitting Dx: Femur fracture, right  ASSESSMENT: Unable to assess pt at this time. Pt in xray for surgery this afternoon.   Height: Ht Readings from Last 1 Encounters:  01/16/13 5' (1.524 m)    Weight: Wt Readings from Last 1 Encounters:  01/16/13 152 lb 1.9 oz (69 kg)    Ideal Body Weight: 45.4 kg  % Ideal Body Weight: 152%  Wt Readings from Last 10 Encounters:  01/16/13 152 lb 1.9 oz (69 kg)  01/16/13 152 lb 1.9 oz (69 kg)  04/18/11 134 lb 14.7 oz (61.2 kg)  04/18/11 134 lb 14.7 oz (61.2 kg)  07/29/10 136 lb (61.689 kg)  06/14/09 152 lb 8 oz (69.174 kg)  03/28/09 157 lb (71.215 kg)    Usual Body Weight: 150 lb   % Usual Body Weight: 100%  BMI:  Body mass index is 29.71 kg/(m^2).  Estimated Nutritional Needs: Kcal: 1450-1600 Protein: 70-85 grams Fluid: > 1.5 L/day  Skin: abrasion, reddened area of buttocks  Diet Order: NPO  EDUCATION NEEDS: -No education needs identified at this time   Intake/Output Summary (Last 24 hours) at 01/17/13 0922 Last data filed at 01/17/13 0700  Gross per 24 hour  Intake  437.5 ml  Output      0 ml  Net  437.5 ml    Last BM: 7/13   Labs:   Recent Labs Lab 01/16/13 1752 01/16/13 1828 01/17/13 0603  NA 144 148* 143  K 4.6 4.6 4.1  CL 115* 117* 115*  CO2 20  --  18*  BUN 32* 33* 34*  CREATININE 1.66* 1.80* 1.53*  CALCIUM 8.6  --  8.6  GLUCOSE 105* 104* 112*    CBG (last 3)   Recent Labs  01/16/13 2216 01/17/13 0538 01/17/13 0730  GLUCAP 116* 109* 104*    Scheduled Meds: .  sodium chloride   Intravenous Once  . calcium carbonate  1 tablet Oral BID WC  . docusate sodium  100 mg Oral BID  . donepezil  10 mg Oral QHS  . HYDROmorphone      . insulin aspart  0-9 Units Subcutaneous TID WC  . levofloxacin (LEVAQUIN) IV  500 mg Intravenous Q48H  . levothyroxine  75 mcg Oral QAC breakfast  . multivitamin with minerals  1 tablet Oral Daily  . pantoprazole  40 mg Oral Daily  . polyethylene glycol  17 g Oral Daily  . pregabalin  50 mg Oral TID  . rifaximin  550 mg Oral Daily  . vancomycin  1,000 mg Intravenous Once  . vitamin C  500 mg Oral Daily    Continuous Infusions: . sodium chloride 50 mL/hr (01/17/13 0021)    Past Medical History  Diagnosis Date  . Cirrhosis 1992    Cryptogenic  . Esophageal motility disorder 2006    Nonspecific  . GERD (gastroesophageal reflux disease)   . Peripheral neuropathy   . Insomnia   . Dementia   . Hypothyroid   . Anxiety disorder   . Arthritis   . Hypertension   . Depression     Past  Surgical History  Procedure Laterality Date  . Appendectomy    . Vesicovaginal fistula closure w/ tah    . Eye surgery    . Abdominal hysterectomy    . Orif hip fracture  04/23/2011    Procedure: OPEN REDUCTION INTERNAL FIXATION HIP;  Surgeon: Darreld Mclean;  Location: [facility];  Service: Orthopedics;  Laterality: Right;    Kendell Bane RD, LDN, CNSC [phone] Pager [phone] After Hours Pager

## 2013-01-18 ENCOUNTER — Inpatient Hospital Stay (HOSPITAL_COMMUNITY): Payer: PRIVATE HEALTH INSURANCE

## 2013-01-18 LAB — VITAMIN D 25 HYDROXY (VIT D DEFICIENCY, FRACTURES): Vit D, 25-Hydroxy: 22 ng/mL — ABNORMAL LOW (ref 30–89)

## 2013-01-18 LAB — BASIC METABOLIC PANEL
BUN: 34 mg/dL — ABNORMAL HIGH (ref 6–23)
CO2: 18 mEq/L — ABNORMAL LOW (ref 19–32)
Chloride: 117 mEq/L — ABNORMAL HIGH (ref 96–112)
GFR calc Af Amer: 34 mL/min — ABNORMAL LOW (ref >90)
Potassium: 4.7 mEq/L (ref 3.5–5.1)

## 2013-01-18 LAB — PROTIME-INR
Prothrombin Time: 19.1 seconds — ABNORMAL HIGH (ref 11.6–15.2)
Prothrombin Time: 18.6 seconds — ABNORMAL HIGH (ref 11.6–15.2)

## 2013-01-18 LAB — CBC
HCT: 24.9 % — ABNORMAL LOW (ref 36.0–46.0)
Hemoglobin: 8.4 g/dL — ABNORMAL LOW (ref 12.0–15.0)
RBC: 2.9 MIL/uL — ABNORMAL LOW (ref 3.87–5.11)
RDW: 16.7 % — ABNORMAL HIGH (ref 11.5–15.5)
WBC: 8.7 10*3/uL (ref 4.0–10.5)

## 2013-01-18 LAB — POCT I-STAT 3, ART BLOOD GAS (G3+)
Bicarbonate: 17.6 mEq/L — ABNORMAL LOW (ref 20.0–24.0)
O2 Saturation: 88 %
TCO2: 19 mmol/L (ref 0–100)
pCO2 arterial: 37.1 mmHg (ref 35.0–45.0)
pO2, Arterial: 60 mmHg — ABNORMAL LOW (ref 80.0–100.0)

## 2013-01-18 LAB — GLUCOSE, CAPILLARY
Glucose-Capillary: 111 mg/dL — ABNORMAL HIGH (ref 70–99)
Glucose-Capillary: 104 mg/dL — ABNORMAL HIGH (ref 70–99)

## 2013-01-18 LAB — PREALBUMIN: Prealbumin: 7.7 mg/dL — ABNORMAL LOW (ref 17.0–34.0)

## 2013-01-18 LAB — APTT: aPTT: 39 seconds — ABNORMAL HIGH (ref 24–37)

## 2013-01-18 LAB — PREPARE PLATELET PHERESIS

## 2013-01-18 MED ORDER — FENTANYL CITRATE 0.05 MG/ML IJ SOLN
25.0000 ug | INTRAMUSCULAR | Status: DC | PRN
Start: 2013-01-18 — End: 2013-01-29
  Administered 2013-01-18 – 2013-01-19 (×12): 50 ug via INTRAVENOUS
  Administered 2013-01-20 (×2): 25 ug via INTRAVENOUS
  Administered 2013-01-21: 50 ug via INTRAVENOUS
  Administered 2013-01-21: 25 ug via INTRAVENOUS
  Administered 2013-01-21: 50 ug via INTRAVENOUS
  Administered 2013-01-21: 25 ug via INTRAVENOUS
  Administered 2013-01-22 – 2013-01-23 (×3): 50 ug via INTRAVENOUS
  Administered 2013-01-24: 25 ug via INTRAVENOUS
  Administered 2013-01-24 – 2013-01-26 (×7): 50 ug via INTRAVENOUS
  Filled 2013-01-18 (×31): qty 2

## 2013-01-18 MED ORDER — KCL IN DEXTROSE-NACL 20-5-0.45 MEQ/L-%-% IV SOLN
INTRAVENOUS | Status: DC
  Administered 2013-01-18 – 2013-01-19 (×2): via INTRAVENOUS
  Filled 2013-01-18 (×2): qty 1000

## 2013-01-18 NOTE — Progress Notes (Signed)
OT Cancellation Note  Patient Details Name: HALY FEHER MRN: [id number] DOB: 1923-05-07   Cancelled Treatment:    Reason Eval/Treat Not Completed: Patient not medically ready  [facility] Ramina Hulet, OTR/L  [phone] 01/18/2013 01/18/2013, 9:08 AM

## 2013-01-18 NOTE — Progress Notes (Signed)
Speech Language Pathology Discharge Patient Details Name: [name] MRN: [id number] DOB: 10-Nov-1922 Today's Date: 01/18/2013 Time:  -     Patient discharged from SLP services secondary to medical decline - will need to re-order SLP to resume therapy services.  Please see latest therapy progress note for current level of functioning and progress toward goals.    Progress and discharge plan discussed with patient and/or caregiver: did not discuss with pt.  GO    Breck Coons Makenzey Nanni M.Ed CCC-SLP Pager [phone]   01/18/2013, 7:58 AM

## 2013-01-18 NOTE — Progress Notes (Signed)
PULMONARY  / CRITICAL CARE MEDICINE  Name: [name] MRN: [id number] DOB: 1923/03/08    ADMISSION DATE:  01/16/2013 CONSULTATION DATE:  01/17/2013  REFERRING MD :  Gaylord Shih PRIMARY SERVICE:  PCCM  CHIEF COMPLAINT:  Post op respiratory failure  BRIEF PATIENT DESCRIPTION: 79 yo with bilateral hip fracture s/p R femoral nailing, L arthroplasty brought to 2300 ICU intubated. PCCM was consulted.  SIGNIFICANT EVENTS / STUDIES:  7/13  R knee CT >>> Highly comminuted fracture of the distal diaphysis of the right femur as described. The fracture does not involve the knee joint. 7/14  OR >>> R femoral nailing, L arthroplasty  LINES / TUBES: OETT 7/14 >>> 7/15 L IJ CVL 7/14 >>> L rad A-line 7/14 >>>  CULTURES:  ANTIBIOTICS: Levaquin (UTI) 7/14 >> 7/15 Vancomycin (surgical prophy) 7/14 >>> 7/15 Rifaximin (chronic med) 7/14   SUBJ: Passed SBT. + F/C. Extubated and tolerating  VITAL SIGNS: Temp:  [97.4 F (36.3 C)-99.4 F (37.4 C)] 98.8 F (37.1 C) (07/15 1535) Pulse Rate:  [81-110] 101 (07/15 1400) Resp:  [0-26] 19 (07/15 1400) BP: (79-156)/(22-141) 102/31 mmHg (07/15 1400) SpO2:  [92 %-100 %] 94 % (07/15 1400) Arterial Line BP: (92-146)/(35-52) 110/42 mmHg (07/15 1300) FiO2 (%):  [4 %-50 %] 4 % (07/15 1400)  HEMODYNAMICS:   VENTILATOR SETTINGS: Vent Mode:  [-] CPAP FiO2 (%):  [4 %-50 %] 4 % Set Rate:  [14 bmp] 14 bmp Vt Set:  [400 mL] 400 mL PEEP:  [5 cmH20] 5 cmH20 Pressure Support:  [5 cmH20-10 cmH20] 10 cmH20 Plateau Pressure:  [10 cmH20] 10 cmH20  INTAKE / OUTPUT: Intake/Output     07/14 0701 - 07/15 0700 07/15 0701 - 07/16 0700   I.V. (mL/kg) 3529.1 (51.1) 155 (2.2)   Blood 996    NG/GT  30   IV Piggyback 450    Total Intake(mL/kg) 4975.1 (72.1) 185 (2.7)   Urine (mL/kg/hr) 560 (0.3)    Blood 500 (0.3)    Total Output 1060     Net +3915.1 +185         PHYSICAL EXAMINATION: General: NAD Neuro:  No focal deficits HEENT:  PERL, EOMI Cardiovascular:   RRR s M Lungs: clear anteriorly Abdomen:  Soft, nontender, bowel sounds diminished Musculoskeletal:  Surgical dressing intact, trace bilateral edema Skin:  Intact  LABS:  Recent Labs Lab 01/16/13 1752 01/16/13 1828 01/17/13 0603 01/17/13 1913 01/17/13 2225 01/17/13 2246 01/17/13 2300 01/18/13 0017 01/18/13 0400 01/18/13 1420  HGB 11.0* 11.2* 10.3* 8.5* 9.3*  --   --   --   --  8.4*  WBC 5.5  --  6.5  --  10.0  --   --   --   --  8.7  PLT 68*  --  58*  --  44*  --   --   --   --  35*  NA 144 148* 143 146* 143  --   --   --   --   --   K 4.6 4.6 4.1 4.6 4.8  --   --   --   --   --   CL 115* 117* 115*  --  116*  --   --   --   --   --   CO2 20  --  18*  --  18*  --   --   --   --   --   GLUCOSE 105* 104* 112* 144* 150*  --   --   --   --   --  BUN 32* 33* 34*  --  30*  --   --   --   --   --   CREATININE 1.66* 1.80* 1.53*  --  1.25*  --   --   --   --   --   CALCIUM 8.6  --  8.6  --  7.7*  --   --   --   --   --   AST 46*  --   --   --  36  --   --   --   --   --   ALT 24  --   --   --  18  --   --   --   --   --   ALKPHOS 199*  --   --   --  119*  --   --   --   --   --   BILITOT 1.1  --   --   --  1.7*  --   --   --   --   --   PROT 6.7  --   --   --  5.0*  --   --   --   --   --   ALBUMIN 2.9*  --   --   --  2.4*  --   --   --   --   --   APTT 39*  --   --   --   --   --  39*  --   --   --   INR 1.27  --  1.37  --   --   --  1.66*  --  1.60*  --   PHART  --   --   --   --   --  7.264*  --  7.280*  --   --   PCO2ART  --   --   --   --   --  36.5  --  37.1  --   --   PO2ART  --   --   --   --   --  73.0*  --  60.0*  --   --     Recent Labs Lab 01/17/13 1123 01/17/13 2100 01/17/13 2346 01/18/13 0358 01/18/13 0750  GLUCAP 105* 144* 119* 111* 104*   CXR: LLL ATX  ASSESSMENT / PLAN:  PULMONARY A:  Acute post op respiratory failure. P:   Extubate and monitor  CARDIOVASCULAR A: Hemodynamically stable.  H/o HTN. P:  Monitor  RENAL A:  Renal insuff,  uncertain chronicity P:   Trend BMP IVFs adjusted  GASTROINTESTINAL A:  H/o cryptogenic cirrhosis, esophageal dysmotility, GERD. P:   NPO post extubation Cont SUP  HEMATOLOGIC A:  Acute on chronic anemia.  Thrombocytopenia. P:  Trend CBC SCDs for DVT Px Not a candidate for heparin or warfarin  INFECTIOUS A:  No overt infection. Mild pyuria P:   D/C levofloxacin  ENDOCRINE  A:  H/o DM.  High risk of hypoglycemia as NPO and has liver disease Hypothyroidism. P:   Monitor CBGs intermittently Hold insulin unless CBG > 200 Cont Levothyroxine   NEUROLOGIC A:  Acute encephalopathy.  Post op pain.  Dementia baseline.  Neuropathy. P:   D/C fent infusion PRN fentanyl post extubation  I have personally obtained a history, examined the patient, evaluated laboratory and imaging results, formulated the assessment and plan and placed orders.  CRITICAL CARE:  The patient is critically ill with  multiple organ systems failure and requires high complexity decision making for assessment and support, frequent evaluation and titration of therapies, application of advanced monitoring technologies and extensive interpretation of multiple databases. Critical Care Time devoted to patient care services described in this note is 30 minutes.   Billy Fischer, MD Pulmonary and Critical Care Medicine [facility] Pager: [phone]  01/18/2013, 3:57 PM

## 2013-01-18 NOTE — Clinical Social Work Psychosocial (Signed)
Clinical Social Work Department BRIEF PSYCHOSOCIAL ASSESSMENT 01/18/2013  Patient:  [name], [name]     Account Number:  [id number]     Admit date:  01/16/2013  Clinical Social Worker:  Madaline Guthrie  Date/Time:  01/18/2013 11:20 AM  Referred by:  Care Management  Date Referred:  01/18/2013 Referred for  Psychosocial assessment   Other Referral:   Interview type:  Other - See comment Other interview type:   Pt's guardian, Barnie Alderman [phone]) with DSS [facility].    PSYCHOSOCIAL DATA Living Status:  FACILITY Admitted from facility:  [facility] Level of care:  Skilled Nursing Facility Primary support name:  Barnie Alderman Primary support relationship to patient:  NONE Degree of support available:   good    CURRENT CONCERNS Current Concerns  Post-Acute Placement   Other Concerns:    SOCIAL WORK ASSESSMENT / PLAN CSW had phone call with pt's DSS guardian, Barnie Alderman. She states that pt became a ward of the state because her son was not taking care of her.  Melissa states that pt has expressed interest in moving to a SNF in [place] to be closer to her church friends. Melissa stated that she could transfer to [facility] or [facility] if they have a bed for her.  CSW will fax FL2 to [facility].  Both Melissa and CSW will confirm with pt what her wishes are re: returning to [facility] once we know what other bed options there are.   Assessment/plan status:  Other - See comment Other assessment/ plan:   Facilitate SNF placement.   Information/referral to community resources:    PATIENT'S/FAMILY'S RESPONSE TO PLAN OF CARE: Pt and guardian interested in exploring SNF bed options in [place].

## 2013-01-18 NOTE — Progress Notes (Signed)
Cpap/ps increased to 10/5, waiting on MD regarding extubation.

## 2013-01-18 NOTE — Progress Notes (Signed)
NUTRITION FOLLOW UP  Intervention:   Recommend initiation of nutrition support within 24-48 hours of intubation. If enteral nutrition is desired, recommend initiation of Osmolite 1.2 via enteral feeding tube at 15 ml/hr, advance by 10 ml q 4 hours, to goal of 45 ml/hr. Add 30 ml Prostat via tube BID. Goal regimen will provide: 1496 kcal, 90 grams protein and 886 ml free water. RD to continue to follow nutrition care plan.  Nutrition Dx:   Increased nutrient needs related to fracture/surgery as evidenced by estimated needs.   Goal:   Initiate nutrition support within 24-48 hours of intubation.  Monitor:   Vent status, weights, labs, I/O's, initiation of nutrition support  Assessment:   Admitted s/p fall. Sustained a displaced comminuted fracture of the distal right femur and displaced left femoral subcapital fracture.   Underwent the following on 7/14: INTRAMEDULLARY (IM) RETROGRADE FEMORAL NAILING (R)   HARDWARE REMOVAL OF RIGHT DHS (R)  ARTHROPLASTY BIPOLAR HIP (L)  Remains on the vent at this time. RN states pt is undergoing weaning attempts at this time.  Height: Ht Readings from Last 1 Encounters:  01/16/13 5' (1.524 m)    Weight Status:   Wt Readings from Last 1 Encounters:  01/16/13 152 lb 1.9 oz (69 kg)    Re-estimated needs:  Kcal: 1415 Protein: 85 - 100 grams Fluid: at least 1.5 liters daily  Skin:  R leg incision L hip incision Elbow abrasion  Diet Order: NPO   Intake/Output Summary (Last 24 hours) at 01/18/13 0854 Last data filed at 01/18/13 0700  Gross per 24 hour  Intake 4975.12 ml  Output   1060 ml  Net 3915.12 ml    Last BM: 7/13   Labs:   Recent Labs Lab 01/16/13 1752 01/16/13 1828 01/17/13 0603 01/17/13 1913 01/17/13 2225  NA 144 148* 143 146* 143  K 4.6 4.6 4.1 4.6 4.8  CL 115* 117* 115*  --  116*  CO2 20  --  18*  --  18*  BUN 32* 33* 34*  --  30*  CREATININE 1.66* 1.80* 1.53*  --  1.25*  CALCIUM 8.6  --  8.6  --  7.7*   GLUCOSE 105* 104* 112* 144* 150*    CBG (last 3)   Recent Labs  01/17/13 2346 01/18/13 0358 01/18/13 0750  GLUCAP 119* 111* 104*    Scheduled Meds: . antiseptic oral rinse  1 application Mouth Rinse QID  . calcium carbonate  1 tablet Oral BID WC  . chlorhexidine  15 mL Mouth/Throat BID  . docusate sodium  100 mg Oral BID  . insulin aspart  0-15 Units Subcutaneous Q4H  . levofloxacin (LEVAQUIN) IV  500 mg Intravenous Q48H  . levothyroxine  75 mcg Oral QAC breakfast  . midazolam      . multivitamin with minerals  1 tablet Oral Daily  . pantoprazole  40 mg Oral Daily  . polyethylene glycol  17 g Oral Daily  . rifaximin  550 mg Oral Daily  . vitamin C  500 mg Oral Daily    Continuous Infusions: . sodium chloride 75 mL/hr at 01/18/13 0000  . fentaNYL infusion INTRAVENOUS 25 mcg/hr (01/18/13 0005)    Jarold Motto MS, RD, LDN Pager: [phone] After-hours pager: [phone]

## 2013-01-18 NOTE — Procedures (Signed)
Extubation Procedure Note  Patient Details:   Name: [name] DOB: 04/30/1923 MRN: [id number]   Airway Documentation:     Evaluation  O2 sats: stable throughout and currently acceptable Complications: No apparent complications Patient did tolerate procedure well. Bilateral Breath Sounds: Clear Suctioning: Airway Yes Pt awake and alert. Extubated per MD order, placed on 4L [place], sat 94%. Positive cuff leak and pt able to vocalize. BBS cl  Arloa Koh 01/18/2013, 12:41 PM

## 2013-01-18 NOTE — Progress Notes (Signed)
Orthopaedic Trauma Service Progress Note     1 Day Post-Op  Subjective   Pt has been extubated Son at bedside Appears to be doing well  Objective  BP 100/22  Pulse 96  Temp(Src) 99.4 F (37.4 C) (Oral)  Resp 14  Ht 5' (1.524 m)  Wt 69 kg (152 lb 1.9 oz)  BMI 29.71 kg/m2  SpO2 97%  Intake/Output     07/14 0701 - 07/15 0700 07/15 0701 - 07/16 0700   I.V. (mL/kg) 3529.1 (51.1) 155 (2.2)   Blood 996    NG/GT  30   IV Piggyback 450    Total Intake(mL/kg) 4975.1 (72.1) 185 (2.7)   Urine (mL/kg/hr) 560 (0.3)    Blood 500 (0.3)    Total Output 1060     Net +3915.1 +185          Labs  Cbc and BMET for today were cancelled  Results for FYNN, ADEL (MRN [id number]) as of 01/18/2013 14:07  Ref. Range 01/18/2013 04:00  Prothrombin Time Latest Range: 11.6-15.2 seconds 18.6 (H)  INR Latest Range: 0.00-1.49  1.60 (H)     Exam  Gen: awake, appears comfortable  Ext:      B Lower Extremities  Dressings c/d/i  Distal motor and sensory functions are intact  Exts cool but pulses palpable   Assessment and Plan  1 Day Post-Op  79 y/o female s/p ground level fall  1. Fall 2. R distal femur fx and L femoral neck fx s/p IMN R femur and L hip hemiarthroplasty POD1  Bed to chair transfers    NWB R leg   WBAT L leg for transfers   L posterior hip precautions  Dressing changes tomorrow  3. UTI  >100,000 gram neg rods  Continue with current abx   4. ABL anemia  Pt received 2 units of PRBC's in OR yesterday, CBC cancelled for today for some reason  Will check cbc today  5.  Medical issues  Per CCM  6. DVT/PE prophylaxis  SCD's  7. FEN  ST eval pending   8. Dispo  Continue per CCM/IM  PT/OT evals once stable   Continue with ICU monitoring     Mearl Latin, PA-C [facility] [phone] (P) 01/18/2013 2:06 PM

## 2013-01-18 NOTE — Progress Notes (Signed)
PT Cancellation Note  Patient Details Name: [name] MRN: [id number] DOB: December 25, 1922   Cancelled Treatment:    Reason Eval/Treat Not Completed: Medical issues which prohibited therapy. Pt remains intubated postop and await clearance for mobility while intubated vs extubation to initiate evaluation. Will check back as time allows.   Toney Sang Beth 01/18/2013, 7:05 AM Delaney Meigs, PT [phone]

## 2013-01-18 NOTE — Progress Notes (Signed)
01/17/2013 Comment:  Notified by Dr Jacklynn Bue (anesthesiologist)  at approx 2215  that pt remained too lethargic to be extubated post op. Will need to be transferred to ICU.  Discussed pt w/ Dr Delford Field w/ critical care service who requested pt be transferred to ICU. CCM to manage while pt remains intubated. At bedside pt noted in NAD. VSS. Pt has been evaluated by Dr Marin Shutter and plan for now is SBT in AM with possible extubation. Appreciate CCM input. Will follow.  Leanne Chang, NP-C [facility] Pager [phone]

## 2013-01-18 NOTE — Op Note (Signed)
[name], ZANETTI NO.:  [id number]  MEDICAL RECORD NO.:  [id number]  LOCATION:  2312                         FACILITY:  [facility]  PHYSICIAN:  Doralee Albino. Carola Frost, M.D. DATE OF BIRTH:  July 24, 1922  DATE OF PROCEDURE:  01/17/2013 DATE OF DISCHARGE:                              OPERATIVE REPORT   PREOPERATIVE DIAGNOSES: 1. Right supracondylar femur fracture. 2. Displaced left femoral neck fracture. 3. Right hip retained hardware.  POSTOPERATIVE DIAGNOSES: 1. Right supracondylar femur fracture. 2. Displaced left femoral neck fracture. 3. Right hip retained hardware.  PROCEDURES: 1. Retrograde nailing of the right femur using a Biomet [place] 12 x     340 mm nail statically locked. 2. Left hip hemiarthroplasty using a DePuy [place] Basic press-fit     standard neck and unipolar 44 mm head. 3. Removal of deep implant, right femur DHS.  SURGEON:  Doralee Albino. Carola Frost, M.D.  ASSISTANT:  Mearl Latin, [place].  ANESTHESIA:  General.  COMPLICATIONS:  None.  I'S/O'S:  In 2650 mL, 2300 of crystalloid and colloid blood 350.  EBL:  400 mL.  URINARY OUTPUT:  Not yet recorded.  DRAINS:  None.  LOCAL:  None.  DISPOSITION:  To PACU.  CONDITION:  Stable.  BRIEF SUMMARY AND INDICATION FOR PROCEDURE:  [name] is an 79 year old female who sustained a ground level fall when transferring to her chair during which she sustained right femur fracture and left femoral neck fracture.  We discussed with her the risks and benefits of surgical repair versus non-surgical management and the patient understood these risks and did wish to proceed with surgical repair to facilitate transfers and reduce pain.  She did understand the risks to include heart attack, stroke, infection, nerve injury, vessel injury, leg length inequality, hip instability, need for further surgery, and many others and she did again wish to proceed.  Furthermore, this was discussed with her state  appointed guardian who likewise consented and wished to proceed with recommended repair for facilitation of the patient's comfort and mobility.  BRIEF SUMMARY OF PROCEDURE:  [name] was given antibiotics preoperatively.  A central line was placed as well and an arterial line. She was taken to the operating room, where general anesthesia was induced.  Her right lower extremity was prepped and draped first and brought in with a radiolucent triangle reduction obtained starting portal developed at the knee using orthogonal views to advance the guide pin in center-center of the distal femur at the anterior aspect of Blumensaat's line.  The old DHS incision was then reopened laterally. Dissection was carried down to the plate, which was visualized and with the help of my assistant who withdrew the screws while I held the area exposed, the hardware was removed.  We then advanced the guidewire into the proximal femur at the junction of the lag screw into the femoral head and the plate and sequentially reamed the nail advanced and then 4 distal locks placed and 2 proximal.  The telescoping device to convert the distal screws to fixed angle was engaged.  All screws were checked for position within the rod and then all wounds were irrigated thoroughly, closed in standard layered  fashion.  Again Montez Morita, PA-C did assist me throughout and was closing the hardware removal while I continued with the nailing so he facilitated at each portion of procedure.  After application of a sterile gently compressive dressing, the patient was positioned with the left side up for the hip.  She did have a previous decubitus ulcer.  This was prepped out of the field.  A 7 approach was then made to the hip and IT band was put in line with the incision with the hip flexed up and adducted.  It was carried down to the proximal femur.  The piriformis was identified and divided.  It was confluent with the short  rotators.  The capsule was split back to the acetabulum and was quite thick proximally and anteriorly and some of it was excised with a 10-blade.  We did not encounter any significant bleeding there.  Fortunately acetabular surface was reasonably well- preserved and the neck cut was refined off the proximal femur after carefully placing retractors.  The head measured 44 mm.  This was trialed with an acetabular trial and found to be a perfect fit.  There was comminution off the posterior aspect of the neck and calcar, but we had sufficient bone and we were able to get a good fit and fill with sequential broaching up to a 5.  The hip was then reduced with trial components, taken through range of motion, found to be extremely stable. I did place the stem in additional anteversion.  It could not be dislocated and a bone hook was required to dislocate the hip.  We then placed the actual components using again a #5 stem, standard neck, and a 44 unipolar head.  The acetabulum was cleared of all debris as was the capsule around the neck and then the capsule repaired with #1 Vicryl, the piriformis with #2 FiberWire, then #1 Vicryl for the IT band, 0- Vicryl, 2-0 Vicryl, and staples for the skin.  Sterile gently compressive dressing was applied.  The patient was placed into a abduction pillow, and taken to the PACU in stable condition.  She did receive 1 unit of packed cells for a hematocrit of 24 intraoperatively as it was felt that repairing 2 femurs had resulted in sufficient blood loss plus it may have been lost due to hemodilution with proper hydration.  The patient had no other complications during the procedure. Montez Morita, PA-C again assisted throughout.  He was required as was an additional assistant with retraction and relocation and dislocation of the trial components and actual components.  PROGNOSIS:  Ms. Kaylor is at high risk for complications given her multiple comorbidities and  age.  She can weight bear as tolerated on the left for transfers.  She will be nonweightbearing on the right with unrestricted range of motion of the knee and hip.  Pharmacologic DVT prophylaxis will be deferred to the Medical Service, but she will be on DVT prophylaxis mechanically.  At this time, we anticipate a short course of Lovenox, but again we will defer to the Primary Service.     Doralee Albino. Carola Frost, M.D.     M[facility]/MEDQ  D:  01/17/2013  T:  01/18/2013  Job:  [id number]

## 2013-01-19 LAB — CBC
MCH: 28.6 pg (ref 26.0–34.0)
MCV: 87.6 fL (ref 78.0–100.0)
Platelets: 34 10*3/uL — ABNORMAL LOW (ref 150–400)
RDW: 16.9 % — ABNORMAL HIGH (ref 11.5–15.5)
WBC: 8 10*3/uL (ref 4.0–10.5)

## 2013-01-19 LAB — BASIC METABOLIC PANEL
Calcium: 8.3 mg/dL — ABNORMAL LOW (ref 8.4–10.5)
Creatinine, Ser: 1.58 mg/dL — ABNORMAL HIGH (ref 0.50–1.10)
GFR calc Af Amer: 33 mL/min — ABNORMAL LOW (ref >90)

## 2013-01-19 LAB — PROTIME-INR: Prothrombin Time: 17.4 seconds — ABNORMAL HIGH (ref 11.6–15.2)

## 2013-01-19 MED ORDER — DEXTROSE-NACL 5-0.45 % IV SOLN
INTRAVENOUS | Status: DC
  Administered 2013-01-19 – 2013-01-20 (×2): via INTRAVENOUS

## 2013-01-19 MED ORDER — SODIUM CHLORIDE 0.9 % IV BOLUS (SEPSIS)
500.0000 mL | Freq: Once | INTRAVENOUS | Status: AC
  Administered 2013-01-19: 500 mL via INTRAVENOUS

## 2013-01-19 NOTE — Evaluation (Signed)
Occupational Therapy Evaluation Patient Details Name: [name] MRN: [id number] DOB: 1922/12/17 Today's Date: 01/19/2013 Time: 1033-1100 OT Time Calculation (min): 27 min  OT Assessment / Plan / Recommendation History of present illness R distal femur fx and L femoral neck fx s/p IMN R femur and L hip hemiarthroplasty    Clinical Impression   Pt admitted with above dx sustained in a fall at her SNF in which she has been a resident for 2 years.  Pt was assisted for ADL and primarily used a w/c for mobility prior to admission.  Pt required +2 total assist for all mobility.  Will defer further OT to SNF.    OT Assessment  All further OT needs can be met in the next venue of care    Follow Up Recommendations  SNF    Barriers to Discharge      Equipment Recommendations       Recommendations for Other Services    Frequency       Precautions / Restrictions Precautions Precautions: Posterior Hip;Fall Precaution Booklet Issued: No Required Braces or Orthoses: Knee Immobilizer - Left Knee Immobilizer - Left: On at all times Restrictions Weight Bearing Restrictions: Yes LUE Weight Bearing: Weight bearing as tolerated RLE Weight Bearing: Non weight bearing   Pertinent Vitals/Pain 8/10 B LEs, medicated, iced, repositioned    ADL  Eating/Feeding: +1 Total assistance (ice chips) Where Assessed - Eating/Feeding: Chair Grooming: +1 Total assistance Where Assessed - Grooming: Unsupported sitting Upper Body Bathing: +1 Total assistance Where Assessed - Upper Body Bathing: Supported sitting Lower Body Bathing: +1 Total assistance Where Assessed - Lower Body Bathing: Supine, head of bed up;Rolling right and/or left Upper Body Dressing: +1 Total assistance Where Assessed - Upper Body Dressing: Supported sitting Lower Body Dressing: +1 Total assistance Where Assessed - Lower Body Dressing: Rolling right and/or left;Supine, head of bed up Transfers/Ambulation Related to ADLs: Total  assist +2 to lift to chair    OT Diagnosis: Generalized weakness;Cognitive deficits;Acute pain  OT Problem List: Decreased activity tolerance;Impaired balance (sitting and/or standing);Decreased cognition;Decreased knowledge of use of DME or AE;Decreased knowledge of precautions;Obesity;Pain OT Treatment Interventions:     OT Goals(Current goals can be found in the care plan section) Acute Rehab OT Goals Patient Stated Goal: Pain relief, return to SNF.  Visit Information  Last OT Received On: 01/19/13 Assistance Needed: +2 PT/OT Co-Evaluation/Treatment: Yes History of Present Illness: R distal femur fx and L femoral neck fx s/p IMN R femur and L hip hemiarthroplasty        Prior Functioning     Home Living Family/patient expects to be discharged to:: Skilled nursing facility Prior Function Level of Independence: Needs assistance Gait / Transfers Assistance Needed: pt walked "a little" with nursing staff at SNF ADL's / Homemaking Assistance Needed: Pt did her upper body bathing and dressing and fed herself, propelled her w/c around the facility. Communication Communication: No difficulties Dominant Hand: Right         Vision/Perception Vision - History Patient Visual Report: No change from baseline   Cognition  Cognition Arousal/Alertness: Awake/alert Behavior During Therapy: Anxious Overall Cognitive Status: History of cognitive impairments - at baseline    Extremity/Trunk Assessment Upper Extremity Assessment Upper Extremity Assessment: Generalized weakness;RUE deficits/detail RUE Deficits / Details: R shoulder limitations, longstanding Lower Extremity Assessment Lower Extremity Assessment: Defer to PT evaluation     Mobility Bed Mobility Bed Mobility: Supine to Sit;Sitting - Scoot to Edge of Bed Supine to Sit: 1: +2  Total assist Supine to Sit: Patient Percentage: 0% Sitting - Scoot to Edge of Bed: 1: +2 Total assist Sitting - Scoot to Edge of Bed: Patient  Percentage: 0% Transfers Details for Transfer Assistance: +2 total assist lift bed to chair with pad     Exercise     Balance Balance Balance Assessed: Yes Static Sitting Balance Static Sitting - Balance Support: Right upper extremity supported;Left upper extremity supported;Feet supported Static Sitting - Level of Assistance: 4: Min assist   End of Session OT - End of Session Activity Tolerance: Patient limited by pain Patient left: in chair;with call bell/phone within reach;with nursing/sitter in room Nurse Communication: Mobility status;Need for lift equipment;Patient requests pain meds  GO     [name] 01/19/2013, 11:15 AM [phone]

## 2013-01-19 NOTE — Progress Notes (Signed)
[facility] Progress Note     2 Days Post-Op  Subjective   C/o pain overnight R>L No acute changes    Objective  BP 89/40  Pulse 86  Temp(Src) 97.5 F (36.4 C) (Oral)  Resp 14  Ht 5' (1.524 m)  Wt 78.5 kg (173 lb 1 oz)  BMI 33.8 kg/m2  SpO2 96%  Intake/Output     07/15 0701 - 07/16 0700 07/16 0701 - 07/17 0700   P.O. 150    I.V. (mL/kg) 1110 (14.1)    Blood     NG/GT 30    IV Piggyback     Total Intake(mL/kg) 1290 (16.4)    Urine (mL/kg/hr) 555 (0.3)    Blood     Total Output 555     Net +735            Labs  Am labs pending   Exam  Gen:  Awake, currently comfortable  Ext:         B Lower Extremities             Dressings c/d/i             Distal motor and sensory functions are intact             Exts cool but pulses palpable     Assessment and Plan  2 Days Post-Op 79 y/o female s/p ground level fall  1. Fall 2. R distal femur fx and L femoral neck fx s/p IMN R femur and L hip hemiarthroplasty POD2             Bed to chair transfers                           NWB R leg                         WBAT L leg for transfers                         L posterior hip precautions             Dressing changes prn  3. UTI             + E. coli             Continue with current abx   Completed course of levaquin   4. ABL anemia                         CBC pending for this am but suspect pt will need 2 units   5.  Medical issues             Per CCM  6. DVT/PE prophylaxis             SCD's  7. FEN             ST swallow eval pending   8. Dispo             Continue per CCM/IM             PT/OT evals once stable                 Mearl Latin, PA-C [facility] [phone] (P) 01/19/2013 8:36 AM

## 2013-01-19 NOTE — Evaluation (Signed)
Physical Therapy Evaluation Patient Details Name: [name] MRN: [id number] DOB: 1922-09-11 Today's Date: 01/19/2013 Time: 1033-1100 PT Time Calculation (min): 27 min  PT Assessment / Plan / Recommendation History of Present Illness  R distal femur fx and L femoral neck fx s/p IMN R femur and L hip hemiarthroplasty   Clinical Impression  Pt has been a resident for 2 years. Pt was assisted for ADL and primarily used a w/c for mobility prior to admission. Pt required +2 total assist for all mobility. Will trial PT to see if patient can tolerate any functional mobility prior to dc return to SNF.     PT Assessment  Patient needs continued PT services    Follow Up Recommendations  SNF    Does the patient have the potential to tolerate intense rehabilitation    No     Equipment Recommendations  None recommended by PT       Frequency Min 2X/week    Precautions / Restrictions Precautions Precautions: Posterior Hip;Fall Precaution Booklet Issued: No Required Braces or Orthoses: Knee Immobilizer - Left Knee Immobilizer - Left: On at all times Restrictions Weight Bearing Restrictions: Yes LUE Weight Bearing: Weight bearing as tolerated RLE Weight Bearing: Non weight bearing   Pertinent Vitals/Pain 9/10 pain with any movement      Mobility  Bed Mobility Bed Mobility: Supine to Sit;Sitting - Scoot to Edge of Bed Supine to Sit: 1: +2 Total assist Supine to Sit: Patient Percentage: 0% Sitting - Scoot to Edge of Bed: 1: +2 Total assist Sitting - Scoot to Edge of Bed: Patient Percentage: 0% Transfers Transfers: Sit to Stand;Stand to Sit Sit to Stand: 1: +2 Total assist;From bed Sit to Stand: Patient Percentage: 0% Stand to Sit: 1: +2 Total assist;To chair/3-in-1 Stand to Sit: Patient Percentage: 0% Details for Transfer Assistance: +2 total assist lift bed to chair with pad        PT Diagnosis: Acute pain  PT Problem List: Decreased strength;Decreased range of  motion;Decreased activity tolerance;Decreased balance;Decreased mobility;Pain PT Treatment Interventions: Functional mobility training;Therapeutic activities;Therapeutic exercise;Balance training;Patient/family education     PT Goals(Current goals can be found in the care plan section) Acute Rehab PT Goals Patient Stated Goal: Pain relief, return to SNF. PT Goal Formulation: With patient Time For Goal Achievement: 02/02/13 Potential to Achieve Goals: Fair  Visit Information  Last PT Received On: 02/02/13 Assistance Needed: +2 History of Present Illness: R distal femur fx and L femoral neck fx s/p IMN R femur and L hip hemiarthroplasty        Prior Functioning  Home Living Family/patient expects to be discharged to:: Skilled nursing facility Prior Function Level of Independence: Needs assistance Gait / Transfers Assistance Needed: pt walked "a little" with nursing staff at SNF ADL's / Homemaking Assistance Needed: Pt did her upper body bathing and dressing and fed herself, propelled her w/c around the facility. Communication Communication: No difficulties Dominant Hand: Right    Cognition  Cognition Arousal/Alertness: Awake/alert Behavior During Therapy: Anxious Overall Cognitive Status: History of cognitive impairments - at baseline    Extremity/Trunk Assessment Upper Extremity Assessment Upper Extremity Assessment: Generalized weakness;RUE deficits/detail RUE Deficits / Details: R shoulder limitations, longstanding Lower Extremity Assessment Lower Extremity Assessment: RLE deficits/detail;LLE deficits/detail RLE: Unable to fully assess due to pain;Unable to fully assess due to immobilization LLE: Unable to fully assess due to pain;Unable to fully assess due to immobilization   Balance Balance Balance Assessed: Yes Static Sitting Balance Static Sitting - Balance Support:  Right upper extremity supported;Left upper extremity supported;Feet supported Static Sitting - Level  of Assistance: 4: Min assist  End of Session  Pt left in chair; nsg in room; call bell and needs in reach  GP     Fabio Asa 01/19/2013, 1:58 PM Charlotte Crumb, PT DPT  [phone]

## 2013-01-19 NOTE — Progress Notes (Addendum)
PULMONARY  / CRITICAL CARE MEDICINE  Name: [name] MRN: [id number] DOB: 19-Oct-1922    ADMISSION DATE:  01/16/2013 CONSULTATION DATE:  01/17/2013  REFERRING MD :  Gaylord Shih PRIMARY SERVICE:  PCCM  CHIEF COMPLAINT:  Post op respiratory failure  BRIEF PATIENT DESCRIPTION: 79 yo with bilateral hip fracture s/p R femoral nailing, L arthroplasty brought to 2300 ICU intubated. PCCM was consulted.  SIGNIFICANT EVENTS / STUDIES:  7/13  R knee CT >>> Highly comminuted fracture of the distal diaphysis of the right femur as described. The fracture does not involve the knee joint. 7/14  OR >>> R femoral nailing, L arthroplasty 7/16 LINES / TUBES: ETT 7/14 >>> 7/15 L rad A-line 7/14 >> 7/15 L IJ CVL 7/14 >>   CULTURES:  ANTIBIOTICS: Levaquin (UTI) 7/14 >> 7/15 Vancomycin (surgical prophy) 7/14 >> 7/15 Rifaximin (chronic med) 7/14   SUBJ: No distress. C/O hip pain  VITAL SIGNS: Temp:  [97.4 F (36.3 C)-99 F (37.2 C)] 98.1 F (36.7 C) (07/16 1544) Pulse Rate:  [85-103] 91 (07/16 1456) Resp:  [11-25] 13 (07/16 1456) BP: (88-113)/(21-44) 109/32 mmHg (07/16 1544) SpO2:  [87 %-100 %] 93 % (07/16 1456) FiO2 (%):  [4 %] 4 % (07/15 1900)  HEMODYNAMICS:   VENTILATOR SETTINGS: Vent Mode:  [-]  FiO2 (%):  [4 %] 4 %  INTAKE / OUTPUT: Intake/Output     07/15 0701 - 07/16 0700 07/16 0701 - 07/17 0700   P.O. 150    I.V. (mL/kg) 1110 (14.1) 200 (2.5)   Blood     NG/GT 30    IV Piggyback     Total Intake(mL/kg) 1290 (16.4) 200 (2.5)   Urine (mL/kg/hr) 555 (0.3) 165 (0.2)   Blood     Total Output 555 165   Net +735 +35         PHYSICAL EXAMINATION: General: NAD Neuro:  No focal deficits HEENT:  PERL, EOMI Cardiovascular:  RRR s M Lungs: clear anteriorly Abdomen:  Soft, nontender, bowel sounds diminished Musculoskeletal:  Surgical dressing intact, trace bilateral edema Skin:  Intact  LABS:  Recent Labs Lab 01/16/13 1752  01/17/13 0603  01/17/13 2225 01/17/13 2246  01/17/13 2300 01/18/13 0017 01/18/13 0400 01/18/13 1420 01/19/13 0700  HGB 11.0*  < > 10.3*  < > 9.3*  --   --   --   --  8.4* 8.3*  WBC 5.5  --  6.5  --  10.0  --   --   --   --  8.7 8.0  PLT 68*  --  58*  --  44*  --   --   --   --  35* 34*  NA 144  < > 143  < > 143  --   --   --   --  144 142  K 4.6  < > 4.1  < > 4.8  --   --   --   --  4.7 4.7  CL 115*  < > 115*  --  116*  --   --   --   --  117* 117*  CO2 20  --  18*  --  18*  --   --   --   --  18* 16*  GLUCOSE 105*  < > 112*  < > 150*  --   --   --   --  129* 129*  BUN 32*  < > 34*  --  30*  --   --   --   --  34* 36*  CREATININE 1.66*  < > 1.53*  --  1.25*  --   --   --   --  1.52* 1.58*  CALCIUM 8.6  --  8.6  --  7.7*  --   --   --   --  8.2* 8.3*  AST 46*  --   --   --  36  --   --   --   --   --   --   ALT 24  --   --   --  18  --   --   --   --   --   --   ALKPHOS 199*  --   --   --  119*  --   --   --   --   --   --   BILITOT 1.1  --   --   --  1.7*  --   --   --   --   --   --   PROT 6.7  --   --   --  5.0*  --   --   --   --   --   --   ALBUMIN 2.9*  --   --   --  2.4*  --   --   --   --   --   --   APTT 39*  --   --   --   --   --  39*  --   --   --   --   INR 1.27  --  1.37  --   --   --  1.66*  --  1.60*  --  1.47  PHART  --   --   --   --   --  7.264*  --  7.280*  --   --   --   PCO2ART  --   --   --   --   --  36.5  --  37.1  --   --   --   PO2ART  --   --   --   --   --  73.0*  --  60.0*  --   --   --   < > = values in this interval not displayed.  Recent Labs Lab 01/17/13 2100 01/17/13 2346 01/18/13 0358 01/18/13 0750 01/19/13 1152  GLUCAP 144* 119* 111* 104* 103*   CXR: LLL ATX  ASSESSMENT / PLAN:  PULMONARY A:  Acute post op respiratory failure, resolved P:   Cont to monitor  CARDIOVASCULAR A: H/o HTN. Mild hypotension P:  NS bolus ordered  RENAL A:  Renal insuff, uncertain chronicity oliguria P:   Monitor BMP NS bolus 7/16 IVFs adjusted Not a candidate for HD or other aggressive  eval/rx  GASTROINTESTINAL A:  H/o cryptogenic cirrhosis, esophageal dysmotility, GERD. P:   Swallow eval not indicated - cancelled Begin diet and monitor  HEMATOLOGIC A:  Acute on chronic anemia.   Thrombocytopenia. P:  Trend CBC SCDs for DVT Px Not a candidate for heparin or warfarin  INFECTIOUS A:  No overt infection. P:   Monitor off abx  ENDOCRINE  A:  H/o DM.  High risk of hypoglycemia given liver disease and no benefit to tight glycemic control Hypothyroidism. P:   Monitor CBGs intermittently Hold insulin unless CBG > 200 Cont Levothyroxine   NEUROLOGIC A:  Acute encephalopathy, resolved Post op pain.   Dementia baseline.  Neuropathy. P:   Cont rifaximin Cont PRN fentanyl   Transfer to SDU. TRH to resume primary duties as of 7/17 AM and PCCM will sign off. Discussed with Junious Silk, NP. Please call if we can be of further assistance    Billy Fischer, MD Pulmonary and Critical Care Medicine [facility] Pager: [phone]  01/19/2013, 3:56 PM

## 2013-01-20 ENCOUNTER — Inpatient Hospital Stay (HOSPITAL_COMMUNITY): Payer: PRIVATE HEALTH INSURANCE

## 2013-01-20 ENCOUNTER — Encounter (HOSPITAL_COMMUNITY): Payer: Self-pay | Admitting: Orthopedic Surgery

## 2013-01-20 DIAGNOSIS — A419 Sepsis, unspecified organism: Secondary | ICD-10-CM | POA: Diagnosis present

## 2013-01-20 LAB — VITAMIN D 1,25 DIHYDROXY
Vitamin D 1, 25 (OH)2 Total: 25 pg/mL (ref 18–72)
Vitamin D2 1, 25 (OH)2: <8 pg/mL

## 2013-01-20 LAB — TYPE AND SCREEN
DAT, IgG: NEGATIVE
Unit division: 0
Unit division: 0

## 2013-01-20 LAB — BASIC METABOLIC PANEL
BUN: 36 mg/dL — ABNORMAL HIGH (ref 6–23)
Calcium: 7.8 mg/dL — ABNORMAL LOW (ref 8.4–10.5)
GFR calc non Af Amer: 31 mL/min — ABNORMAL LOW (ref >90)
Glucose, Bld: 113 mg/dL — ABNORMAL HIGH (ref 70–99)

## 2013-01-20 LAB — AMMONIA: Ammonia: 48 umol/L (ref 11–60)

## 2013-01-20 LAB — LACTIC ACID, PLASMA: Lactic Acid, Venous: 1.3 mmol/L (ref 0.5–2.2)

## 2013-01-20 LAB — CBC
Hemoglobin: 8.6 g/dL — ABNORMAL LOW (ref 12.0–15.0)
MCH: 29.2 pg (ref 26.0–34.0)
MCHC: 33.8 g/dL (ref 30.0–36.0)
MCV: 86.6 fL (ref 78.0–100.0)
Platelets: 32 10*3/uL — ABNORMAL LOW (ref 150–400)
Platelets: 41 10*3/uL — ABNORMAL LOW (ref 150–400)
RBC: 2.87 MIL/uL — ABNORMAL LOW (ref 3.87–5.11)
RDW: 16.8 % — ABNORMAL HIGH (ref 11.5–15.5)
WBC: 6 10*3/uL (ref 4.0–10.5)

## 2013-01-20 LAB — GLUCOSE, CAPILLARY
Glucose-Capillary: 129 mg/dL — ABNORMAL HIGH (ref 70–99)
Glucose-Capillary: 140 mg/dL — ABNORMAL HIGH (ref 70–99)

## 2013-01-20 LAB — TROPONIN I
Troponin I: <0.3 ng/mL (ref <0.30)
Troponin I: <0.3 ng/mL (ref <0.30)

## 2013-01-20 MED ORDER — SODIUM CHLORIDE 0.9 % IV BOLUS (SEPSIS)
1000.0000 mL | Freq: Once | INTRAVENOUS | Status: AC
  Administered 2013-01-20: 1000 mL via INTRAVENOUS

## 2013-01-20 MED ORDER — CEFTRIAXONE SODIUM 1 G IJ SOLR
1.0000 g | INTRAMUSCULAR | Status: DC
  Administered 2013-01-20 – 2013-01-28 (×9): 1 g via INTRAVENOUS
  Filled 2013-01-20 (×12): qty 10

## 2013-01-20 MED ORDER — PANTOPRAZOLE SODIUM 40 MG IV SOLR
40.0000 mg | INTRAVENOUS | Status: DC
  Administered 2013-01-20 – 2013-01-23 (×4): 40 mg via INTRAVENOUS
  Filled 2013-01-20 (×5): qty 40

## 2013-01-20 MED ORDER — HYDROCORTISONE SOD SUCCINATE 100 MG IJ SOLR
50.0000 mg | Freq: Four times a day (QID) | INTRAMUSCULAR | Status: DC
  Administered 2013-01-20 – 2013-01-22 (×9): 50 mg via INTRAVENOUS
  Filled 2013-01-20 (×13): qty 1

## 2013-01-20 MED ORDER — SODIUM CHLORIDE 0.9 % IV SOLN
INTRAVENOUS | Status: DC
  Administered 2013-01-20: 150 mL/h via INTRAVENOUS
  Administered 2013-01-20 – 2013-01-25 (×4): via INTRAVENOUS

## 2013-01-20 MED ORDER — SODIUM CHLORIDE 0.9 % IV BOLUS (SEPSIS)
500.0000 mL | Freq: Once | INTRAVENOUS | Status: AC
  Administered 2013-01-20: 500 mL via INTRAVENOUS

## 2013-01-20 MED ORDER — LEVOTHYROXINE SODIUM 100 MCG IV SOLR
37.5000 ug | Freq: Every day | INTRAVENOUS | Status: DC
  Administered 2013-01-21 – 2013-01-22 (×2): 37.5 ug via INTRAVENOUS
  Filled 2013-01-20 (×4): qty 5

## 2013-01-20 MED ORDER — INSULIN ASPART 100 UNIT/ML ~~LOC~~ SOLN
0.0000 [IU] | SUBCUTANEOUS | Status: DC
Start: 2013-01-20 — End: 2013-01-22
  Administered 2013-01-20 – 2013-01-22 (×6): 1 [IU] via SUBCUTANEOUS

## 2013-01-20 NOTE — Progress Notes (Signed)
[facility] Progress Note [place] TEAM 1 - Stepdown/ICU TEAM   [name] ZOX:[id number] DOB: [birth year] DOA: 01/16/2013 PCP: Toma Deiters, MD  Brief narrative: 79 y.o. Caucasian female with history of cryptogenic cirrhosis, thrombocytopenia, GERD, achalasia, history of aspiration pneumonia in July of 2013 at [facility], dementia, hypothyroidism, hypertension, and depression who presents with the above complaints. Patient provided most of the history. She reported that after her lunch at around 130 p.m. an aide was helping out at the chair. She reported that the aides legs and her legs got tangled up and she had a mechanical fall. She immediately had right leg and left hip pain. She was brought to the emergency department and was found to have right distal femur fracture and left femoral subcapital fracture.  After operative repair/ R femoral nailing, L arthroplasty brought to 2300 ICU intubated and PCCM assumed care.She was extubated within 24 hours and watched in the unit an additional 24 hours before transferring out to the SDU.    Assessment/Plan: Active Problems:   Fall with the following injuries:   A) Femur fracture, right   B) Fracture of femoral neck, left -per ortho- stable    Severe sepsis with septic shock due to:   E. coli and Proteus UTI (urinary tract infection) -initially felt to have non infectious pyuria but cx this admit has revealed both E. Coli and proteus  -empiric Levaquin was dc'd 7/15 -E. Coli resistant to fluoroquinolones/Proteus senistivities are pending -begin Rocephin and adjust based on remaining cx -check blood cx's -supportive care with hydration and as above/did receive 2 L normal saline overnight-may need to change fluids over to normal saline from dextrose fluids and increase rate of 150 cc per hour -DNR so would not use pressors if does not respond to above measures -stress dose steroids initiated overnite into 7/17 by  PCCM    Acute renal failure -stable and influenced by ongoing hypotension -baseline is 20/1.11    Anemia/? Intraoperative blood loss -since hypotensive will tx 1 unit PRBC's 7/17 -RN unable to contact guardian to obtain blood consent- pt is symptomatic with recent hypotension so she meets requirement to proceed with transfusion due to medical necessity    HYPOTHYROIDISM -cont Synthroid -last documented TSH was 2012 but all OP records not available so since no acute indication to check will defer for now    Cryptogenic cirrhosis/ Thrombocytopenia -TB now and baseline <2.0 -Mild elevation AST intially likely low perfusion from [facility] -baseline platelets ~80-90,000 but now in the 30's -?? Lower platelets due to blood loss vs GN sepsis so will follow trends- may have mild DIC -Too lethargic to take PO     Paroxysmal atrial fibrillation -maintaining NSR    Diabetes mellitus type 2, controlled -cont SSI    Acute post op respiratory failure -still requiring VM oxygen and since no focal infiltrates suspect either aspiration vs ARDS -cont supportive care -DNR so would not pursue intubation and given significant encephalopathy can't safely use BiPAP    Encephalopathy acute/ DEMENTIA  -multifactorial due to severe infection/sepsis and hypotension -CHECK AMMONIA LEVEL    GERD -PPI but cautiously since on anbx's and incr risk for developing C. diff   DVT prophylaxis: SCDs Code Status: DO NOT RESUSCITATE Family Communication: Patient has a son but a legal guardian manages her clinical decisions: Pt's guardian, [name] [phone]) with DSS [employer].  Disposition Plan: Remain in step down Isolation: None Nutritional Status: Acute protein calorie malnutrition related  to critical illness in setting of chronic protein calorie malnutrition related to ongoing dementia  Consultants: Orthopedic PCCM signed off  Procedures: Right intramedullary femoral nailing, removal of  hardware on right, left hip arthroplasty  Dr. Carola Frost 01/17/13  Antibiotics: Levaquin 7/14 >>> 7/15 Vancomycin 7/14 >>> 7/15 Rocephin 7/17 >>>  HPI/Subjective: Patient unresponsive upon examination. No family at bedside   Objective: Blood pressure 112/66, pulse 76, temperature 98.1 F (36.7 C), temperature source Axillary, resp. rate 15, height 5' (1.524 m), weight 78 kg (171 lb 15.3 oz), SpO2 100.00%.  Intake/Output Summary (Last 24 hours) at 01/20/13 1236 Last data filed at 01/20/13 1200  Gross per 24 hour  Intake   3440 ml  Output    325 ml  Net   3115 ml     Exam: General: No acute respiratory distress but still requiring oxygen through a Ventimask Lungs: Coarse to auscultation bilaterally without wheezes but a few fine crackles in the bases crackles, 85% Ventimask Cardiovascular: Regular rate and rhythm without murmur gallop or rub normal S1 and S2, no peripheral edema or JVD-systolic blood pressure soft with lowest reading 76 over night with an MAP of 44-current MAP remains less than 65-IV fluid at 50 cc per hour Abdomen: Nontender, nondistended, soft, hypoactive bowel sounds positive, no rebound, no ascites, no appreciable mass Musculoskeletal: No significant cyanosis, clubbing of bilateral lower extremities Neurological: Unresponsive  Scheduled Meds: Scheduled Meds: . antiseptic oral rinse  1 application Mouth Rinse QID  . cefTRIAXone (ROCEPHIN)  IV  1 g Intravenous Q24H  . chlorhexidine  15 mL Mouth/Throat BID  . hydrocortisone sod succinate (SOLU-CORTEF) inj  50 mg Intravenous Q6H  . levothyroxine  75 mcg Oral QAC breakfast  . pantoprazole  40 mg Oral Daily  . polyethylene glycol  17 g Oral Daily  . rifaximin  550 mg Oral Daily  . vitamin C  500 mg Oral Daily   Continuous Infusions: . dextrose 5 % and 0.45% NaCl 50 mL/hr at 01/20/13 0319    **Reviewed in detail by the Attending Physician   Data Reviewed: Basic Metabolic Panel:  Recent Labs Lab  01/17/13 0603 01/17/13 1913 01/17/13 2225 01/18/13 1420 01/19/13 0700 01/20/13 0335  NA 143 146* 143 144 142 139  K 4.1 4.6 4.8 4.7 4.7 4.4  CL 115*  --  116* 117* 117* 115*  CO2 18*  --  18* 18* 16* 19  GLUCOSE 112* 144* 150* 129* 129* 113*  BUN 34*  --  30* 34* 36* 36*  CREATININE 1.53*  --  1.25* 1.52* 1.58* 1.45*  CALCIUM 8.6  --  7.7* 8.2* 8.3* 7.8*   Liver Function Tests:  Recent Labs Lab 01/16/13 1752 01/17/13 2225  AST 46* 36  ALT 24 18  ALKPHOS 199* 119*  BILITOT 1.1 1.7*  PROT 6.7 5.0*  ALBUMIN 2.9* 2.4*   No results found for this basename: LIPASE, AMYLASE,  in the last 168 hours No results found for this basename: AMMONIA,  in the last 168 hours CBC:  Recent Labs Lab 01/16/13 1752  01/17/13 0603 01/17/13 1913 01/17/13 2225 01/18/13 1420 01/19/13 0700 01/20/13 0335  WBC 5.5  --  6.5  --  10.0 8.7 8.0 6.1  NEUTROABS 4.3  --   --   --   --   --   --   --   HGB 11.0*  < > 10.3* 8.5* 9.3* 8.4* 8.3* 7.4*  HCT 33.3*  < > 31.6* 25.0* 27.9* 24.9* 25.4* 21.9*  MCV 86.9  --  87.5  --  86.9 85.9 87.6 86.6  PLT 68*  --  58*  --  44* 35* 34* 32*  < > = values in this interval not displayed. Cardiac Enzymes:  Recent Labs Lab 01/20/13 0539  TROPONINI <0.30   BNP (last 3 results) No results found for this basename: PROBNP,  in the last 8760 hours CBG:  Recent Labs Lab 01/18/13 0750 01/19/13 1152 01/19/13 2148 01/20/13 0821 01/20/13 1155  GLUCAP 104* 103* 105* 106* 129*    Recent Results (from the past 240 hour(s))  SURGICAL PCR SCREEN     Status: None   Collection Time    01/17/13  3:58 AM      Result Value Range Status   MRSA, PCR NEGATIVE  NEGATIVE Final   Staphylococcus aureus NEGATIVE  NEGATIVE Final   Comment:            The Xpert SA Assay (FDA     approved for NASAL specimens     in patients over 88 years of age),     is one component of     a comprehensive surveillance     program.  Test performance has     been validated by  [employer] for patients greater     than or equal to 68 year old.     It is not intended     to diagnose infection nor to     guide or monitor treatment.  URINE CULTURE     Status: None   Collection Time    01/17/13  4:30 AM      Result Value Range Status   Specimen Description URINE, CATHETERIZED   Final   Special Requests Normal   Final   Culture  Setup Time 01/17/2013 05:39   Final   Colony Count >=100,000 COLONIES/ML   Final   Culture     Final   Value: ESCHERICHIA COLI     PROTEUS MIRABILIS   Report Status PENDING   Incomplete   Organism ID, Bacteria ESCHERICHIA COLI   Final     Studies:  Recent x-ray studies have been reviewed in detail by the Attending Physician  Patient seen and examined with NP Junious Silk , agree with her assessment and plan . Richarda Overlie MD   Junious Silk, ANP [facility] Office  [phone] Pager [phone]  **If unable to reach the above provider after paging please contact the Flow Manager @ [phone]  On-Call/Text Page:      [email or web address]      password [email or web address]  If 7PM-7AM, please contact night-coverage [email or web address] Password [facility] 01/20/2013, 12:36 PM   LOS: 4 days

## 2013-01-20 NOTE — Progress Notes (Signed)
Pt's guardian has been contacted to obtain consent for blood transfusion , already left a message . Junious Silk NP was noified that was not able to get in touch with the guardians(Melissa Price and Despina Arias.)

## 2013-01-20 NOTE — Progress Notes (Signed)
Echo Lab  2D Echocardiogram completed.  Kazimierz Springborn L Jeevan Kalla, RDCS 01/20/2013 9:35 AM

## 2013-01-20 NOTE — Progress Notes (Signed)
ANTIBIOTIC CONSULT NOTE - INITIAL  Pharmacy Consult for ceftriaxone Indication: UTI  Allergies  Allergen Reactions  . Aspirin     hives  . Codeine     REACTION: UNKNOWN REACTION  . Morphine And Related Other (See Comments)    "completely knocks her out"  . Penicillins   . Sulfonamide Derivatives     REACTION: UNKNOWN REACTION  . Zolpidem Tartrate     REACTION: UNKNOWN REACTION    Patient Measurements: Height: 5' (152.4 cm) Weight: 171 lb 15.3 oz (78 kg) IBW/kg (Calculated) : 45.5   Vital Signs: Temp: 97.2 F (36.2 C) (07/17 0823) Temp src: Axillary (07/17 0823) BP: 98/39 mmHg (07/17 0900) Pulse Rate: 69 (07/17 0900) Intake/Output from previous day: 07/16 0701 - 07/17 0700 In: 3240 [P.O.:240; I.V.:1000; IV Piggyback:2000] Out: 390 [Urine:390] Intake/Output from this shift: Total I/O In: 150 [I.V.:150] Out: 50 [Urine:50]  Labs:  Recent Labs  01/18/13 1420 01/19/13 0700 01/20/13 0335  WBC 8.7 8.0 6.1  HGB 8.4* 8.3* 7.4*  PLT 35* 34* 32*  CREATININE 1.52* 1.58* 1.45*   Estimated Creatinine Clearance: 25.2 ml/min (by C-G formula based on Cr of 1.45). No results found for this basename: VANCOTROUGH, Leodis Binet, VANCORANDOM, GENTTROUGH, GENTPEAK, GENTRANDOM, TOBRATROUGH, TOBRAPEAK, TOBRARND, AMIKACINPEAK, AMIKACINTROU, AMIKACIN,  in the last 72 hours   Microbiology: Recent Results (from the past 720 hour(s))  SURGICAL PCR SCREEN     Status: None   Collection Time    01/17/13  3:58 AM      Result Value Range Status   MRSA, PCR NEGATIVE  NEGATIVE Final   Staphylococcus aureus NEGATIVE  NEGATIVE Final   Comment:            The Xpert SA Assay (FDA     approved for NASAL specimens     in patients over 18 years of age),     is one component of     a comprehensive surveillance     program.  Test performance has     been validated by [employer] for patients greater     than or equal to 77 year old.     It is not intended     to diagnose infection nor to      guide or monitor treatment.  URINE CULTURE     Status: None   Collection Time    01/17/13  4:30 AM      Result Value Range Status   Specimen Description URINE, CATHETERIZED   Final   Special Requests Normal   Final   Culture  Setup Time 01/17/2013 05:39   Final   Colony Count >=100,000 COLONIES/ML   Final   Culture     Final   Value: ESCHERICHIA COLI     PROTEUS MIRABILIS   Report Status PENDING   Incomplete   Organism ID, Bacteria ESCHERICHIA COLI   Final    Medical History: Past Medical History  Diagnosis Date  . Cirrhosis 1992    Cryptogenic  . Esophageal motility disorder 2006    Nonspecific  . GERD (gastroesophageal reflux disease)   . Peripheral neuropathy   . Insomnia   . Dementia   . Hypothyroid   . Anxiety disorder   . Arthritis   . Hypertension   . Depression     Medications:  Prescriptions prior to admission  Medication Sig Dispense Refill  . ascorbic acid (VITAMIN C) 500 MG tablet Take 500 mg by mouth daily.      Marland Kitchen  calcium carbonate (OS-CAL) 600 MG TABS Take 600 mg by mouth 2 (two) times daily with a meal.        . cetirizine (ZYRTEC) 10 MG tablet Take 10 mg by mouth daily.      Marland Kitchen docusate sodium (COLACE) 100 MG capsule Take 100 mg by mouth 2 (two) times daily.      Marland Kitchen donepezil (ARICEPT) 10 MG tablet Take 10 mg by mouth at bedtime.        . furosemide (LASIX) 20 MG tablet Take 20 mg by mouth daily. Takes at lunchtime      . insulin aspart (NOVOLOG) 100 UNIT/ML injection Inject 0-9 Units into the skin 3 (three) times daily with meals. 70-120=0units, 121-150=1units, 151-200=2units, 201-250=3units, 251-300=5units, 301-350=7units, 351-400=9units >401=Call MD      . levothyroxine (SYNTHROID, LEVOTHROID) 75 MCG tablet Take 75 mcg by mouth daily.       . magnesium hydroxide (MILK OF MAGNESIA) 400 MG/5ML suspension Take 5 mLs by mouth daily as needed for constipation.      . Multiple Vitamin (MULTIVITAMIN WITH MINERALS) TABS Take 1 tablet by mouth daily.      Marland Kitchen  omeprazole (PRILOSEC) 20 MG capsule Take 20 mg by mouth daily.      Marland Kitchen oxycodone (OXY-IR) 5 MG capsule Take 5 mg by mouth every 6 (six) hours as needed for pain.      . polyethylene glycol (MIRALAX / GLYCOLAX) packet Take 17 g by mouth daily.      . potassium chloride (KLOR-CON) 10 MEQ CR tablet Take 10 mEq by mouth 2 (two) times daily.       . pregabalin (LYRICA) 50 MG capsule Take 50 mg by mouth 3 (three) times daily.      . promethazine (PHENERGAN) 25 MG tablet Take 25 mg by mouth every 6 (six) hours as needed for nausea.      Marland Kitchen XIFAXAN 550 MG TABS TAKE 2 TABLETS BY MOUTH  ONCE DAILY.  62 tablet  5  . pantoprazole (PROTONIX) 40 MG tablet Take 1 tablet (40 mg total) by mouth daily at 12 noon.       Assessment: 79 year old woman to start on ceftriaxone for UTI.  Urine culture shows e.coli and proteus mirabilis.  The E.coli is sensitive to ceftriaxone and resistant to quinolones and Bactrim.  The proteus sensitivities are pending.    Medical records report a penicillin allergy with no reaction.  However, she has tolerated cefazolin in the past so will continue with ceftriaxone.  Goal of Therapy:  Treat infection  Plan:  Ceftriaxone 1g IV daily.  Follow up culture results.  Mickeal Skinner 01/20/2013,10:51 AM

## 2013-01-20 NOTE — Progress Notes (Signed)
[facility] Progress Note     3 Days Post-Op  Subjective   Pt in SDU Receiving blood No acute ortho issues Worked with PT, max as assist    Objective  BP 108/41  Pulse 79  Temp(Src) 98 F (36.7 C) (Oral)  Resp 22  Ht 5' (1.524 m)  Wt 78 kg (171 lb 15.3 oz)  BMI 33.58 kg/m2  SpO2 95%  Intake/Output     07/16 0701 - 07/17 0700 07/17 0701 - 07/18 0700   P.O. 240    I.V. (mL/kg) 1000 (12.8) 250 (3.2)   Blood  350   NG/GT     IV Piggyback 2000 50   Total Intake(mL/kg) 3240 (41.5) 650 (8.3)   Urine (mL/kg/hr) 390 (0.2) 50 (0.1)   Total Output 390 50   Net +2850 +600          Labs Labs reviewed Pt receiving PRBC's  Exam  Gen:  Awake, NAD  Ext:            Right Lower Extremity    Ace and dressings removed  Incisions look great  Distal motor/sens functions intact  Ext warm  + DP pulse  Swelling stable        Left Lower Extremity  Incision looks great at L hip, mepilex reapplied  Distal motor and sensory functions intact  Ext warm  Swelling stable    D/c knee immobilizer, was only on to help with hip precautions    Assessment and Plan  3 Days Post-Op  79 y/o female s/p ground level fall  1. Fall 2. R distal femur fx and L femoral neck fx s/p IMN R femur and L hip hemiarthroplasty POD2             Bed to chair transfers                           NWB R leg                         WBAT L leg for transfers                         L posterior hip precautions             Dressing changes prn  3. UTI             + E. Coli and proteus             quinolone resistant  On rocephin     4. ABL anemia, thrombocytopenia                   Blood transfusion in progress  Cbc in am  Monitor platelets  No active bleeding   5.  Medical issues             Per IM  6. DVT/PE prophylaxis             SCD's  7. FEN             ST eval completed  Dysphagia 1 diet   8. Dispo             will need SNF  Continue with SDU  Appreciate IM  and CCM    Mearl Latin, PA-C [facility] [phone] (P) 01/20/2013 6:17 PM

## 2013-01-20 NOTE — Progress Notes (Signed)
eLink Physician-Brief Progress Note Patient Name: [name] DOB: 1922/09/12 MRN: [id number]  Date of Service  01/20/2013   HPI/Events of Note  Still hypotensive. HR normal. Per RN patient not in distress and communicative Labs this AM unremarkable   Recent Labs Lab 01/18/13 1420 01/19/13 0700 01/20/13 0335  HGB 8.4* 8.3* 7.4*  HCT 24.9* 25.4* 21.9*  WBC 8.7 8.0 6.1  PLT 35* 34* 32*    Recent Labs Lab 01/17/13 0603 01/17/13 1913 01/17/13 2225 01/18/13 1420 01/19/13 0700 01/20/13 0335  NA 143 146* 143 144 142 139  K 4.1 4.6 4.8 4.7 4.7 4.4  CL 115*  --  116* 117* 117* 115*  CO2 18*  --  18* 18* 16* 19  GLUCOSE 112* 144* 150* 129* 129* 113*  BUN 34*  --  30* 34* 36* 36*  CREATININE 1.53*  --  1.25* 1.52* 1.58* 1.45*  CALCIUM 8.6  --  7.7* 8.2* 8.3* 7.8*    No results found for this basename: TROPONINI,  in the last 168 hours   eICU Interventions  1L fluid bolus Check cortisol and give hydrocort Check lactate, pct, troponin If still hypotensive, start pressopr   Intervention Category Major Interventions: Hypotension - evaluation and management  Zakery Normington 01/20/2013, 5:39 AM

## 2013-01-20 NOTE — Evaluation (Signed)
Clinical/Bedside Swallow Evaluation Patient Details  Name: [name] MRN: [id number] Date of Birth: Dec 26, 1922  Today's Date: 01/20/2013 Time: 7829-5621 SLP Time Calculation (min): 27 min  Past Medical History:  Past Medical History  Diagnosis Date  . Cirrhosis 1992    Cryptogenic  . Esophageal motility disorder 2006    Nonspecific  . GERD (gastroesophageal reflux disease)   . Peripheral neuropathy   . Insomnia   . Dementia   . Hypothyroid   . Anxiety disorder   . Arthritis   . Hypertension   . Depression    Past Surgical History:  Past Surgical History  Procedure Laterality Date  . Appendectomy    . Vesicovaginal fistula closure w/ tah    . Eye surgery    . Abdominal hysterectomy    . Orif hip fracture  04/23/2011    Procedure: OPEN REDUCTION INTERNAL FIXATION HIP;  Surgeon: Darreld Mclean;  Location: [facility];  Service: Orthopedics;  Laterality: Right;  . Femur im nail Right 01/17/2013    Procedure: INTRAMEDULLARY (IM) RETROGRADE FEMORAL NAILING;  Surgeon: Budd Palmer, MD;  Location: [facility] OR;  Service: Orthopedics;  Laterality: Right;  . Hardware removal Right 01/17/2013    Procedure: HARDWARE REMOVAL OF RIGHT DHS;  Surgeon: Budd Palmer, MD;  Location[facility] OR;  Service: Orthopedics;  Laterality: Right;  . Hip arthroplasty Left 01/17/2013    Procedure: ARTHROPLASTY BIPOLAR HIP;  Surgeon: Budd Palmer, MD;  Location[facility] OR;  Service: Orthopedics;  Laterality: Left;   HPI:  79 y.o. Caucasian female with history of cryptogenic cirrhosis, thrombocytopenia, GERD, achalasia, history of aspiration pneumonia in July of 2013 at [facility], dementia, hypothyroidism, hypertension, and depression who presents with the above complaints. Patient provided most of the history. She reported that after her lunch at around 130 p.m. an aide was helping out at the chair. She reported that the aides legs and her legs got tangled up and she had a mechanical fall. She  immediately had right leg and left hip pain. She was brought to the emergency department and was found to have right distal femur fracture and left femoral subcapital fracture.  After operative repair/ R femoral nailing, L arthroplasty brought to 2300 ICU intubated and PCCM assumed care.She was extubated within 24 hours and watched in the unit an additional 24 hours before transferring out to the SDU.   Assessment / Plan / Recommendation Clinical Impression  Pt demonstrates signs of esophageal dysphagia, previously diagnosed as achalasia. History in paper chart of visit to [facility] incomplete, but pt does indicate that she had surgery relating to this. Today pt able to consume 2 oz of pudding and 8 oz of water without regurgitation though mild belching and throat clearing observed. SLP offered POs, alternating solids and liquids, going very slowly. RN reports pt did have some regurgitation after being given pill. Recommend inititiating a dys 1 (puree) diet with thin liquids and pills crushed with esopahgeal precautions until mobility improves.     Aspiration Risk  Moderate    Diet Recommendation Dysphagia 1 (Puree);Thin liquid   Liquid Administration via: Cup;Straw Medication Administration: Crushed with puree Supervision: Staff feed patient Compensations: Slow rate;Small sips/bites;Follow solids with liquid Postural Changes and/or Swallow Maneuvers: Seated upright 90 degrees;Upright 30-60 min after meal    Other  Recommendations Oral Care Recommendations: Oral care BID   Follow Up Recommendations  Skilled Nursing facility    Frequency and Duration min 2x/week  2 weeks   Pertinent Vitals/Pain  NA    SLP Swallow Goals Patient will utilize recommended strategies during swallow to increase swallowing safety with: Maximum assistance Swallow Study Goal #2 - Progress: Progressing toward goal   Swallow Study Prior Functional Status       General HPI: 79 y.o. Caucasian female with history  of cryptogenic cirrhosis, thrombocytopenia, GERD, achalasia, history of aspiration pneumonia in July of 2013 at [facility], dementia, hypothyroidism, hypertension, and depression who presents with the above complaints. Patient provided most of the history. She reported that after her lunch at around 130 p.m. an aide was helping out at the chair. She reported that the aides legs and her legs got tangled up and she had a mechanical fall. She immediately had right leg and left hip pain. She was brought to the emergency department and was found to have right distal femur fracture and left femoral subcapital fracture.  After operative repair/ R femoral nailing, L arthroplasty brought to 2300 ICU intubated and PCCM assumed care.She was extubated within 24 hours and watched in the unit an additional 24 hours before transferring out to the SDU. Type of Study: Bedside swallow evaluation Previous Swallow Assessment: likely at [facility] Diet Prior to this Study: Thin liquids Temperature Spikes Noted: No Respiratory Status: Supplemental O2 delivered via (comment) History of Recent Intubation: Yes Length of Intubations (days): 2 days Date extubated: 01/18/13 Behavior/Cognition: Alert;Cooperative Oral Cavity - Dentition: Missing dentition Self-Feeding Abilities: Total assist Patient Positioning: Upright in bed Baseline Vocal Quality: Clear Volitional Cough: Weak Volitional Swallow: Able to elicit    Oral/Motor/Sensory Function Overall Oral Motor/Sensory Function: Appears within functional limits for tasks assessed   Ice Chips     Thin Liquid Thin Liquid: Impaired Presentation: Straw;Cup Pharyngeal  Phase Impairments:  (belching)    Nectar Thick Nectar Thick Liquid: Not tested   Honey Thick Honey Thick Liquid: Not tested   Puree Puree: Impaired Presentation: Spoon Pharyngeal Phase Impairments: Throat Clearing - Delayed (belching)   Solid   GO           Harlon Ditty, MA CCC-SLP  [phone]  Nyasha Rahilly, Riley Nearing 01/20/2013,2:51 PM

## 2013-01-20 NOTE — Progress Notes (Addendum)
eLink Physician-Brief Progress Note Patient Name: [name] DOB: March 14, 1923 MRN: [id number]  Date of Service  01/20/2013   HPI/Events of Note   Low bp per rN  eICU Interventions  500 ml fluid bolus x2   Intervention Category Major Interventions: Hypovolemia - evaluation and treatment with fluids  Rayce Brahmbhatt 01/20/2013, 2:33 AM

## 2013-01-21 ENCOUNTER — Inpatient Hospital Stay (HOSPITAL_COMMUNITY): Payer: PRIVATE HEALTH INSURANCE

## 2013-01-21 LAB — COMPREHENSIVE METABOLIC PANEL
AST: 42 U/L — ABNORMAL HIGH (ref 0–37)
BUN: 37 mg/dL — ABNORMAL HIGH (ref 6–23)
CO2: 15 mEq/L — ABNORMAL LOW (ref 19–32)
Calcium: 8.1 mg/dL — ABNORMAL LOW (ref 8.4–10.5)
Creatinine, Ser: 1.49 mg/dL — ABNORMAL HIGH (ref 0.50–1.10)
GFR calc Af Amer: 35 mL/min — ABNORMAL LOW (ref >90)
GFR calc non Af Amer: 30 mL/min — ABNORMAL LOW (ref >90)
Glucose, Bld: 120 mg/dL — ABNORMAL HIGH (ref 70–99)

## 2013-01-21 LAB — URINE CULTURE
Colony Count: >=100000
Special Requests: NORMAL

## 2013-01-21 LAB — CBC
HCT: 25.7 % — ABNORMAL LOW (ref 36.0–46.0)
MCHC: 33.9 g/dL (ref 30.0–36.0)
Platelets: 52 10*3/uL — ABNORMAL LOW (ref 150–400)
RDW: 16.8 % — ABNORMAL HIGH (ref 11.5–15.5)
WBC: 6 10*3/uL (ref 4.0–10.5)

## 2013-01-21 LAB — GLUCOSE, CAPILLARY
Glucose-Capillary: 117 mg/dL — ABNORMAL HIGH (ref 70–99)
Glucose-Capillary: 118 mg/dL — ABNORMAL HIGH (ref 70–99)
Glucose-Capillary: 123 mg/dL — ABNORMAL HIGH (ref 70–99)
Glucose-Capillary: 124 mg/dL — ABNORMAL HIGH (ref 70–99)

## 2013-01-21 LAB — TYPE AND SCREEN: Unit division: 0

## 2013-01-21 LAB — PROTIME-INR: INR: 1.22 (ref 0.00–1.49)

## 2013-01-21 LAB — PROCALCITONIN: Procalcitonin: 0.36 ng/mL

## 2013-01-21 MED ORDER — OXYCODONE-ACETAMINOPHEN 5-325 MG/5ML PO SOLN
5.0000 mL | Freq: Four times a day (QID) | ORAL | Status: DC | PRN
  Administered 2013-01-22 – 2013-01-26 (×7): 5 mL via ORAL
  Filled 2013-01-21 (×7): qty 5

## 2013-01-21 MED ORDER — OXYCODONE HCL 5 MG/5ML PO SOLN
5.0000 mg | ORAL | Status: DC | PRN
  Administered 2013-01-21 – 2013-01-25 (×7): 5 mg via ORAL
  Filled 2013-01-21 (×7): qty 5

## 2013-01-21 MED ORDER — ENSURE COMPLETE PO LIQD
237.0000 mL | Freq: Two times a day (BID) | ORAL | Status: DC
  Administered 2013-01-21 – 2013-01-28 (×14): 237 mL via ORAL

## 2013-01-21 MED ORDER — OXYCODONE-ACETAMINOPHEN 5-325 MG/5ML PO SOLN: 5.0000 mL | ORAL | Status: DC | PRN

## 2013-01-21 NOTE — Progress Notes (Signed)
Pharmacy note: ceftriaxone  Proteus and E.coli in urine both found to be susceptible to Ceftriaxone.  Plan: Continue ceftriaxone 1g IV daily Can change to oral cephalosporing like cephalexin 500mg  BID when ready for po.  Will sign off.  Thanks for the consult. Celedonio Miyamoto, PharmD, BCPS Clinical Pharmacist Pager [phone]

## 2013-01-21 NOTE — Progress Notes (Signed)
[facility] Progress Note [name] TEAM 1 - Stepdown/ICU TEAM   [name] BJY:[id number] DOB: [birth year] DOA: 01/16/2013 PCP: Toma Deiters, MD  Brief narrative: 79 y.o. Caucasian female with history of cryptogenic cirrhosis, thrombocytopenia, GERD, achalasia, history of aspiration pneumonia in July of 2013 at [facility], dementia, hypothyroidism, hypertension, and depression who presents with the above complaints. Patient provided most of the history. She reported that after her lunch at around 130 p.m. an aide was helping out at the chair. She reported that the aides legs and her legs got tangled up and she had a mechanical fall. She immediately had right leg and left hip pain. She was brought to the emergency department and was found to have right distal femur fracture and left femoral subcapital fracture.  After operative repair/ R femoral nailing, L arthroplasty brought to 2300 ICU intubated and PCCM assumed care.She was extubated within 24 hours and watched in the unit an additional 24 hours before transferring out to the SDU.    Assessment/Plan: Active Problems:   Fall with the following injuries:   A) Femur fracture, right   B) Fracture of femoral neck, left -per ortho- stable    Severe sepsis with septic shock due to:   E. coli and Proteus UTI (urinary tract infection) -initially felt to have non infectious pyuria but cx this admit has revealed both E. Coli and proteus  -empiric Levaquin was dc'd 7/15 -E. Coli resistant to fluoroquinolones/Proteus senistive to Rocephin -blood cx's pending -supportive care with hydration -DNR so would not use pressors  -stress dose steroids initiated overnite into 7/17 by PCCM    Acute renal failure -not improving so cont IVF and follow -baseline is 20/1.11    Anemia/? Intraoperative blood loss -since was hypotensive was given 1 unit PRBC's 7/17 -RN was unable to contact guardian to obtain blood consent- pt is  symptomatic with recent hypotension so she meets requirement to proceed with transfusion due to medical necessity    HYPOTHYROIDISM -cont Synthroid -last documented TSH was 2012 but all OP records not available so since no acute indication to check will defer for now    Cryptogenic cirrhosis/ Thrombocytopenia -TB now and baseline <2.0 -Mild elevation AST intially likely low perfusion from [facility] -baseline platelets ~80-90,000 but now in the 30's -?? Lower platelets due to blood loss vs GN sepsis so will follow trends- may have mild DIC -Too lethargic to take PO     Paroxysmal atrial fibrillation -maintaining NSR    Diabetes mellitus type 2, controlled -cont SSI    Acute post op respiratory failure -still requiring VM oxygen and CXR 7/18 worse so suspect aspiration due to recent AMS -cont supportive care and current anbxs- may need to broaden coverage  -DNR so would not pursue intubation     Encephalopathy acute/ DEMENTIA  -multifactorial due to severe infection/sepsis and hypotension -resolving- more alert 7/18 -Ammonia 48    GERD -PPI but cautiously since on anbx's and incr risk for developing C. diff   DVT prophylaxis: SCDs Code Status: DO NOT RESUSCITATE Family Communication: Patient has a son but a legal guardian manages her clinical decisions: Pt's guardian, [name] ([phone]) with DSS [employer].  Disposition Plan: Remain in step down Isolation: None Nutritional Status: Acute protein calorie malnutrition related to critical illness in setting of chronic protein calorie malnutrition related to ongoing dementia  Consultants: Orthopedic PCCM signed off  Procedures: Right intramedullary femoral nailing, removal of hardware on right, left hip  arthroplasty  Dr. Carola Frost 01/17/13  Antibiotics: Levaquin 7/14 >>> 7/15 Vancomycin 7/14 >>> 7/15 Rocephin 7/17 >>>  HPI/Subjective: Patient unresponsive upon examination. No family at  bedside   Objective: Blood pressure 117/40, pulse 84, temperature 97.9 F (36.6 C), temperature source Oral, resp. rate 21, height 5' (1.524 m), weight 78 kg (171 lb 15.3 oz), SpO2 95.00%.  Intake/Output Summary (Last 24 hours) at 01/21/13 1428 Last data filed at 01/21/13 1000  Gross per 24 hour  Intake 3026.5 ml  Output    650 ml  Net 2376.5 ml     Exam: General: No acute respiratory distress but still requiring oxygen through a Ventimask Lungs: Coarse to auscultation bilaterally without wheezes but a few fine crackles in the bases, 35% Ventimask Cardiovascular: Regular rate and rhythm without murmur gallop or rub normal S1 and S2, no peripheral edema or JVD Abdomen: Suprapubic tender with palpation, nondistended, soft, hypoactive bowel sounds positive, no rebound, no ascites, no appreciable mass Musculoskeletal: No significant cyanosis, clubbing of bilateral lower extremities Neurological: Alert and oriented to name only, MOE x 4 and no focal deficits appreciated  Scheduled Meds: Scheduled Meds: . antiseptic oral rinse  1 application Mouth Rinse QID  . cefTRIAXone (ROCEPHIN)  IV  1 g Intravenous Q24H  . chlorhexidine  15 mL Mouth/Throat BID  . feeding supplement  237 mL Oral BID BM  . hydrocortisone sod succinate (SOLU-CORTEF) inj  50 mg Intravenous Q6H  . insulin aspart  0-9 Units Subcutaneous Q4H  . levothyroxine  37.5 mcg Intravenous QAC breakfast  . pantoprazole (PROTONIX) IV  40 mg Intravenous Q24H   Continuous Infusions: . sodium chloride 150 mL/hr at 01/21/13 0615    **Reviewed in detail by the Attending Physician   Data Reviewed: Basic Metabolic Panel:  Recent Labs Lab 01/17/13 2225 01/18/13 1420 01/19/13 0700 01/20/13 0335 01/21/13 0550  NA 143 144 142 139 141  K 4.8 4.7 4.7 4.4 4.9  CL 116* 117* 117* 115* 114*  CO2 18* 18* 16* 19 15*  GLUCOSE 150* 129* 129* 113* 120*  BUN 30* 34* 36* 36* 37*  CREATININE 1.25* 1.52* 1.58* 1.45* 1.49*  CALCIUM 7.7*  8.2* 8.3* 7.8* 8.1*   Liver Function Tests:  Recent Labs Lab 01/16/13 1752 01/17/13 2225 01/21/13 0550  AST 46* 36 42*  ALT 24 18 24   ALKPHOS 199* 119* 101  BILITOT 1.1 1.7* 1.5*  PROT 6.7 5.0* 5.4*  ALBUMIN 2.9* 2.4* 2.3*   No results found for this basename: LIPASE, AMYLASE,  in the last 168 hours  Recent Labs Lab 01/20/13 1700  AMMONIA 48   CBC:  Recent Labs Lab 01/16/13 1752  01/18/13 1420 01/19/13 0700 01/20/13 0335 01/20/13 2245 01/21/13 0920  WBC 5.5  < > 8.7 8.0 6.1 6.0 6.0  NEUTROABS 4.3  --   --   --   --   --   --   HGB 11.0*  < > 8.4* 8.3* 7.4* 8.6* 8.7*  HCT 33.3*  < > 24.9* 25.4* 21.9* 25.0* 25.7*  MCV 86.9  < > 85.9 87.6 86.6 87.1 87.4  PLT 68*  < > 35* 34* 32* 41* 52*  < > = values in this interval not displayed. Cardiac Enzymes:  Recent Labs Lab 01/20/13 0539 01/20/13 1339 01/20/13 2245  TROPONINI <0.30 <0.30 <0.30   BNP (last 3 results) No results found for this basename: PROBNP,  in the last 8760 hours CBG:  Recent Labs Lab 01/20/13 1953 01/21/13 0007 01/21/13 0433 01/21/13 4098  01/21/13 1216  GLUCAP 140* 118* 117* 118* 123*    Recent Results (from the past 240 hour(s))  SURGICAL PCR SCREEN     Status: None   Collection Time    01/17/13  3:58 AM      Result Value Range Status   MRSA, PCR NEGATIVE  NEGATIVE Final   Staphylococcus aureus NEGATIVE  NEGATIVE Final   Comment:            The Xpert SA Assay (FDA     approved for NASAL specimens     in patients over 77 years of age),     is one component of     a comprehensive surveillance     program.  Test performance has     been validated by [employer] for patients greater     than or equal to 12 year old.     It is not intended     to diagnose infection nor to     guide or monitor treatment.  URINE CULTURE     Status: None   Collection Time    01/17/13  4:30 AM      Result Value Range Status   Specimen Description URINE, CATHETERIZED   Final   Special  Requests Normal   Final   Culture  Setup Time 01/17/2013 05:39   Final   Colony Count >=100,000 COLONIES/ML   Final   Culture     Final   Value: ESCHERICHIA COLI     PROTEUS MIRABILIS   Report Status 01/21/2013 FINAL   Final   Organism ID, Bacteria ESCHERICHIA COLI   Final   Organism ID, Bacteria PROTEUS MIRABILIS   Final  CULTURE, BLOOD (ROUTINE X 2)     Status: None   Collection Time    01/20/13 10:30 AM      Result Value Range Status   Specimen Description BLOOD ARM RIGHT   Final   Special Requests BOTTLES DRAWN AEROBIC ONLY 5.0CC   Final   Culture  Setup Time 01/20/2013 16:42   Final   Culture     Final   Value:        BLOOD CULTURE RECEIVED NO GROWTH TO DATE CULTURE WILL BE HELD FOR 5 DAYS BEFORE ISSUING A FINAL NEGATIVE REPORT   Report Status PENDING   Incomplete  CULTURE, BLOOD (ROUTINE X 2)     Status: None   Collection Time    01/20/13 10:40 AM      Result Value Range Status   Specimen Description BLOOD HAND RIGHT   Final   Special Requests BOTTLES DRAWN AEROBIC ONLY 3.0CC   Final   Culture  Setup Time 01/20/2013 16:42   Final   Culture     Final   Value:        BLOOD CULTURE RECEIVED NO GROWTH TO DATE CULTURE WILL BE HELD FOR 5 DAYS BEFORE ISSUING A FINAL NEGATIVE REPORT   Report Status PENDING   Incomplete     Studies:  Recent x-ray studies have been reviewed in detail by the Attending Physician  Patient seen and examined with NP [name] , agree with her assessment and plan . Melissa Overlie MD   [name], ANP [facility] Office  [phone] Pager [phone]  **If unable to reach the above provider after paging please contact the Flow Manager @ [phone]  On-Call/Text Page:      [email or web address]      password [email or web address]  If 7PM-7AM,  please contact night-coverage [email or web address] Password [email or web address] 01/21/2013, 2:28 PM   LOS: 5 days

## 2013-01-21 NOTE — Progress Notes (Signed)
NUTRITION FOLLOW UP  Intervention:    Ensure Complete twice daily (350 kcals, 13 gm protein per 8 fl oz bottle) RD to follow for nutrition care plan  Nutrition Dx:   Increased nutrient needs related to fracture/surgery as evidenced by estimated needs, ongoing  New Goal:   Oral intake with meals & supplements to meet >/= 90% of estimated nutrition needs, progressing  Monitor:   PO & supplemental intake, weight, labs, I/O's  Assessment:   Patient admitted s/p fall. Sustained a displaced comminuted fracture of the distal right femur and displaced left femoral subcapital fracture.   Patient s/p procedures 7/14: INTRAMEDULLARY (IM) RETROGRADE FEMORAL NAILING  HARDWARE REMOVAL OF RIGHT DHS  ARTHROPLASTY BIPOLAR HIP   Patient extubated 7/15.  S/p beside swallow evaluation 7/17.  SLP recommending Dysphagia 1, thin liquid diet.  PO intake poor at 25% per flowsheet records.  Would benefit from addition of nutrition supplements ---> RD to order.  Noted PT session very limited today due to overall fatigue and pain.  Height: Ht Readings from Last 1 Encounters:  01/16/13 5' (1.524 m)    Weight Status:   Wt Readings from Last 1 Encounters:  01/20/13 171 lb 15.3 oz (78 kg)    Body mass index is 33.58 kg/(m^2).  Re-estimated needs:  Kcal: 1500-1700 Protein: 80-90 gm Fluid: 1.5-1.7 L  Skin: leg, hip and elbow abrasion   Diet Order: Dysphagia 1, thin liquids   Intake/Output Summary (Last 24 hours) at 01/21/13 1312 Last data filed at 01/21/13 1000  Gross per 24 hour  Intake 3026.5 ml  Output    650 ml  Net 2376.5 ml    Labs:   Recent Labs Lab 01/19/13 0700 01/20/13 0335 01/21/13 0550  NA 142 139 141  K 4.7 4.4 4.9  CL 117* 115* 114*  CO2 16* 19 15*  BUN 36* 36* 37*  CREATININE 1.58* 1.45* 1.49*  CALCIUM 8.3* 7.8* 8.1*  GLUCOSE 129* 113* 120*    CBG (last 3)   Recent Labs  01/21/13 0007 01/21/13 0433 01/21/13 0841  GLUCAP 118* 117* 118*    Scheduled  Meds: . antiseptic oral rinse  1 application Mouth Rinse QID  . cefTRIAXone (ROCEPHIN)  IV  1 g Intravenous Q24H  . chlorhexidine  15 mL Mouth/Throat BID  . hydrocortisone sod succinate (SOLU-CORTEF) inj  50 mg Intravenous Q6H  . insulin aspart  0-9 Units Subcutaneous Q4H  . levothyroxine  37.5 mcg Intravenous QAC breakfast  . pantoprazole (PROTONIX) IV  40 mg Intravenous Q24H    Continuous Infusions: . sodium chloride 150 mL/hr at 01/21/13 0615    Maureen Chatters, RD, LDN Pager #: [phone] After-Hours Pager #: [phone]

## 2013-01-21 NOTE — Progress Notes (Signed)
[facility] Progress Note     4 Days Post-Op  Subjective   No acute interval changes from last night Ortho issues remain stable Pt c/o pain B LEx  PT notes reviewed  Pt with very low tolerance for activity   Tolerated transfusion well yesterday but did not note any significant changes   Objective  BP 117/40  Pulse 84  Temp(Src) 97.9 F (36.6 C) (Oral)  Resp 21  Ht 5' (1.524 m)  Wt 78 kg (171 lb 15.3 oz)  BMI 33.58 kg/m2  SpO2 95%  Patient Vitals for the past 24 hrs:  BP Temp Temp src Pulse Resp SpO2  01/21/13 1200 117/40 mmHg 97.9 F (36.6 C) Oral 84 21 95 %  01/21/13 0800 123/42 mmHg 96.6 F (35.9 C) Axillary 75 15 99 %  01/21/13 0600 100/43 mmHg - - 64 - 99 %  01/21/13 0433 102/35 mmHg 97.9 F (36.6 C) Axillary 72 16 94 %  01/21/13 0200 108/34 mmHg - - 76 23 97 %  01/21/13 0008 87/57 mmHg 97.7 F (36.5 C) Axillary 74 17 97 %  01/20/13 2200 103/49 mmHg - - 71 - 98 %  01/20/13 2000 107/35 mmHg 98.7 F (37.1 C) Oral 80 25 -  01/20/13 1945 86/71 mmHg 98.7 F (37.1 C) Oral 114 24 -  01/20/13 1900 103/44 mmHg 98.5 F (36.9 C) Oral 87 20 97 %  01/20/13 1851 91/57 mmHg 98.7 F (37.1 C) Oral 80 23 99 %  01/20/13 1751 108/41 mmHg 98 F (36.7 C) Oral 79 22 -  01/20/13 1722 - 98.4 F (36.9 C) Oral 75 21 -  01/20/13 1700 94/44 mmHg - - 72 14 97 %  01/20/13 1652 98/36 mmHg 98.3 F (36.8 C) Oral 77 15 -  01/20/13 1500 106/34 mmHg - - - - 95 %    Intake/Output     07/17 0701 - 07/18 0700 07/18 0701 - 07/19 0700   P.O. 0 240   I.V. (mL/kg) 2750 (35.3)    Blood 286.5    IV Piggyback 50    Total Intake(mL/kg) 3086.5 (39.6) 240 (3.1)   Urine (mL/kg/hr) 500 (0.3) 200 (0.4)   Total Output 500 200   Net +2586.5 +40          Labs Results for SUMIYA, MAMARIL (MRN [id number]) as of 01/21/2013 13:40  Ref. Range 01/21/2013 09:20  WBC Latest Range: 4.0-10.5 K/uL 6.0  RBC Latest Range: 3.87-5.11 MIL/uL 2.94 (L)  Hemoglobin Latest Range: 12.0-15.0 g/dL 8.7  (L)  HCT Latest Range: 36.0-46.0 % 25.7 (L)  MCV Latest Range: 78.0-100.0 fL 87.4  MCH Latest Range: 26.0-34.0 pg 29.6  MCHC Latest Range: 30.0-36.0 g/dL 19.1  RDW Latest Range: 11.5-15.5 % 16.8 (H)  Platelets Latest Range: 150-400 K/uL 52 (L)    Exam  Gen: awake and alert, resting in bed   Ext:            Right Lower Extremity               Dressings stable             Distal motor/sens functions intact             Ext warm             + DP pulse             Swelling stable        Left Lower Extremity  dressing stable              Distal motor and sensory functions intact             Ext warm             Swelling stable    Assessment and Plan  4 Days Post-Op  79 y/o female s/p ground level fall  1. Fall 2. R distal femur fx and L femoral neck fx s/p IMN R femur and L hip hemiarthroplasty POD4             Bed to chair transfers                           NWB R leg                         WBAT L leg for transfers                         L posterior hip precautions             Dressing changes prn  3. UTI             + E. Coli and proteus             quinolone resistant             On rocephin     4. ABL anemia, thrombocytopenia                    Improved h/h  Monitor platelets  No bleeding issues from ortho standpoint at this time   5.  Medical issues             Per IM  6. DVT/PE prophylaxis             SCD's  7. FEN             ST eval completed             Dysphagia 1 diet   8. Pain  Pt has been getting fentanyl for pain  Encourage trial of percocet solution as this may last longer for pt and provide better relief    Will need to monitor mental status and LFTs     9. Dispo             will need SNF             Continue with SDU             Appreciate IM and CCM   Work with therapies as pt can tolerate  Titrate pain meds     Mearl Latin, PA-C [facility] [phone] (P) 01/21/2013 1:38 PM

## 2013-01-21 NOTE — Progress Notes (Signed)
Physical Therapy Treatment Patient Details Name: [name] MRN: [id number] DOB: 06/17/1923 Today's Date: 01/21/2013 Time: 2202-5427 PT Time Calculation (min): 26 min  PT Assessment / Plan / Recommendation  PT Comments   Pt very limited this session due to overall fatigue and pain.  Spent most of the session performing pericare with multiple rolls left and right with RN assist.  After completing pericare pt with increase respiratory rate and 3/4 on dyspnea scale limiting further mobility.  At end of session attempted to place pt in chair position in bed however pt unable to tolerate due to bilateral hip pain.     Follow Up Recommendations  SNF     Does the patient have the potential to tolerate intense rehabilitation     Barriers to Discharge        Equipment Recommendations  None recommended by PT    Recommendations for Other Services    Frequency Min 2X/week   Progress towards PT Goals Progress towards PT goals: Progressing toward goals  Plan Current plan remains appropriate    Precautions / Restrictions Precautions Precautions: Posterior Hip;Fall (left posterior hip precautions) Precaution Booklet Issued: No Required Braces or Orthoses:  (d/c KI 01/20/13) Restrictions Weight Bearing Restrictions: Yes RLE Weight Bearing: Non weight bearing LLE Weight Bearing: Weight bearing as tolerated   Pertinent Vitals/Pain Bilateral hip pain but did not rate     Mobility  Bed Mobility Bed Mobility: Rolling Right;Rolling Left Rolling Right: 1: +2 Total assist;With rail Rolling Right: Patient Percentage: 10% Rolling Left: 1: +2 Total assist;With rail Rolling Left: Patient Percentage: 10% Details for Bed Mobility Assistance: +2 (A) with multiple rolls to complete pericare after BM incontinence. Transfers Transfers: Not assessed Ambulation/Gait Ambulation/Gait Assistance: Not tested (comment)    Exercises     PT Diagnosis:    PT Problem List:   PT Treatment  Interventions:     PT Goals (current goals can now be found in the care plan section) Acute Rehab PT Goals Patient Stated Goal: Pain relief, return to SNF. PT Goal Formulation: With patient Time For Goal Achievement: 02/02/13 Potential to Achieve Goals: Fair  Visit Information  Last PT Received On: 01/21/13 Assistance Needed: +2 History of Present Illness: R distal femur fx and L femoral neck fx s/p IMN R femur and L hip hemiarthroplasty     Subjective Data  Subjective: "Oh my hips hurt! Patient Stated Goal: Pain relief, return to SNF.   Cognition  Cognition Arousal/Alertness: Awake/alert Behavior During Therapy: Anxious Overall Cognitive Status: History of cognitive impairments - at baseline    Balance     End of Session PT - End of Session Equipment Utilized During Treatment: Oxygen (4L) Activity Tolerance: Patient limited by fatigue;Patient limited by pain Patient left: in bed;with call bell/phone within reach Nurse Communication: Mobility status   GP     Joslyn Ramos 01/21/2013, 1:25 PM  Jake Shark, PT DPT [phone]

## 2013-01-21 NOTE — Progress Notes (Signed)
Speech Language Pathology Dysphagia Treatment Patient Details Name: [name] MRN: [id number] DOB: [birth year] Today's Date: 01/21/2013 Time: 4098-1191 SLP Time Calculation (min): 25 min  Assessment / Plan / Recommendation Clinical Impression  Patient in bed with family at bedside, who informed SLP that she has had intermittent coughing with thin liquids.  Patient was observed consuming large sips of thin liquid via straw with immediate cough response.  SLP provided education regarding need to elevate bed from 45 degrees to 90 to reduce aspiration risk; however, SLP was unable to fully elevate due to pain level.  Trials of thin via cup resulted in intermittent delayed coughs, trials of ice cream resulted in no overt s/s.  As a result, it is recommended that this continue with current orders with p.o. being administered following pain management and upright positioning with continued full supervision.     Diet Recommendation  Continue with Current Diet: Dysphagia 1 (puree);Thin liquid    SLP Plan Continue with current plan of care   Pertinent Vitals/Pain 9/10 hip pain that limited ability to participate; RN made aware and medicated patient during session.    Swallowing Goals  SLP Swallowing Goals Swallow Study Goal #2 - Progress: Progressing toward goal  General Temperature Spikes Noted:  (3L) Respiratory Status: Supplemental O2 delivered via (comment) Behavior/Cognition: Alert;Cooperative Oral Cavity - Dentition: Missing dentition Patient Positioning: Upright in bed (unable to obtain 90 degrees)  Oral Cavity - Oral Hygiene Does patient have any of the following "at risk" factors?: Oxygen therapy - cannula, mask, simple oxygen devices Patient is AT RISK - Oral Care Protocol followed (see row info): Yes   Dysphagia Treatment Treatment focused on: Skilled observation of diet tolerance;Patient/family/caregiver education;Utilization of compensatory strategies Family/Caregiver  Educated: son and son's caregiver Treatment Methods/Modalities: Skilled observation Patient observed directly with PO's: Yes Type of PO's observed: Thin liquids Feeding: Needs assist Liquids provided via: Teaspoon Pharyngeal Phase Signs & Symptoms: Multiple swallows;Delayed cough;Immediate cough Type of cueing: Verbal Amount of cueing: Maximal   GO     Fae Pippin, M.A., CCC-SLP [phone]  Blas Riches 01/21/2013, 3:14 PM

## 2013-01-22 LAB — BASIC METABOLIC PANEL
BUN: 37 mg/dL — ABNORMAL HIGH (ref 6–23)
Creatinine, Ser: 1.34 mg/dL — ABNORMAL HIGH (ref 0.50–1.10)
GFR calc Af Amer: 40 mL/min — ABNORMAL LOW (ref >90)
GFR calc non Af Amer: 35 mL/min — ABNORMAL LOW (ref >90)
Glucose, Bld: 112 mg/dL — ABNORMAL HIGH (ref 70–99)
Potassium: 4.3 mEq/L (ref 3.5–5.1)

## 2013-01-22 LAB — PROTIME-INR: Prothrombin Time: 16.5 seconds — ABNORMAL HIGH (ref 11.6–15.2)

## 2013-01-22 LAB — TSH: TSH: 3.556 u[IU]/mL (ref 0.350–4.500)

## 2013-01-22 LAB — GLUCOSE, CAPILLARY
Glucose-Capillary: 107 mg/dL — ABNORMAL HIGH (ref 70–99)
Glucose-Capillary: 152 mg/dL — ABNORMAL HIGH (ref 70–99)

## 2013-01-22 MED ORDER — HYDROCORTISONE SOD SUCCINATE 100 MG IJ SOLR
50.0000 mg | Freq: Three times a day (TID) | INTRAMUSCULAR | Status: DC
  Administered 2013-01-22 – 2013-01-23 (×3): 50 mg via INTRAVENOUS
  Filled 2013-01-22 (×6): qty 1

## 2013-01-22 MED ORDER — VANCOMYCIN HCL 10 G IV SOLR
1250.0000 mg | Freq: Once | INTRAVENOUS | Status: AC
  Administered 2013-01-22: 1250 mg via INTRAVENOUS
  Filled 2013-01-22: qty 1250

## 2013-01-22 MED ORDER — INSULIN ASPART 100 UNIT/ML ~~LOC~~ SOLN
0.0000 [IU] | Freq: Three times a day (TID) | SUBCUTANEOUS | Status: DC
  Administered 2013-01-23: 2 [IU] via SUBCUTANEOUS
  Administered 2013-01-23 (×2): 1 [IU] via SUBCUTANEOUS
  Administered 2013-01-24: 2 [IU] via SUBCUTANEOUS
  Administered 2013-01-24: 1 [IU] via SUBCUTANEOUS
  Administered 2013-01-26 – 2013-01-27 (×3): 2 [IU] via SUBCUTANEOUS
  Administered 2013-01-28 – 2013-01-31 (×5): 1 [IU] via SUBCUTANEOUS

## 2013-01-22 MED ORDER — VANCOMYCIN HCL IN DEXTROSE 1-5 GM/200ML-% IV SOLN
1000.0000 mg | INTRAVENOUS | Status: DC
  Administered 2013-01-23 – 2013-01-26 (×4): 1000 mg via INTRAVENOUS
  Filled 2013-01-22 (×4): qty 200

## 2013-01-22 MED ORDER — FUROSEMIDE 10 MG/ML IJ SOLN
20.0000 mg | Freq: Once | INTRAMUSCULAR | Status: AC
  Administered 2013-01-22: 20 mg via INTRAVENOUS

## 2013-01-22 MED ORDER — LEVOTHYROXINE SODIUM 75 MCG PO TABS
75.0000 ug | ORAL_TABLET | Freq: Every day | ORAL | Status: DC
  Administered 2013-01-23 – 2013-01-31 (×9): 75 ug via ORAL
  Filled 2013-01-22 (×10): qty 1

## 2013-01-22 MED ORDER — FUROSEMIDE 10 MG/ML IJ SOLN
INTRAMUSCULAR | Status: AC
  Filled 2013-01-22: qty 4

## 2013-01-22 NOTE — Progress Notes (Signed)
Subjective: 5 Days Post-Op Procedure(s) (LRB): INTRAMEDULLARY (IM) RETROGRADE FEMORAL NAILING (Right) HARDWARE REMOVAL OF RIGHT DHS (Right) ARTHROPLASTY BIPOLAR HIP (Left) Patient reports pain as 2 on 0-10 scale.    Objective: Vital signs in last 24 hours: Temp:  [97.3 F (36.3 C)-99.3 F (37.4 C)] 99.3 F (37.4 C) (07/19 1549) Pulse Rate:  [65-87] 81 (07/19 1500) Resp:  [11-26] 12 (07/19 1500) BP: (90-129)/(39-75) 111/48 mmHg (07/19 1400) SpO2:  [91 %-100 %] 98 % (07/19 1500) FiO2 (%):  [3 %] 3 % (07/18 2019)  Intake/Output from previous day: 07/18 0701 - 07/19 0700 In: 4010 [P.O.:360; I.V.:3600; IV Piggyback:50] Out: 775 [Urine:775] Intake/Output this shift: Total I/O In: 605 [P.O.:360; I.V.:195; IV Piggyback:50] Out: -    Recent Labs  01/20/13 0335 01/20/13 2245 01/21/13 0920  HGB 7.4* 8.6* 8.7*    Recent Labs  01/20/13 2245 01/21/13 0920  WBC 6.0 6.0  RBC 2.87* 2.94*  HCT 25.0* 25.7*  PLT 41* 52*    Recent Labs  01/21/13 0550 01/22/13 0400  NA 141 142  K 4.9 4.3  CL 114* 117*  CO2 15* 16*  BUN 37* 37*  CREATININE 1.49* 1.34*  GLUCOSE 120* 112*  CALCIUM 8.1* 8.0*    Recent Labs  01/21/13 0550 01/22/13 0400  INR 1.22 1.37    Incision: scant drainage Patient has significant body edema  Assessment/Plan: 5 Days Post-Op Procedure(s) (LRB): INTRAMEDULLARY (IM) RETROGRADE FEMORAL NAILING (Right) HARDWARE REMOVAL OF RIGHT DHS (Right) ARTHROPLASTY BIPOLAR HIP (Left) Medical Care per Dr Arnoldo Lenis J 01/22/2013, 4:57 PM

## 2013-01-22 NOTE — Progress Notes (Signed)
[facility] PROGRESS NOTE  [name]:[id number] DOB: 1922-11-21 DOA: 01/16/2013 PCP: Toma Deiters, MD  Assessment/Plan: Active Problems:   HYPOTHYROIDISM   DEMENTIA   GERD   Cryptogenic cirrhosis   Fall   Femur fracture, right   Fracture of femoral neck, left   Paroxysmal atrial fibrillation   Thrombocytopenia   Diabetes mellitus type 2, controlled   Acute renal failure   Anemia   E. coli and Proteus UTI (urinary tract infection)   Acute respiratory failure   Encephalopathy acute   Severe sepsis with septic shock    Brief narrative:  79 y.o. Caucasian female with history of cryptogenic cirrhosis, thrombocytopenia, GERD, achalasia, history of aspiration pneumonia in July of 2013 at [facility], dementia, hypothyroidism, hypertension, and depression who presents with the above complaints. Patient provided most of the history. She reported that after her lunch at around 130 p.m. an aide was helping out at the chair. She reported that the aides legs and her legs got tangled up and she had a mechanical fall. She immediately had right leg and left hip pain. She was brought to the emergency department and was found to have right distal femur fracture and left femoral subcapital fracture.  After operative repair/ R femoral nailing, L arthroplasty brought to 2300 ICU intubated and PCCM assumed care.She was extubated within 24 hours and watched in the unit an additional 24 hours before transferring out to the SDU.  Assessment/Plan:  Active Problems:  Fall with the following injuries:  A) Femur fracture, right  B) Fracture of femoral neck, left  -per ortho- stable    Severe sepsis with septic shock due to:  E. coli and Proteus UTI (urinary tract infection)  -initially felt to have non infectious pyuria but cx this admit has revealed both E. Coli and proteus  -empiric Levaquin was dc'd 7/15  -E. Coli resistant to fluoroquinolones/Proteus senistive to  Rocephin  -blood cx's pending  -supportive care with hydration  -DNR so would not use pressors  -stress dose steroids initiated overnite into 7/17 , start tapering  Acute renal failure  Improving , DC IVF due to sob  -baseline is 20/1.11  Anemia/? Intraoperative blood loss  -since was hypotensive was given 1 unit PRBC's 7/17   -RN was unable to contact guardian to obtain blood consent- pt is symptomatic with recent hypotension so she meets requirement to proceed with transfusion due to medical necessity    HYPOTHYROIDISM  -cont Synthroid  -last documented TSH was 2012 but all OP records not available so since no acute indication to check will defer for now    Cryptogenic cirrhosis/ Thrombocytopenia  -TB now and baseline <2.0  -Mild elevation AST intially likely low perfusion from [facility]  -baseline platelets ~80-90,000 but now in the 30's  -?? Lower platelets due to blood loss vs GN sepsis so will follow trends- may have mild DIC  -Too lethargic to take PO    Paroxysmal atrial fibrillation  -maintaining NSR  Diabetes mellitus type 2, controlled  -cont SSI  Acute post op respiratory failure  -still requiring 4L  oxygen and CXR 7/18 worse so suspect aspiration due to recent AMS  -cont supportive care and current anbxs- may need to broaden coverage  Will add vancomycin to cover for HCAP  -DNR so would not pursue intubation    Encephalopathy acute/ DEMENTIA  -multifactorial due to severe infection/sepsis and hypotension  -resolving- more alert 7/18  -Ammonia 48   GERD  -PPI but  cautiously since on anbx's and incr risk for developing C. diff   DVT prophylaxis: SCDs  Code Status: DO NOT RESUSCITATE   Family Communication: Patient has a son but a legal guardian manages her clinical decisions: Pt's guardian, Barnie Alderman [phone]) with DSS [employer].  Disposition Plan: Remain in step down    Isolation: None  Nutritional Status: Acute protein calorie  malnutrition related to critical illness in setting of chronic protein calorie malnutrition related to ongoing dementia  Consultants:  Orthopedic  PCCM signed off  Procedures:  Right intramedullary femoral nailing, removal of hardware on right, left hip arthroplasty Dr. Carola Frost 01/17/13   Antibiotics:  Levaquin 7/14 >>> 7/15  Vancomycin 7/14 >>> 7/15  Rocephin 7/17 >>>   HPI/Subjective:  Arousable but confused. No family at bedside  Objective: Filed Vitals:   01/21/13 1200 01/21/13 2019 01/22/13 0024 01/22/13 0413  BP: 117/40 129/41 102/75 120/52  Pulse: 84 87 79 75  Temp: 97.9 F (36.6 C) 97.9 F (36.6 C) 97.8 F (36.6 C) 97.3 F (36.3 C)  TempSrc: Oral Oral Oral Axillary  Resp: 21 21 15 18   Height:      Weight:      SpO2: 95% 93% 96% 95%    Intake/Output Summary (Last 24 hours) at 01/22/13 0816 Last data filed at 01/22/13 0600  Gross per 24 hour  Intake   3710 ml  Output    775 ml  Net   2935 ml    Exam:  General: No acute respiratory distress but still requiring oxygen through a Ventimask  Lungs: Coarse to auscultation bilaterally without wheezes but a few fine crackles in the bases, 35% Ventimask  Cardiovascular: Regular rate and rhythm without murmur gallop or rub normal S1 and S2, no peripheral edema or JVD  Abdomen: Suprapubic tender with palpation, nondistended, soft, hypoactive bowel sounds positive, no rebound, no ascites, no appreciable mass  Musculoskeletal: No significant cyanosis, clubbing of bilateral lower extremities  Neurological: Alert and oriented to name only, MOE x 4 and no focal deficits appreciated     Data Reviewed: Basic Metabolic Panel:  Recent Labs Lab 01/18/13 1420 01/19/13 0700 01/20/13 0335 01/21/13 0550 01/22/13 0400  NA 144 142 139 141 142  K 4.7 4.7 4.4 4.9 4.3  CL 117* 117* 115* 114* 117*  CO2 18* 16* 19 15* 16*  GLUCOSE 129* 129* 113* 120* 112*  BUN 34* 36* 36* 37* 37*  CREATININE 1.52* 1.58* 1.45* 1.49* 1.34*   CALCIUM 8.2* 8.3* 7.8* 8.1* 8.0*    Liver Function Tests:  Recent Labs Lab 01/16/13 1752 01/17/13 2225 01/21/13 0550  AST 46* 36 42*  ALT 24 18 24   ALKPHOS 199* 119* 101  BILITOT 1.1 1.7* 1.5*  PROT 6.7 5.0* 5.4*  ALBUMIN 2.9* 2.4* 2.3*   No results found for this basename: LIPASE, AMYLASE,  in the last 168 hours  Recent Labs Lab 01/20/13 1700  AMMONIA 48    CBC:  Recent Labs Lab 01/16/13 1752  01/18/13 1420 01/19/13 0700 01/20/13 0335 01/20/13 2245 01/21/13 0920  WBC 5.5  < > 8.7 8.0 6.1 6.0 6.0  NEUTROABS 4.3  --   --   --   --   --   --   HGB 11.0*  < > 8.4* 8.3* 7.4* 8.6* 8.7*  HCT 33.3*  < > 24.9* 25.4* 21.9* 25.0* 25.7*  MCV 86.9  < > 85.9 87.6 86.6 87.1 87.4  PLT 68*  < > 35* 34* 32* 41*  52*  < > = values in this interval not displayed.  Cardiac Enzymes:  Recent Labs Lab 01/20/13 0539 01/20/13 1339 01/20/13 2245  TROPONINI <0.30 <0.30 <0.30   BNP (last 3 results) No results found for this basename: PROBNP,  in the last 8760 hours   CBG:  Recent Labs Lab 01/21/13 1558 01/21/13 2018 01/22/13 0004 01/22/13 0407 01/22/13 0806  GLUCAP 144* 124* 125* 115* 107*    Recent Results (from the past 240 hour(s))  SURGICAL PCR SCREEN     Status: None   Collection Time    01/17/13  3:58 AM      Result Value Range Status   MRSA, PCR NEGATIVE  NEGATIVE Final   Staphylococcus aureus NEGATIVE  NEGATIVE Final   Comment:            The Xpert SA Assay (FDA     approved for NASAL specimens     in patients over 67 years of age),     is one component of     a comprehensive surveillance     program.  Test performance has     been validated by [employer] for patients greater     than or equal to 79 year old.     It is not intended     to diagnose infection nor to     guide or monitor treatment.  URINE CULTURE     Status: None   Collection Time    01/17/13  4:30 AM      Result Value Range Status   Specimen Description URINE, CATHETERIZED    Final   Special Requests Normal   Final   Culture  Setup Time 01/17/2013 05:39   Final   Colony Count >=100,000 COLONIES/ML   Final   Culture     Final   Value: ESCHERICHIA COLI     PROTEUS MIRABILIS   Report Status 01/21/2013 FINAL   Final   Organism ID, Bacteria ESCHERICHIA COLI   Final   Organism ID, Bacteria PROTEUS MIRABILIS   Final  CULTURE, BLOOD (ROUTINE X 2)     Status: None   Collection Time    01/20/13 10:30 AM      Result Value Range Status   Specimen Description BLOOD ARM RIGHT   Final   Special Requests BOTTLES DRAWN AEROBIC ONLY 5.0CC   Final   Culture  Setup Time 01/20/2013 16:42   Final   Culture     Final   Value:        BLOOD CULTURE RECEIVED NO GROWTH TO DATE CULTURE WILL BE HELD FOR 5 DAYS BEFORE ISSUING A FINAL NEGATIVE REPORT   Report Status PENDING   Incomplete  CULTURE, BLOOD (ROUTINE X 2)     Status: None   Collection Time    01/20/13 10:40 AM      Result Value Range Status   Specimen Description BLOOD HAND RIGHT   Final   Special Requests BOTTLES DRAWN AEROBIC ONLY 3.0CC   Final   Culture  Setup Time 01/20/2013 16:42   Final   Culture     Final   Value:        BLOOD CULTURE RECEIVED NO GROWTH TO DATE CULTURE WILL BE HELD FOR 5 DAYS BEFORE ISSUING A FINAL NEGATIVE REPORT   Report Status PENDING   Incomplete     Studies: Dg Chest 1 View  01/16/2013   *RADIOLOGY REPORT*  Clinical Data: Fall  CHEST - 1  VIEW  Comparison: 02/01/2012  Findings: Single view of the chest was obtained.  No evidence for a pneumothorax.  Stable appearance of the heart and mediastinum. Streaky densities in the left upper lung could represent overlying bones and lung markings.  Otherwise, there is no focal airspace disease.  Degenerative changes in the thoracic spine.  IMPRESSION: Streaky densities in the left upper lung may represent overlying shadows and chronic changes.  Findings are nonspecific.   Original Report Authenticated By: Richarda Overlie, M.D.   Dg Hip Complete  Left  01/16/2013   *RADIOLOGY REPORT*  Clinical Data: Fall and suspected hip fracture.  LEFT HIP - COMPLETE 2+ VIEW  Comparison: 04/18/2011 and 04/23/2011  Findings: There is a new fracture involving the left proximal femur.  Findings are most compatible with a subcapital fracture. The distal aspect is superiorly displaced.  Again noted is internal fixation of the proximal right femur from previous fracture. Pelvic bony ring appears to be intact.  The left hip is located.  IMPRESSION: Displaced left femoral subcapital fracture.   Original Report Authenticated By: Richarda Overlie, M.D.   Dg Femur Right  01/17/2013   *RADIOLOGY REPORT*  Clinical Data: 79 year old female undergoing ORIF distal right femur.  DG C-ARM 61-120 MIN, RIGHT FEMUR - 2 VIEW  Comparison: 01/16/2013.  Fluoroscopy time:  1 minute 0 seconds  Findings: Four intraoperative fluoroscopic views demonstrating addition of a right femur intramedullary rod to the preexisting proximal right femur dynamic hip screw.  Proximal and distal interlocking hardware.  The rod traverses the comminuted spiral distal femur fracture.  Improved alignment of fracture fragment. Hardware appears intact.  IMPRESSION: ORIF right femur with no adverse features identified.   Original Report Authenticated By: Erskine Speed, M.D.   Dg Femur Right  01/16/2013   *RADIOLOGY REPORT*  Clinical Data: Fall and suspected right femur fracture.  RIGHT FEMUR - 2 VIEW  Comparison: 04/23/2011 and 01/28/2012  Findings: Dynamic hip screw in the proximal right femur.  There is irregularity of the right femoral neck and right femoral trochanters.  This is probably from the previous injury and appears unchanged from 01/28/2012.  There is a new displaced oblique fracture in the distal right femur.  Fracture appears be mildly comminuted.  Fracture is medially displaced.  The right hip is located.  Fracture is in the region of the distal diaphysis. Probable suprapatellar joint effusion.  IMPRESSION:  Displaced comminuted fracture of the distal right femur.   Original Report Authenticated By: Richarda Overlie, M.D.   Ct Knee Right Wo Contrast  01/16/2013   *RADIOLOGY REPORT*  Clinical Data: Status post fall.  Right femur fracture.  CT OF THE RIGHT KNEE WITHOUT CONTRAST  Technique:  Multidetector CT imaging was performed according to the standard protocol. Multiplanar CT image reconstructions were also generated.  Comparison: Plain films earlier this same date.  Findings: The patient has a highly comminuted fracture of the distal diaphysis of the right femur.  The fracture is oblique in orientation originating 12.5 cm superior to the medial femoral condyle and extending in an inferior and oblique orientation through the lateral cortex of the diaphysis of the distal femur 5 cm above the lateral condyle.  There is fragment override of up to 3 cm.  There is one half shaft width medial displacement and fragment override about the 3 cm.  The fracture does not extend to the knee joint. No other fracture is identified.  There is no knee joint effusion.  The patient has osteoarthritis about  the knee. Soft tissue structures demonstrate hematoma about the patient's fracture.  IMPRESSION: Highly comminuted fracture of the distal diaphysis of the right femur as described.  The fracture does not involve the knee joint.   Original Report Authenticated By: Holley Dexter, M.D.   Dg Pelvis Portable  01/18/2013   *RADIOLOGY REPORT*  Clinical Data: Postoperative radiograph; status post right femoral intramedullary rod placement and left hip arthroplasty.  PORTABLE PELVIS  Comparison: Intraoperative radiograph performed earlier today at 05:27 p.m., and right femur radiographs performed 01/16/2013  Findings: There has been interval placement of an intramedullary rod within the right femur, and left hip arthroplasty.  No new fractures are seen. The prior right hip screw is unchanged in appearance.  Overlying postoperative change and  soft tissue air are seen.  The visualized bowel gas pattern is grossly unremarkable.  IMPRESSION: Status post placement of intramedullary rod within the right femur, and left hip arthroplasty.  No new fractures seen at the pelvis.   Original Report Authenticated By: Tonia Ghent, M.D.   Dg Chest Port 1 View  01/21/2013   *RADIOLOGY REPORT*  Clinical Data: Shortness of breath, hypoxia  PORTABLE CHEST - 1 VIEW  Comparison: Portable exam 1008 hours compared to 01/20/2013  Findings: Right jugular central venous catheter tip projects over SVC. Enlargement of cardiac silhouette. Stable mediastinal contours. Prominent hila particularly on the left. Peribronchial thickening with perihilar infiltrates increased since previous exam, extending in the bases, question developing pulmonary edema or infection. Small basilar effusions not excluded. No pneumothorax or acute osseous finding.  IMPRESSION: Increasing perihilar and basilar infiltrates question edema versus infection. Enlargement of cardiac silhouette.   Original Report Authenticated By: Ulyses Southward, M.D.   Dg Chest Port 1 View  01/20/2013   *RADIOLOGY REPORT*  Clinical Data: Progressive hypoxia  PORTABLE CHEST - 1 VIEW  Comparison: Portable exam 0749 hours compared to 01/18/2013  Findings: Rotated to the right. Interval removal of endotracheal and nasogastric tubes. Left jugular line stable, tip projecting over SVC. Mild enlargement of cardiac silhouette. Mediastinal contours and pulmonary vascularity normal. Mild bibasilar atelectasis or infiltrate. Remaining lungs clear. Calcified granuloma left mid lung stable. No definite pleural effusion or pneumothorax.  IMPRESSION: Persistent mild bibasilar atelectasis or infiltrate.   Original Report Authenticated By: Ulyses Southward, M.D.   Dg Chest Port 1 View  01/18/2013   *RADIOLOGY REPORT*  Clinical Data: Respiratory difficulty  PORTABLE CHEST - 1 VIEW  Comparison: Yesterday  Findings: Endotracheal tube has retracted.   Valve the tip is 2.2 cm from the carina.  Stable left internal jugular central venous catheter.  NG tube placed beyond the gastroesophageal junction. Heart is enlarged.  Vascular congestion without edema has increased.  Left basilar opacity has developed compatible with atelectasis verses airspace disease.  Small left pleural effusion is not excluded.  IMPRESSION: Increasing vascular congestion.  Increasing left lower lobe atelectasis verses airspace disease. Left pleural effusion is not excluded.   Original Report Authenticated By: Jolaine Click, M.D.   Dg Chest Port 1 View  01/18/2013   *RADIOLOGY REPORT*  Clinical Data: Postoperative radiograph; status post central line placement.  PORTABLE CHEST - 1 VIEW  Comparison: Chest radiograph performed 01/16/2013  Findings: There has been placement of a left IJ line, noted ending about the proximal to mid SVC.  The patient's endotracheal tube is seen ending 1 cm above the carina; this should be retracted 1-2 cm.  The lungs are relatively well expanded.  Vascular congestion is again noted.  Left basilar  airspace opacity may reflect atelectasis.  Mild interstitial edema cannot be excluded.  A small left pleural effusion may be present.  No pneumothorax is seen.  The cardiomediastinal silhouette is mildly enlarged.  No acute osseous abnormalities are identified.  IMPRESSION:  1.  Left IJ line noted ending about the proximal to mid SVC. 2.  Endotracheal tube seen ending 1 cm above the carina; this should be retracted 1-2 cm. 3.  Left basilar airspace opacity may reflect atelectasis. Vascular congestion and mild cardiomegaly noted, with mildly increased interstitial markings, possibly reflecting mild interstitial edema.  Question of small left pleural effusion.  These results were called by telephone on 01/18/2013 at 02:31 a.m. to Nursing on [id number], who verbally acknowledged these results.   Original Report Authenticated By: Tonia Ghent, M.D.   Dg Shoulder Right  Port  01/17/2013   *RADIOLOGY REPORT*  Clinical Data: Pain after fall  PORTABLE RIGHT SHOULDER - 2+ VIEW  Comparison: None.  Findings: Three views of the right shoulder submitted.  No acute fracture or subluxation.  Mild degenerative changes right AC joint. Diffuse osteopenia.  IMPRESSION: No acute fracture or subluxation.  Mild degenerative changes right AC joint.   Original Report Authenticated By: Natasha Mead, M.D.   Dg Hip Portable 1 View Left  01/18/2013   *RADIOLOGY REPORT*  Clinical Data: Postop.  PORTABLE LEFT HIP - 1 VIEW  Comparison: 01/16/2013 and 01/17/2013  Findings: There is a left hip arthroplasty.  The left hip appears to be located on this cross-table lateral view.  No evidence for a periprosthetic fracture.  IMPRESSION: Left hip replacement without complicating features.   Original Report Authenticated By: Richarda Overlie, M.D.   Dg Femur Right Port  01/18/2013   *RADIOLOGY REPORT*  Clinical Data: Postop for femur fracture.  PORTABLE RIGHT FEMUR - 2 VIEW  Comparison: 01/16/2013  Findings: A retrograde intramedullary nail has been placed.  There are four distal interlocking screws.  There is decreased displacement of the distal femur fracture.  Again noted is the old fixation device extending through the femoral head and neck.  IMPRESSION: Internal fixation of the distal femur fracture with a retrograde nail.   Original Report Authenticated By: Richarda Overlie, M.D.   Dg Knee Left Port  01/17/2013   *RADIOLOGY REPORT*  Clinical Data: Fall.  Pain.  Distal right femur fracture.  Left hip fracture.  PORTABLE LEFT KNEE - 1-2 VIEW  Comparison: None.  Findings: The left knee is located.  Mild degenerative changes are again noted.  A small effusion is evident.  Mild osteopenia is noted.  Superficial soft tissue swelling is noted laterally.  IMPRESSION:  1.  Small joint effusion without to an acute osseous abnormality. 2.  Mild degenerative changes. 3.  Superficial soft tissue swelling over the lateral aspect  of the left knee.   Original Report Authenticated By: Marin Roberts, M.D.   Dg Ankle Right Port  01/17/2013   *RADIOLOGY REPORT*  Clinical Data: Fall.  Right ankle pain.  PORTABLE RIGHT ANKLE - 2 VIEW  Comparison: Right ankle radiographs 07/31/2005.  Findings: Extensive bimalleolar soft tissue swelling is present. Mild osteopenia is noted.  No acute osseous abnormality is present. The ankle is located.  A small bone fragment associated with the medial malleolus is stable.  IMPRESSION:  1.  Diffuse edematous changes about the ankle. 2.  No acute osseous abnormality. 3.  Moderate generalized osteopenia. 4.  Remote corticated bone fragment associated with the medial malleolus.   Original Report Authenticated By: Cristal Deer  Alfredo Batty, M.D.   Dg C-arm 61-120 Min  01/17/2013   *RADIOLOGY REPORT*  Clinical Data: 79 year old female undergoing ORIF distal right femur.  DG C-ARM 61-120 MIN, RIGHT FEMUR - 2 VIEW  Comparison: 01/16/2013.  Fluoroscopy time:  1 minute 0 seconds  Findings: Four intraoperative fluoroscopic views demonstrating addition of a right femur intramedullary rod to the preexisting proximal right femur dynamic hip screw.  Proximal and distal interlocking hardware.  The rod traverses the comminuted spiral distal femur fracture.  Improved alignment of fracture fragment. Hardware appears intact.  IMPRESSION: ORIF right femur with no adverse features identified.   Original Report Authenticated By: Erskine Speed, M.D.    Scheduled Meds: . antiseptic oral rinse  1 application Mouth Rinse QID  . cefTRIAXone (ROCEPHIN)  IV  1 g Intravenous Q24H  . chlorhexidine  15 mL Mouth/Throat BID  . feeding supplement  237 mL Oral BID BM  . hydrocortisone sod succinate (SOLU-CORTEF) inj  50 mg Intravenous Q6H  . insulin aspart  0-9 Units Subcutaneous Q4H  . levothyroxine  37.5 mcg Intravenous QAC breakfast  . pantoprazole (PROTONIX) IV  40 mg Intravenous Q24H   Continuous Infusions: . sodium chloride 150  mL/hr at 01/22/13 0543    Active Problems:   HYPOTHYROIDISM   DEMENTIA   GERD   Cryptogenic cirrhosis   Fall   Femur fracture, right   Fracture of femoral neck, left   Paroxysmal atrial fibrillation   Thrombocytopenia   Diabetes mellitus type 2, controlled   Acute renal failure   Anemia   E. coli and Proteus UTI (urinary tract infection)   Acute respiratory failure   Encephalopathy acute   Severe sepsis with septic shock    Time spent: 40 minutes   [facility]  [facility] Pager [phone]. If 8PM-8AM, please contact night-coverage at [email or web address], password [facility] 01/22/2013, 8:16 AM  LOS: 6 days

## 2013-01-22 NOTE — Progress Notes (Signed)
ANTIBIOTIC CONSULT NOTE - FOLLOW UP  Pharmacy Consult for vancomycin Indication: HCAP  Allergies  Allergen Reactions  . Aspirin     hives  . Codeine     REACTION: UNKNOWN REACTION  . Morphine And Related Other (See Comments)    "completely knocks her out"  . Penicillins   . Sulfonamide Derivatives     REACTION: UNKNOWN REACTION  . Zolpidem Tartrate     REACTION: UNKNOWN REACTION    Patient Measurements: Height: 5' (152.4 cm) Weight: 171 lb 15.3 oz (78 kg) IBW/kg (Calculated) : 45.5  Vital Signs: Temp: 97.3 F (36.3 C) (07/19 0800) Temp src: Oral (07/19 0800) BP: 90/39 mmHg (07/19 0800) Pulse Rate: 79 (07/19 0900) Intake/Output from previous day: 07/18 0701 - 07/19 0700 In: 4010 [P.O.:360; I.V.:3600; IV Piggyback:50] Out: 775 [Urine:775] Intake/Output from this shift: Total I/O In: 215 [P.O.:120; I.V.:95] Out: -   Labs:  Recent Labs  01/20/13 0335 01/20/13 2245 01/21/13 0550 01/21/13 0920 01/22/13 0400  WBC 6.1 6.0  --  6.0  --   HGB 7.4* 8.6*  --  8.7*  --   PLT 32* 41*  --  52*  --   CREATININE 1.45*  --  1.49*  --  1.34*   Estimated Creatinine Clearance: 27.3 ml/min (by C-G formula based on Cr of 1.34). No results found for this basename: VANCOTROUGH, Leodis Binet, VANCORANDOM, GENTTROUGH, GENTPEAK, GENTRANDOM, TOBRATROUGH, TOBRAPEAK, TOBRARND, AMIKACINPEAK, AMIKACINTROU, AMIKACIN,  in the last 72 hours     Assessment: 79 yo female with worsening CXR on rocephin for UTI and adding vancomycin for concern of HCAP.  SCr= 1.34 and CrCl ~ 30.  7/14 levaquin>> 7/15 7/17 rocephin>> 7/19 vancomycin>>  7/17 blood x2 7/14 urine: e. Coli, p. Mirabilis (both sensitive to rocephin)  Goal of Therapy:  Vancomycin trough level 15-20 mcg/ml  Plan:  -Vancomycin 1250mg  IV x1 followed by 1000mg  IV q24h -Will follow renal function, cultures and vancomycin levels as needed  Harland German, Pharm D 01/22/2013 9:41 AM

## 2013-01-23 ENCOUNTER — Inpatient Hospital Stay (HOSPITAL_COMMUNITY): Payer: PRIVATE HEALTH INSURANCE

## 2013-01-23 LAB — GLUCOSE, CAPILLARY
Glucose-Capillary: 125 mg/dL — ABNORMAL HIGH (ref 70–99)
Glucose-Capillary: 169 mg/dL — ABNORMAL HIGH (ref 70–99)

## 2013-01-23 MED ORDER — BIOTENE DRY MOUTH MT LIQD
15.0000 mL | Freq: Two times a day (BID) | OROMUCOSAL | Status: DC
  Administered 2013-01-23 – 2013-01-31 (×15): 15 mL via OROMUCOSAL

## 2013-01-23 MED ORDER — FUROSEMIDE 10 MG/ML IJ SOLN
20.0000 mg | Freq: Once | INTRAMUSCULAR | Status: AC
  Administered 2013-01-23: 20 mg via INTRAVENOUS

## 2013-01-23 MED ORDER — FUROSEMIDE 10 MG/ML IJ SOLN
INTRAMUSCULAR | Status: AC
  Filled 2013-01-23: qty 4

## 2013-01-23 MED ORDER — HYDROCORTISONE SOD SUCCINATE 100 MG IJ SOLR
25.0000 mg | Freq: Two times a day (BID) | INTRAMUSCULAR | Status: DC
  Administered 2013-01-23 – 2013-01-24 (×2): 25 mg via INTRAVENOUS
  Filled 2013-01-23 (×4): qty 0.5

## 2013-01-23 NOTE — Progress Notes (Signed)
[facility] PROGRESS NOTE  MARIESHA VENTURELLA ZOX:[id number] DOB: 19-Jun-1923 DOA: 01/16/2013 PCP: Toma Deiters, MD  Assessment/Plan: Active Problems:   HYPOTHYROIDISM   DEMENTIA   GERD   Cryptogenic cirrhosis   Fall   Femur fracture, right   Fracture of femoral neck, left   Paroxysmal atrial fibrillation   Thrombocytopenia   Diabetes mellitus type 2, controlled   Acute renal failure   Anemia   E. coli and Proteus UTI (urinary tract infection)   Acute respiratory failure   Encephalopathy acute   Severe sepsis with septic shock   Brief narrative:  79 y.o. Caucasian female with history of cryptogenic cirrhosis, thrombocytopenia, GERD, achalasia, history of aspiration pneumonia in July of 2013 at [facility], dementia, hypothyroidism, hypertension, and depression who presents with the above complaints. Patient provided most of the history. She reported that after her lunch at around 130 p.m. an aide was helping out at the chair. She reported that the aides legs and her legs got tangled up and she had a mechanical fall. She immediately had right leg and left hip pain. She was brought to the emergency department and was found to have right distal femur fracture and left femoral subcapital fracture.  After operative repair/ R femoral nailing, L arthroplasty brought to 2300 ICU intubated and PCCM assumed care.She was extubated within 24 hours and watched in the unit an additional 24 hours before transferring out to the SDU.   Assessment/Plan:  Active Problems:  Fall with the following injuries:  A) Femur fracture, right  B) Fracture of femoral neck, left  -per ortho- stable    Severe sepsis with septic shock due to:  E. coli and Proteus UTI (urinary tract infection)  -initially felt to have non infectious pyuria but cx this admit has revealed both E. Coli and proteus  -empiric Levaquin was dc'd 7/15  -E. Coli resistant to fluoroquinolones/Proteus senistive to  Rocephin  -blood cx's pending  -supportive care with hydration  -DNR so would not use pressors  -stress dose steroids initiated overnite into 7/17 , start tapering   Acute renal failure  Improving , DC IVF due to sob  -baseline is 20/1.11   Anemia/? Intraoperative blood loss  -since was hypotensive was given 1 unit PRBC's 7/17 , hemoglobin stable since, will repeat CBC -RN was unable to contact guardian to obtain blood consent- pt is symptomatic with recent hypotension so she meets requirement to proceed with transfusion due to medical necessity    HYPOTHYROIDISM  -cont Synthroid  -last documented TSH was 2012 but all OP records not available so since no acute indication to check will defer for now   Cryptogenic cirrhosis/ Thrombocytopenia  -TB now and baseline <2.0  -Mild elevation AST intially likely low perfusion from [facility]  -baseline platelets ~80-90,000 but now in the 30's  -?? Lower platelets due to blood loss vs GN sepsis so will follow trends- may have mild DIC       Paroxysmal atrial fibrillation  -maintaining NSR   Diabetes mellitus type 2, controlled  -cont SSI   Acute post op respiratory failure  -still requiring 4L oxygen and CXR 7/18 worse so suspect aspiration due to recent AMS  -cont supportive care and current anbxs- may need to broaden coverage  Will add vancomycin to cover for HCAP  -DNR so would not pursue intubation  Repeat chest x-ray Gentle diuresis with Lasix to achieve negative fluid balance Repeat Lasix today 20 mg  Encephalopathy acute/ DEMENTIA  -multifactorial  due to severe infection/sepsis and hypotension  -resolving- more alert 7/18  -Ammonia 48    GERD  -PPI but cautiously since on anbx's and incr risk for developing C. diff   DVT prophylaxis: SCDs  Code Status: DO NOT RESUSCITATE  Family Communication: Patient has a son but a legal guardian manages her clinical decisions: Pt's guardian, Barnie Alderman [phone]) with DSS  [employer].  Disposition Plan: Remain in step down    Isolation: None  Nutritional Status: Acute protein calorie malnutrition related to critical illness in setting of chronic protein calorie malnutrition related to ongoing dementia  Consultants:  Orthopedic  PCCM signed off  Procedures:  Right intramedullary femoral nailing, removal of hardware on right, left hip arthroplasty Dr. Carola Frost 01/17/13  Antibiotics:  Levaquin 7/14 >>> 7/15  Vancomycin 7/14 >>> 7/15  Rocephin 7/17 >>>  HPI/Subjective:  Arousable but confused. No family at bedside  Objective: Filed Vitals:   01/22/13 1800 01/22/13 2029 01/23/13 0033 01/23/13 0413  BP: 129/39 122/54 110/45 125/50  Pulse: 67 75 79 64  Temp:  97.5 F (36.4 C) 97.7 F (36.5 C) 97.5 F (36.4 C)  TempSrc:  Oral Oral Axillary  Resp:  15 18 11   Height:      Weight:      SpO2: 97% 98% 96% 98%    Intake/Output Summary (Last 24 hours) at 01/23/13 0823 Last data filed at 01/23/13 0600  Gross per 24 hour  Intake    730 ml  Output   2400 ml  Net  -1670 ml    Exam:  HENT:  Head: Atraumatic.  Nose: Nose normal.  Mouth/Throat: Oropharynx is clear and moist.  Eyes: Conjunctivae are normal. Pupils are equal, round, and reactive to light. No scleral icterus.  Neck: Neck supple. No tracheal deviation present.  Cardiovascular: Normal rate, regular rhythm, normal heart sounds and intact distal pulses.  Pulmonary/Chest: Effort normal and breath sounds normal. No respiratory distress.  Abdominal: Soft. Normal appearance and bowel sounds are normal. She exhibits no distension. There is no tenderness.  Musculoskeletal: She exhibits no edema and no tenderness.  Neurological: She is alert. No cranial nerve deficit.    Data Reviewed: Basic Metabolic Panel:  Recent Labs Lab 01/18/13 1420 01/19/13 0700 01/20/13 0335 01/21/13 0550 01/22/13 0400  NA 144 142 139 141 142  K 4.7 4.7 4.4 4.9 4.3  CL 117* 117* 115* 114* 117*  CO2 18* 16*  19 15* 16*  GLUCOSE 129* 129* 113* 120* 112*  BUN 34* 36* 36* 37* 37*  CREATININE 1.52* 1.58* 1.45* 1.49* 1.34*  CALCIUM 8.2* 8.3* 7.8* 8.1* 8.0*    Liver Function Tests:  Recent Labs Lab 01/16/13 1752 01/17/13 2225 01/21/13 0550  AST 46* 36 42*  ALT 24 18 24   ALKPHOS 199* 119* 101  BILITOT 1.1 1.7* 1.5*  PROT 6.7 5.0* 5.4*  ALBUMIN 2.9* 2.4* 2.3*   No results found for this basename: LIPASE, AMYLASE,  in the last 168 hours  Recent Labs Lab 01/20/13 1700  AMMONIA 48    CBC:  Recent Labs Lab 01/16/13 1752  01/18/13 1420 01/19/13 0700 01/20/13 0335 01/20/13 2245 01/21/13 0920  WBC 5.5  < > 8.7 8.0 6.1 6.0 6.0  NEUTROABS 4.3  --   --   --   --   --   --   HGB 11.0*  < > 8.4* 8.3* 7.4* 8.6* 8.7*  HCT 33.3*  < > 24.9* 25.4* 21.9* 25.0* 25.7*  MCV 86.9  < >  85.9 87.6 86.6 87.1 87.4  PLT 68*  < > 35* 34* 32* 41* 52*  < > = values in this interval not displayed.  Cardiac Enzymes:  Recent Labs Lab 01/20/13 0539 01/20/13 1339 01/20/13 2245  TROPONINI <0.30 <0.30 <0.30   BNP (last 3 results) No results found for this basename: PROBNP,  in the last 8760 hours   CBG:  Recent Labs Lab 01/22/13 0407 01/22/13 0806 01/22/13 1126 01/22/13 1720 01/22/13 1958  GLUCAP 115* 107* 122* 124* 152*    Recent Results (from the past 240 hour(s))  SURGICAL PCR SCREEN     Status: None   Collection Time    01/17/13  3:58 AM      Result Value Range Status   MRSA, PCR NEGATIVE  NEGATIVE Final   Staphylococcus aureus NEGATIVE  NEGATIVE Final   Comment:            The Xpert SA Assay (FDA     approved for NASAL specimens     in patients over 55 years of age),     is one component of     a comprehensive surveillance     program.  Test performance has     been validated by [employer] for patients greater     than or equal to 24 year old.     It is not intended     to diagnose infection nor to     guide or monitor treatment.  URINE CULTURE     Status: None    Collection Time    01/17/13  4:30 AM      Result Value Range Status   Specimen Description URINE, CATHETERIZED   Final   Special Requests Normal   Final   Culture  Setup Time 01/17/2013 05:39   Final   Colony Count >=100,000 COLONIES/ML   Final   Culture     Final   Value: ESCHERICHIA COLI     PROTEUS MIRABILIS   Report Status 01/21/2013 FINAL   Final   Organism ID, Bacteria ESCHERICHIA COLI   Final   Organism ID, Bacteria PROTEUS MIRABILIS   Final  CULTURE, BLOOD (ROUTINE X 2)     Status: None   Collection Time    01/20/13 10:30 AM      Result Value Range Status   Specimen Description BLOOD ARM RIGHT   Final   Special Requests BOTTLES DRAWN AEROBIC ONLY 5.0CC   Final   Culture  Setup Time 01/20/2013 16:42   Final   Culture     Final   Value:        BLOOD CULTURE RECEIVED NO GROWTH TO DATE CULTURE WILL BE HELD FOR 5 DAYS BEFORE ISSUING A FINAL NEGATIVE REPORT   Report Status PENDING   Incomplete  CULTURE, BLOOD (ROUTINE X 2)     Status: None   Collection Time    01/20/13 10:40 AM      Result Value Range Status   Specimen Description BLOOD HAND RIGHT   Final   Special Requests BOTTLES DRAWN AEROBIC ONLY 3.0CC   Final   Culture  Setup Time 01/20/2013 16:42   Final   Culture     Final   Value:        BLOOD CULTURE RECEIVED NO GROWTH TO DATE CULTURE WILL BE HELD FOR 5 DAYS BEFORE ISSUING A FINAL NEGATIVE REPORT   Report Status PENDING   Incomplete     Studies: Dg Chest 1  View  01/16/2013   *RADIOLOGY REPORT*  Clinical Data: Fall  CHEST - 1 VIEW  Comparison: 02/01/2012  Findings: Single view of the chest was obtained.  No evidence for a pneumothorax.  Stable appearance of the heart and mediastinum. Streaky densities in the left upper lung could represent overlying bones and lung markings.  Otherwise, there is no focal airspace disease.  Degenerative changes in the thoracic spine.  IMPRESSION: Streaky densities in the left upper lung may represent overlying shadows and chronic changes.   Findings are nonspecific.   Original Report Authenticated By: Richarda Overlie, M.D.   Dg Hip Complete Left  01/16/2013   *RADIOLOGY REPORT*  Clinical Data: Fall and suspected hip fracture.  LEFT HIP - COMPLETE 2+ VIEW  Comparison: 04/18/2011 and 04/23/2011  Findings: There is a new fracture involving the left proximal femur.  Findings are most compatible with a subcapital fracture. The distal aspect is superiorly displaced.  Again noted is internal fixation of the proximal right femur from previous fracture. Pelvic bony ring appears to be intact.  The left hip is located.  IMPRESSION: Displaced left femoral subcapital fracture.   Original Report Authenticated By: Richarda Overlie, M.D.   Dg Femur Right  01/17/2013   *RADIOLOGY REPORT*  Clinical Data: 79 year old female undergoing ORIF distal right femur.  DG C-ARM 61-120 MIN, RIGHT FEMUR - 2 VIEW  Comparison: 01/16/2013.  Fluoroscopy time:  1 minute 0 seconds  Findings: Four intraoperative fluoroscopic views demonstrating addition of a right femur intramedullary rod to the preexisting proximal right femur dynamic hip screw.  Proximal and distal interlocking hardware.  The rod traverses the comminuted spiral distal femur fracture.  Improved alignment of fracture fragment. Hardware appears intact.  IMPRESSION: ORIF right femur with no adverse features identified.   Original Report Authenticated By: Erskine Speed, M.D.   Dg Femur Right  01/16/2013   *RADIOLOGY REPORT*  Clinical Data: Fall and suspected right femur fracture.  RIGHT FEMUR - 2 VIEW  Comparison: 04/23/2011 and 01/28/2012  Findings: Dynamic hip screw in the proximal right femur.  There is irregularity of the right femoral neck and right femoral trochanters.  This is probably from the previous injury and appears unchanged from 01/28/2012.  There is a new displaced oblique fracture in the distal right femur.  Fracture appears be mildly comminuted.  Fracture is medially displaced.  The right hip is located.   Fracture is in the region of the distal diaphysis. Probable suprapatellar joint effusion.  IMPRESSION: Displaced comminuted fracture of the distal right femur.   Original Report Authenticated By: Richarda Overlie, M.D.   Ct Knee Right Wo Contrast  01/16/2013   *RADIOLOGY REPORT*  Clinical Data: Status post fall.  Right femur fracture.  CT OF THE RIGHT KNEE WITHOUT CONTRAST  Technique:  Multidetector CT imaging was performed according to the standard protocol. Multiplanar CT image reconstructions were also generated.  Comparison: Plain films earlier this same date.  Findings: The patient has a highly comminuted fracture of the distal diaphysis of the right femur.  The fracture is oblique in orientation originating 12.5 cm superior to the medial femoral condyle and extending in an inferior and oblique orientation through the lateral cortex of the diaphysis of the distal femur 5 cm above the lateral condyle.  There is fragment override of up to 3 cm.  There is one half shaft width medial displacement and fragment override about the 3 cm.  The fracture does not extend to the knee joint. No other fracture  is identified.  There is no knee joint effusion.  The patient has osteoarthritis about the knee. Soft tissue structures demonstrate hematoma about the patient's fracture.  IMPRESSION: Highly comminuted fracture of the distal diaphysis of the right femur as described.  The fracture does not involve the knee joint.   Original Report Authenticated By: Holley Dexter, M.D.   Dg Pelvis Portable  01/18/2013   *RADIOLOGY REPORT*  Clinical Data: Postoperative radiograph; status post right femoral intramedullary rod placement and left hip arthroplasty.  PORTABLE PELVIS  Comparison: Intraoperative radiograph performed earlier today at 05:27 p.m., and right femur radiographs performed 01/16/2013  Findings: There has been interval placement of an intramedullary rod within the right femur, and left hip arthroplasty.  No new  fractures are seen. The prior right hip screw is unchanged in appearance.  Overlying postoperative change and soft tissue air are seen.  The visualized bowel gas pattern is grossly unremarkable.  IMPRESSION: Status post placement of intramedullary rod within the right femur, and left hip arthroplasty.  No new fractures seen at the pelvis.   Original Report Authenticated By: Tonia Ghent, M.D.   Dg Chest Port 1 View  01/21/2013   *RADIOLOGY REPORT*  Clinical Data: Shortness of breath, hypoxia  PORTABLE CHEST - 1 VIEW  Comparison: Portable exam 1008 hours compared to 01/20/2013  Findings: Right jugular central venous catheter tip projects over SVC. Enlargement of cardiac silhouette. Stable mediastinal contours. Prominent hila particularly on the left. Peribronchial thickening with perihilar infiltrates increased since previous exam, extending in the bases, question developing pulmonary edema or infection. Small basilar effusions not excluded. No pneumothorax or acute osseous finding.  IMPRESSION: Increasing perihilar and basilar infiltrates question edema versus infection. Enlargement of cardiac silhouette.   Original Report Authenticated By: Ulyses Southward, M.D.   Dg Chest Port 1 View  01/20/2013   *RADIOLOGY REPORT*  Clinical Data: Progressive hypoxia  PORTABLE CHEST - 1 VIEW  Comparison: Portable exam 0749 hours compared to 01/18/2013  Findings: Rotated to the right. Interval removal of endotracheal and nasogastric tubes. Left jugular line stable, tip projecting over SVC. Mild enlargement of cardiac silhouette. Mediastinal contours and pulmonary vascularity normal. Mild bibasilar atelectasis or infiltrate. Remaining lungs clear. Calcified granuloma left mid lung stable. No definite pleural effusion or pneumothorax.  IMPRESSION: Persistent mild bibasilar atelectasis or infiltrate.   Original Report Authenticated By: Ulyses Southward, M.D.   Dg Chest Port 1 View  01/18/2013   *RADIOLOGY REPORT*  Clinical Data:  Respiratory difficulty  PORTABLE CHEST - 1 VIEW  Comparison: Yesterday  Findings: Endotracheal tube has retracted.  Valve the tip is 2.2 cm from the carina.  Stable left internal jugular central venous catheter.  NG tube placed beyond the gastroesophageal junction. Heart is enlarged.  Vascular congestion without edema has increased.  Left basilar opacity has developed compatible with atelectasis verses airspace disease.  Small left pleural effusion is not excluded.  IMPRESSION: Increasing vascular congestion.  Increasing left lower lobe atelectasis verses airspace disease. Left pleural effusion is not excluded.   Original Report Authenticated By: Jolaine Click, M.D.   Dg Chest Port 1 View  01/18/2013   *RADIOLOGY REPORT*  Clinical Data: Postoperative radiograph; status post central line placement.  PORTABLE CHEST - 1 VIEW  Comparison: Chest radiograph performed 01/16/2013  Findings: There has been placement of a left IJ line, noted ending about the proximal to mid SVC.  The patient's endotracheal tube is seen ending 1 cm above the carina; this should be retracted 1-2 cm.  The lungs are relatively well expanded.  Vascular congestion is again noted.  Left basilar airspace opacity may reflect atelectasis.  Mild interstitial edema cannot be excluded.  A small left pleural effusion may be present.  No pneumothorax is seen.  The cardiomediastinal silhouette is mildly enlarged.  No acute osseous abnormalities are identified.  IMPRESSION:  1.  Left IJ line noted ending about the proximal to mid SVC. 2.  Endotracheal tube seen ending 1 cm above the carina; this should be retracted 1-2 cm. 3.  Left basilar airspace opacity may reflect atelectasis. Vascular congestion and mild cardiomegaly noted, with mildly increased interstitial markings, possibly reflecting mild interstitial edema.  Question of small left pleural effusion.  These results were called by telephone on 01/18/2013 at 02:31 a.m. to Nursing on [id number], who  verbally acknowledged these results.   Original Report Authenticated By: Tonia Ghent, M.D.   Dg Shoulder Right Port  01/17/2013   *RADIOLOGY REPORT*  Clinical Data: Pain after fall  PORTABLE RIGHT SHOULDER - 2+ VIEW  Comparison: None.  Findings: Three views of the right shoulder submitted.  No acute fracture or subluxation.  Mild degenerative changes right AC joint. Diffuse osteopenia.  IMPRESSION: No acute fracture or subluxation.  Mild degenerative changes right AC joint.   Original Report Authenticated By: Natasha Mead, M.D.   Dg Hip Portable 1 View Left  01/18/2013   *RADIOLOGY REPORT*  Clinical Data: Postop.  PORTABLE LEFT HIP - 1 VIEW  Comparison: 01/16/2013 and 01/17/2013  Findings: There is a left hip arthroplasty.  The left hip appears to be located on this cross-table lateral view.  No evidence for a periprosthetic fracture.  IMPRESSION: Left hip replacement without complicating features.   Original Report Authenticated By: Richarda Overlie, M.D.   Dg Femur Right Port  01/18/2013   *RADIOLOGY REPORT*  Clinical Data: Postop for femur fracture.  PORTABLE RIGHT FEMUR - 2 VIEW  Comparison: 01/16/2013  Findings: A retrograde intramedullary nail has been placed.  There are four distal interlocking screws.  There is decreased displacement of the distal femur fracture.  Again noted is the old fixation device extending through the femoral head and neck.  IMPRESSION: Internal fixation of the distal femur fracture with a retrograde nail.   Original Report Authenticated By: Richarda Overlie, M.D.   Dg Knee Left Port  01/17/2013   *RADIOLOGY REPORT*  Clinical Data: Fall.  Pain.  Distal right femur fracture.  Left hip fracture.  PORTABLE LEFT KNEE - 1-2 VIEW  Comparison: None.  Findings: The left knee is located.  Mild degenerative changes are again noted.  A small effusion is evident.  Mild osteopenia is noted.  Superficial soft tissue swelling is noted laterally.  IMPRESSION:  1.  Small joint effusion without to an acute  osseous abnormality. 2.  Mild degenerative changes. 3.  Superficial soft tissue swelling over the lateral aspect of the left knee.   Original Report Authenticated By: Marin Roberts, M.D.   Dg Ankle Right Port  01/17/2013   *RADIOLOGY REPORT*  Clinical Data: Fall.  Right ankle pain.  PORTABLE RIGHT ANKLE - 2 VIEW  Comparison: Right ankle radiographs 07/31/2005.  Findings: Extensive bimalleolar soft tissue swelling is present. Mild osteopenia is noted.  No acute osseous abnormality is present. The ankle is located.  A small bone fragment associated with the medial malleolus is stable.  IMPRESSION:  1.  Diffuse edematous changes about the ankle. 2.  No acute osseous abnormality. 3.  Moderate generalized osteopenia. 4.  Remote  corticated bone fragment associated with the medial malleolus.   Original Report Authenticated By: Marin Roberts, M.D.   Dg C-arm 61-120 Min  01/17/2013   *RADIOLOGY REPORT*  Clinical Data: 79 year old female undergoing ORIF distal right femur.  DG C-ARM 61-120 MIN, RIGHT FEMUR - 2 VIEW  Comparison: 01/16/2013.  Fluoroscopy time:  1 minute 0 seconds  Findings: Four intraoperative fluoroscopic views demonstrating addition of a right femur intramedullary rod to the preexisting proximal right femur dynamic hip screw.  Proximal and distal interlocking hardware.  The rod traverses the comminuted spiral distal femur fracture.  Improved alignment of fracture fragment. Hardware appears intact.  IMPRESSION: ORIF right femur with no adverse features identified.   Original Report Authenticated By: Erskine Speed, M.D.    Scheduled Meds: . antiseptic oral rinse  1 application Mouth Rinse QID  . cefTRIAXone (ROCEPHIN)  IV  1 g Intravenous Q24H  . chlorhexidine  15 mL Mouth/Throat BID  . feeding supplement  237 mL Oral BID BM  . hydrocortisone sod succinate (SOLU-CORTEF) inj  50 mg Intravenous Q8H  . insulin aspart  0-9 Units Subcutaneous TID WC  . levothyroxine  75 mcg Oral QAC  breakfast  . pantoprazole (PROTONIX) IV  40 mg Intravenous Q24H  . vancomycin  1,000 mg Intravenous Q24H   Continuous Infusions: . sodium chloride 20 mL/hr at 01/22/13 0820    Active Problems:   HYPOTHYROIDISM   DEMENTIA   GERD   Cryptogenic cirrhosis   Fall   Femur fracture, right   Fracture of femoral neck, left   Paroxysmal atrial fibrillation   Thrombocytopenia   Diabetes mellitus type 2, controlled   Acute renal failure   Anemia   E. coli and Proteus UTI (urinary tract infection)   Acute respiratory failure   Encephalopathy acute   Severe sepsis with septic shock    Time spent: 40 minutes   [facility]  [facility] Pager [phone]. If 8PM-8AM, please contact night-coverage at [email or web address], password [facility] 01/23/2013, 8:23 AM  LOS: 7 days

## 2013-01-24 LAB — BASIC METABOLIC PANEL
BUN: 37 mg/dL — ABNORMAL HIGH (ref 6–23)
Chloride: 116 mEq/L — ABNORMAL HIGH (ref 96–112)
GFR calc Af Amer: 39 mL/min — ABNORMAL LOW (ref >90)
Potassium: 3.2 mEq/L — ABNORMAL LOW (ref 3.5–5.1)

## 2013-01-24 LAB — CBC
HCT: 26.1 % — ABNORMAL LOW (ref 36.0–46.0)
HCT: 26.8 % — ABNORMAL LOW (ref 36.0–46.0)
Hemoglobin: 8.6 g/dL — ABNORMAL LOW (ref 12.0–15.0)
Hemoglobin: 9 g/dL — ABNORMAL LOW (ref 12.0–15.0)
MCHC: 33 g/dL (ref 30.0–36.0)
RBC: 2.93 MIL/uL — ABNORMAL LOW (ref 3.87–5.11)
RBC: 3.04 MIL/uL — ABNORMAL LOW (ref 3.87–5.11)
WBC: 6.3 10*3/uL (ref 4.0–10.5)
WBC: 8.6 10*3/uL (ref 4.0–10.5)

## 2013-01-24 LAB — GLUCOSE, CAPILLARY
Glucose-Capillary: 193 mg/dL — ABNORMAL HIGH (ref 70–99)
Glucose-Capillary: 94 mg/dL (ref 70–99)

## 2013-01-24 MED ORDER — ALBUTEROL SULFATE (5 MG/ML) 0.5% IN NEBU
2.5000 mg | INHALATION_SOLUTION | RESPIRATORY_TRACT | Status: DC | PRN
  Administered 2013-01-24: 2.5 mg via RESPIRATORY_TRACT
  Filled 2013-01-24: qty 0.5

## 2013-01-24 MED ORDER — FUROSEMIDE 10 MG/ML IJ SOLN: 60.0000 mg | Freq: Once | INTRAMUSCULAR | Status: AC

## 2013-01-24 MED ORDER — PANTOPRAZOLE SODIUM 40 MG PO TBEC
40.0000 mg | DELAYED_RELEASE_TABLET | Freq: Every day | ORAL | Status: DC
  Administered 2013-01-24 – 2013-01-31 (×8): 40 mg via ORAL
  Filled 2013-01-24 (×8): qty 1

## 2013-01-24 MED ORDER — FUROSEMIDE 10 MG/ML IJ SOLN
INTRAMUSCULAR | Status: AC
  Filled 2013-01-24: qty 8

## 2013-01-24 NOTE — Progress Notes (Signed)
[facility] Progress Note     7 Days Post-Op  Subjective   Pt remains in SDU Stable considering numerous medical issues No PT over weekend  Pt states that nurses are rolling her every few hours, did not get out of bed over weekend either    Objective  BP 123/53  Pulse 74  Temp(Src) 98.4 F (36.9 C) (Oral)  Resp 19  Ht 5' (1.524 m)  Wt 78 kg (171 lb 15.3 oz)  BMI 33.58 kg/m2  SpO2 96%  Patient Vitals for the past 24 hrs:  BP Temp Temp src Pulse Resp SpO2  01/24/13 0400 123/53 mmHg 98.4 F (36.9 C) Oral 74 19 96 %  01/24/13 0004 122/52 mmHg 98.1 F (36.7 C) Oral 71 15 95 %  01/23/13 2000 114/44 mmHg 97.9 F (36.6 C) Oral 67 27 96 %  01/23/13 1800 - - - 76 17 98 %  01/23/13 1700 - - - 69 14 98 %  01/23/13 1600 115/54 mmHg 97.8 F (36.6 C) Oral 83 20 97 %  01/23/13 1500 - - - 77 19 96 %  01/23/13 1400 - - - 79 19 96 %  01/23/13 1330 - - - 76 18 97 %  01/23/13 1300 - - - 70 18 97 %  01/23/13 1200 121/54 mmHg 97.8 F (36.6 C) Oral 69 17 98 %  01/23/13 1100 - - - 83 24 96 %  01/23/13 1000 - - - 74 11 98 %  01/23/13 0900 - - - 71 13 98 %    Intake/Output     07/20 0701 - 07/21 0700 07/21 0701 - 07/22 0700   P.O. 1160    I.V. (mL/kg) 440 (5.6)    IV Piggyback 50    Total Intake(mL/kg) 1650 (21.2)    Urine (mL/kg/hr) 2300 (1.2)    Total Output 2300     Net -650            Labs Results for DARLENE, BROZOWSKI (MRN [id number]) as of 01/24/2013 08:10  Ref. Range 01/24/2013 05:00  Sodium Latest Range: 135-145 mEq/L 146 (H)  Potassium Latest Range: 3.5-5.1 mEq/L 3.2 (L)  Chloride Latest Range: 96-112 mEq/L 116 (H)  CO2 Latest Range: 19-32 mEq/L 22  BUN Latest Range: 6-23 mg/dL 37 (H)  Creatinine Latest Range: 0.50-1.10 mg/dL 4.09 (H)  Calcium Latest Range: 8.4-10.5 mg/dL 7.9 (L)  GFR calc non Af Amer Latest Range: >90 mL/min 33 (L)  GFR calc Af Amer Latest Range: >90 mL/min 39 (L)  Glucose Latest Range: 70-99 mg/dL 91    Exam  Gen: awake and  alert, NAD Lungs: clear anterior fields Cardiac: s1 and s2  Abd: + BS, NT Ext:       Bilateral Lower Extremities  All dressings stable  + Swelling bilateral lower extremities  No DCT   Compartments soft and NT  Distal motor and sensory functions intact  Ext warm   + DP pulses   Heels look good, no pressure sores noted, heels floating    Assessment and Plan  7 Days Post-Op 79 y/o female s/p ground level fall  1. Fall 2. R distal femur fx and L femoral neck fx s/p IMN R femur and L hip hemiarthroplasty POD7             Bed to chair transfers                           NWB  R leg                         WBAT L leg for transfers                         L posterior hip precautions             Dressing changes prn  TED hose for swelling    PT- please get pt up to chair today, use lift if necessary   3. UTI             + E. Coli and proteus             quinolone resistant             On rocephin     4. ABL anemia, thrombocytopenia                         Improved h/h             Monitor platelets             No bleeding issues from ortho standpoint at this time  Check CBC this am    5.  Medical issues             Per IM   Pt on vancomycin as well to cover for potential HCAP given acute resp failure  6. DVT/PE prophylaxis             SCD's  Will have TED hose placed as well   7. FEN                          Dysphagia 1 diet   8. Pain                 9. Dispo             will need SNF                            Work with therapies as pt can tolerate  Ortho issues remain stable                Mearl Latin, PA-C [facility] [phone] (P) 01/24/2013 8:11 AM

## 2013-01-24 NOTE — Progress Notes (Addendum)
Nurse attempted to perform passive range of motion with pt lower extremities however pt refused, pt also refused TED hose, SCD's are currently in use will continue to encourage ROM activities.

## 2013-01-24 NOTE — Progress Notes (Signed)
[facility] Progress Note [place] TEAM 1 - Stepdown/ICU TEAM   NIOMIE ENGLERT ION:[id number] DOB: 12/24/1922 DOA: 01/16/2013 PCP: Toma Deiters, MD  Brief narrative: 79 y.o. female with history of cryptogenic cirrhosis, thrombocytopenia, GERD, achalasia, history of aspiration pneumonia in July of 2013 at [facility], dementia, hypothyroidism, hypertension, and depression who presented after falling at the SNF.  Patient provided most of the history at time of admission. She reported that after her lunch at around 1:30 p.m. an aide was helping her out of a chair. She reported that the aides legs and her legs got tangled up and she had a mechanical fall. She immediately had right leg and left hip pain. She was brought to the emergency department and was found to have right distal femur fracture and left femoral subcapital fracture.  After operative repair/ R femoral nailing, L arthroplasty she was taken to 2300 ICU intubated and PCCM assumed care.  She was extubated within 24 hours and watched in the unit an additional 24 hours before transferring out to the SDU.  Assessment/Plan:  Fall with the following injuries:   A) Femur fracture, right   B) Fracture of femoral neck, left -per Ortho - stable   Severe sepsis with septic shock due to E. coli and Proteus UTI  -initially felt to have non infectious pyuria but cx this admit has revealed both E. Coli and proteus  -empiric Levaquin was dc'd 7/15 -E. Coli resistant to fluoroquinolones / Proteus senistive to Rocephin -blood cx's pending -supportive care with hydration -DNR so would not use pressors  -stress dose steroids initiated overnite into 7/17 by PCCM and weaned to off 7/21  Acute renal failure -not improving so cont IVF and follow -baseline crt 1.11 in 2012 but despite hydration SCr and BUN have remained unchanged so suspect higher numbers are new baseline  Anemia / ? Intraoperative blood loss -since was  hypotensive was given 1 unit PRBC's 7/17 -RN was unable to contact guardian to obtain blood consent - pt was symptomatic with recent hypotension so she met requirement to proceed with transfusion due to medical necessity  HYPOTHYROIDISM -cont Synthroid -last documented TSH was 2012 but all OP records not available so since no acute indication to check will defer for now  Cryptogenic cirrhosis / Thrombocytopenia -TB now and baseline <2.0 -Mild elevation AST intially likely low perfusion from [facility] -baseline platelets ~80-90,000 with nadir in the 30's - now back up to 70's -?? Lower platelets due to blood loss vs GN sepsis so will follow trends - may have mild DIC -too lethargic to take PO   Paroxysmal atrial fibrillation -maintaining NSR  Diabetes mellitus type 2, controlled -cont SSI  Acute post op respiratory failure -now with acute hypoxia, wheezing and mild tachypnea in setting of apparent volume overload -IV LASIX X 1 -has been weaned from VM oxygen to [place]. -CXR 7/18 worse so suspected aspiration due to recent AMS - now with wheezing -cont supportive care and current anbxs -DNR so would not pursue intubation   Encephalopathy acute / DEMENTIA  -multifactorial due to severe infection/sepsis and hypotension -Ammonia 48  GERD -PPI but cautiously since on anbx's and incr risk for developing C. diff   DVT prophylaxis: SCDs Code Status: DO NOT RESUSCITATE Family Communication: Patient has a son but a legal guardian manages her clinical decisions: Pt's guardian, Barnie Alderman [phone]) with DSS [employer].  Disposition Plan: Remain in step down  Consultants: Orthopedic PCCM signed off  Procedures: Right intramedullary femoral nailing, removal of hardware on right, left hip arthroplasty  Dr. Carola Frost 01/17/13  Antibiotics: Levaquin 7/14 >>> 7/15 Vancomycin 7/14 >>> 7/15 Rocephin 7/17 >>>  HPI/Subjective: Patient alert and endorsing mild SOB as well as  thirst.  Objective: Blood pressure 121/52, pulse 66, temperature 98.5 F (36.9 C), temperature source Oral, resp. rate 13, height 5' (1.524 m), weight 78 kg (171 lb 15.3 oz), SpO2 99.00%.  Intake/Output Summary (Last 24 hours) at 01/24/13 1103 Last data filed at 01/24/13 0900  Gross per 24 hour  Intake   1320 ml  Output   2300 ml  Net   -980 ml    Exam: General: No acute respiratory distress but still requiring oxygen through a Ventimask Lungs: Coarse to auscultation bilaterally with wheezes, 4 L Cardiovascular: Regular rate and rhythm without murmur gallop or rub normal S1 and S2, no peripheral edema or JVD Abdomen:  nondistended, soft, hypoactive bowel sounds positive, no rebound, no ascites, no appreciable mass Musculoskeletal: No significant cyanosis, clubbing of bilateral lower extremities Neurological: Alert and oriented to name only  Scheduled Meds: Scheduled Meds: . antiseptic oral rinse  15 mL Mouth Rinse BID  . cefTRIAXone (ROCEPHIN)  IV  1 g Intravenous Q24H  . chlorhexidine  15 mL Mouth/Throat BID  . feeding supplement  237 mL Oral BID BM  . hydrocortisone sod succinate (SOLU-CORTEF) inj  25 mg Intravenous Q12H  . insulin aspart  0-9 Units Subcutaneous TID WC  . levothyroxine  75 mcg Oral QAC breakfast  . pantoprazole  40 mg Oral Daily  . vancomycin  1,000 mg Intravenous Q24H   Data Reviewed: Basic Metabolic Panel:  Recent Labs Lab 01/19/13 0700 01/20/13 0335 01/21/13 0550 01/22/13 0400 01/24/13 0500  NA 142 139 141 142 146*  K 4.7 4.4 4.9 4.3 3.2*  CL 117* 115* 114* 117* 116*  CO2 16* 19 15* 16* 22  GLUCOSE 129* 113* 120* 112* 91  BUN 36* 36* 37* 37* 37*  CREATININE 1.58* 1.45* 1.49* 1.34* 1.38*  CALCIUM 8.3* 7.8* 8.1* 8.0* 7.9*   Liver Function Tests:  Recent Labs Lab 01/17/13 2225 01/21/13 0550  AST 36 42*  ALT 18 24  ALKPHOS 119* 101  BILITOT 1.7* 1.5*  PROT 5.0* 5.4*  ALBUMIN 2.4* 2.3*    Recent Labs Lab 01/20/13 1700  AMMONIA 48    CBC:  Recent Labs Lab 01/19/13 0700 01/20/13 0335 01/20/13 2245 01/21/13 0920 01/24/13 0500  WBC 8.0 6.1 6.0 6.0 6.3  HGB 8.3* 7.4* 8.6* 8.7* 8.6*  HCT 25.4* 21.9* 25.0* 25.7* 26.1*  MCV 87.6 86.6 87.1 87.4 89.1  PLT 34* 32* 41* 52* 74*   Cardiac Enzymes:  Recent Labs Lab 01/20/13 0539 01/20/13 1339 01/20/13 2245  TROPONINI <0.30 <0.30 <0.30   CBG:  Recent Labs Lab 01/23/13 0823 01/23/13 1145 01/23/13 1714 01/23/13 2138 01/24/13 0818  GLUCAP 125* 128* 155* 169* 91    Recent Results (from the past 240 hour(s))  SURGICAL PCR SCREEN     Status: None   Collection Time    01/17/13  3:58 AM      Result Value Range Status   MRSA, PCR NEGATIVE  NEGATIVE Final   Staphylococcus aureus NEGATIVE  NEGATIVE Final   Comment:            The Xpert SA Assay (FDA     approved for NASAL specimens     in patients over 40 years of age),     is one component  of     a comprehensive surveillance     program.  Test performance has     been validated by [facility] for patients greater     than or equal to 7 year old.     It is not intended     to diagnose infection nor to     guide or monitor treatment.  URINE CULTURE     Status: None   Collection Time    01/17/13  4:30 AM      Result Value Range Status   Specimen Description URINE, CATHETERIZED   Final   Special Requests Normal   Final   Culture  Setup Time 01/17/2013 05:39   Final   Colony Count >=100,000 COLONIES/ML   Final   Culture     Final   Value: ESCHERICHIA COLI     PROTEUS MIRABILIS   Report Status 01/21/2013 FINAL   Final   Organism ID, Bacteria ESCHERICHIA COLI   Final   Organism ID, Bacteria PROTEUS MIRABILIS   Final  CULTURE, BLOOD (ROUTINE X 2)     Status: None   Collection Time    01/20/13 10:30 AM      Result Value Range Status   Specimen Description BLOOD ARM RIGHT   Final   Special Requests BOTTLES DRAWN AEROBIC ONLY 5.0CC   Final   Culture  Setup Time 01/20/2013 16:42   Final   Culture      Final   Value:        BLOOD CULTURE RECEIVED NO GROWTH TO DATE CULTURE WILL BE HELD FOR 5 DAYS BEFORE ISSUING A FINAL NEGATIVE REPORT   Report Status PENDING   Incomplete  CULTURE, BLOOD (ROUTINE X 2)     Status: None   Collection Time    01/20/13 10:40 AM      Result Value Range Status   Specimen Description BLOOD HAND RIGHT   Final   Special Requests BOTTLES DRAWN AEROBIC ONLY 3.0CC   Final   Culture  Setup Time 01/20/2013 16:42   Final   Culture     Final   Value:        BLOOD CULTURE RECEIVED NO GROWTH TO DATE CULTURE WILL BE HELD FOR 5 DAYS BEFORE ISSUING A FINAL NEGATIVE REPORT   Report Status PENDING   Incomplete     Studies:  Recent x-ray studies have been reviewed in detail by the Attending Physician  Junious Silk, ANP [facility] Office  [phone] Pager [phone]  **If unable to reach the above provider after paging please contact the Flow Manager @ [phone]  On-Call/Text Page:      [email or web address]      password [email or web address]  If 7PM-7AM, please contact night-coverage [email or web address] Password [email or web address] 01/24/2013, 11:03 AM   LOS: 8 days   I have personally examined this patient and reviewed the entire database. I have reviewed the above note, made any necessary editorial changes, and agree with its content.  Lonia Blood, MD [facility]

## 2013-01-25 DIAGNOSIS — R609 Edema, unspecified: Secondary | ICD-10-CM

## 2013-01-25 DIAGNOSIS — E039 Hypothyroidism, unspecified: Secondary | ICD-10-CM

## 2013-01-25 DIAGNOSIS — I4891 Unspecified atrial fibrillation: Secondary | ICD-10-CM

## 2013-01-25 DIAGNOSIS — S72009S Fracture of unspecified part of neck of unspecified femur, sequela: Secondary | ICD-10-CM

## 2013-01-25 LAB — BASIC METABOLIC PANEL
Calcium: 7.4 mg/dL — ABNORMAL LOW (ref 8.4–10.5)
Creatinine, Ser: 1.35 mg/dL — ABNORMAL HIGH (ref 0.50–1.10)
GFR calc non Af Amer: 34 mL/min — ABNORMAL LOW (ref >90)
Sodium: 144 mEq/L (ref 135–145)

## 2013-01-25 LAB — GLUCOSE, CAPILLARY
Glucose-Capillary: 98 mg/dL (ref 70–99)
Glucose-Capillary: 118 mg/dL — ABNORMAL HIGH (ref 70–99)
Glucose-Capillary: 164 mg/dL — ABNORMAL HIGH (ref 70–99)

## 2013-01-25 LAB — CBC
MCH: 29.4 pg (ref 26.0–34.0)
Platelets: 68 10*3/uL — ABNORMAL LOW (ref 150–400)
RBC: 2.89 MIL/uL — ABNORMAL LOW (ref 3.87–5.11)
RDW: 19.3 % — ABNORMAL HIGH (ref 11.5–15.5)
WBC: 6.3 10*3/uL (ref 4.0–10.5)

## 2013-01-25 MED ORDER — FUROSEMIDE 10 MG/ML IJ SOLN
INTRAMUSCULAR | Status: AC
  Administered 2013-01-25: 10 mg
  Filled 2013-01-25: qty 4

## 2013-01-25 MED ORDER — FUROSEMIDE 10 MG/ML IJ SOLN
INTRAMUSCULAR | Status: AC
  Administered 2013-01-25: 10 mg
  Filled 2013-01-25: qty 8

## 2013-01-25 MED ORDER — FUROSEMIDE 10 MG/ML IJ SOLN
60.0000 mg | Freq: Two times a day (BID) | INTRAMUSCULAR | Status: AC
  Administered 2013-01-25 – 2013-01-26 (×4): 60 mg via INTRAVENOUS
  Filled 2013-01-25 (×3): qty 6

## 2013-01-25 NOTE — Progress Notes (Signed)
ANTIBIOTIC CONSULT NOTE - FOLLOW UP  Pharmacy Consult for vancomycin Indication: HCAP  Allergies  Allergen Reactions  . Aspirin     hives  . Codeine     REACTION: UNKNOWN REACTION  . Morphine And Related Other (See Comments)    "completely knocks her out"  . Penicillins   . Sulfonamide Derivatives     REACTION: UNKNOWN REACTION  . Zolpidem Tartrate     REACTION: UNKNOWN REACTION    Patient Measurements: Height: 5' (152.4 cm) Weight: 171 lb 15.3 oz (78 kg) IBW/kg (Calculated) : 45.5  Vital Signs: Temp: 97.9 F (36.6 C) (07/22 1607) Temp src: Oral (07/22 1607) BP: 108/28 mmHg (07/22 1607) Pulse Rate: 76 (07/22 1607) Intake/Output from previous day: 07/21 0701 - 07/22 0700 In: 1480 [I.V.:480] Out: 2300 [Urine:2300] Intake/Output from this shift: Total I/O In: 1470 [P.O.:590; I.V.:180; IV Piggyback:700] Out: 2000 [Urine:2000]  Labs:  Recent Labs  01/24/13 0500 01/24/13 1015 01/25/13 0455  WBC 6.3 8.6 6.3  HGB 8.6* 9.0* 8.5*  PLT 74* 115* 68*  CREATININE 1.38*  --  1.35*   Estimated Creatinine Clearance: 27.1 ml/min (by C-G formula based on Cr of 1.35).  Recent Labs  01/25/13 1450  VANCOTROUGH 20.0       Assessment: Vancomycin trough = 20 mcg/ml on vancomycin 1 g IV q24h in this 79 yo female for treatment of HCAP.  Trough is therapeutic at steady state.   Goal of Therapy:  Vancomycin trough level 15-20 mcg/ml  Plan:  Continue Vancomycin 1 g IV q24h -Will follow renal function, cultures and vancomycin levels as needed  Noah Delaine, RPh Clinical Pharmacist Pager: [phone]  01/25/2013 4:40 PM

## 2013-01-25 NOTE — Progress Notes (Signed)
Speech Language Pathology Dysphagia Treatment Patient Details Name: [name] MRN: [id number] DOB: Dec 17, 1922 Today's Date: 01/25/2013 Time: 1152-1201 SLP Time Calculation (min): 9 min  Assessment / Plan / Recommendation Clinical Impression  Ongoing f/u for dysphagia: pt tolerating thin liquids today with no s/s of aspiration, but belching frequently (hx of primary esophageal dysphagia/achalasia.)  Lungs: Coarse to auscultation bilaterally with wheezes.  Has transitioned from ventimask to [place]; saturating at 94%.  Recommend continued cautious PO consumption - aspiration risk due primarily to potential backflow from esophagus.  Follow [facility] as [facility] at [facility].     Diet Recommendation  Continue with Current Diet: Dysphagia 1 (puree);Thin liquid    SLP Plan Continue with current plan of care   Pertinent Vitals/Pain 8 in back/repositioned   Swallowing Goals  SLP Swallowing Goals Patient will utilize recommended strategies during swallow to increase swallowing safety with: Maximum assistance Swallow Study Goal #2 - Progress: Progressing toward goal  General Temperature Spikes Noted: No Respiratory Status: Supplemental O2 delivered via (comment) (nasal cannula 4 liters) Behavior/Cognition: Alert;Cooperative Oral Cavity - Dentition: Missing dentition Patient Positioning: Upright in bed  Oral Cavity - Oral Hygiene     Dysphagia Treatment Treatment focused on: Skilled observation of diet tolerance Treatment Methods/Modalities: Skilled observation Patient observed directly with PO's: Yes Type of PO's observed: Thin liquids Feeding: Able to feed self Liquids provided via: Straw Pharyngeal Phase Signs & Symptoms: Multiple swallows Type of cueing: Verbal Amount of cueing: Moderate assist today.   [name] L. [name], [place] CCC/SLP Pager [phone]     [name] [name] 01/25/2013, 12:02 PM

## 2013-01-25 NOTE — Progress Notes (Signed)
[facility] Progress Note [place] TEAM 1 - Stepdown/ICU TEAM   [name] [name]:[id number] DOB: 04-02-1923 DOA: 01/16/2013 PCP: Toma Deiters, MD  Brief narrative: 79 y.o. female with history of cryptogenic cirrhosis, thrombocytopenia, GERD, achalasia, history of aspiration pneumonia in July of 2013 at [facility], dementia, hypothyroidism, hypertension, and depression who presented after falling at the SNF.  Patient provided most of the history at time of admission. She reported that after her lunch at around 1:30 p.m. an aide was helping her out of a chair. She reported that the aides legs and her legs got tangled up and she had a mechanical fall. She immediately had right leg and left hip pain. She was brought to the emergency department and was found to have right distal femur fracture and left femoral subcapital fracture.  After operative repair/ R femoral nailing, L arthroplasty she was taken to 2300 ICU intubated and PCCM assumed care.  She was extubated within 24 hours and watched in the unit an additional 24 hours before transferring out to the SDU.  Assessment/Plan:  Acute post op respiratory failure -now with acute hypoxia, wheezing and mild tachypnea in setting of apparent volume overload -IV Lasix x 1 7/21 but still appears overloaded so will give q 12 hrs for 4 doses -has been weaned from VM oxygen to [place]. -CXR 7/18 worse so suspected aspiration due to recent AMS - now with wheezing -cont supportive care and current anbxs -DNR so would not pursue intubation   Fall with the following injuries:   A) Femur fracture, right   B) Fracture of femoral neck, left -per Ortho - stable   Severe LUE edema -on same side as CL so suspect acute DVT -check venous duplex -insert PIV and remove CL -? Heparin vs Lovenox if DVT found- not great candidate for long-term anticoagulation  Severe sepsis with septic shock due to E. coli and Proteus UTI  -initially felt to  have non infectious pyuria but cx this admit has revealed both E. Coli and proteus  -empiric Levaquin was dc'd 7/15 -E. Coli/Proteous resistant to fluoroquinolones / sensitive to Rocephin -blood cx's NGTD -DNR so would not use pressors  -stress dose steroids initiated 7/17 by PCCM and weaned off 7/21  Acute renal failure -baseline crt 1.11 in 2012 but despite hydration SCr and BUN have remained unchanged so suspect higher numbers are new baseline  Anemia / ? Intraoperative blood loss -since was hypotensive was given 1 unit PRBC's 7/17 -RN was unable to contact guardian to obtain blood consent - pt was symptomatic with recent hypotension so she met requirement to proceed with transfusion due to medical necessity  HYPOTHYROIDISM -cont Synthroid -last documented TSH was 2012 but all OP records not available so since no acute indication to check will defer for now  Cryptogenic cirrhosis / Thrombocytopenia -TB now and baseline <2.0 -Mild elevation AST intially likely low perfusion from [facility] -baseline platelets ~80-90,000 with nadir in the 30's - now back up to 70's -?? Lower platelets due to blood loss vs GN sepsis so will follow trends - may have mild DIC -too lethargic to take PO   Paroxysmal atrial fibrillation -maintaining NSR  Diabetes mellitus type 2, controlled -cont SSI  Encephalopathy acute / DEMENTIA  -multifactorial due to severe infection/sepsis and hypotension -Ammonia this admit was 48  GERD -PPI but cautiously since on anbx's and incr risk for developing C. diff   DVT prophylaxis: SCDs Code Status: DO NOT RESUSCITATE Family Communication: Patient  has a son but a legal guardian manages her clinical decisions: Pt's guardian, Barnie Alderman [phone]) with DSS [facility].  Disposition Plan: Remain in step down  Consultants: Orthopedic PCCM signed off  Procedures: Right intramedullary femoral nailing, removal of hardware on right, left hip  arthroplasty  Dr. Carola Frost 01/17/13  Antibiotics: Levaquin 7/14 >>> 7/15 Vancomycin 7/14 >>> 7/15 Rocephin 7/17 >>>  HPI/Subjective: Patient alert and endorsing constant left chest pressure/reproducible with palpation  Objective: Blood pressure 121/54, pulse 73, temperature 97.8 F (36.6 C), temperature source Oral, resp. rate 22, height 5' (1.524 m), weight 78 kg (171 lb 15.3 oz), SpO2 97.00%.  Intake/Output Summary (Last 24 hours) at 01/25/13 1012 Last data filed at 01/25/13 0900  Gross per 24 hour  Intake   1460 ml  Output   2300 ml  Net   -840 ml    Exam: General: No acute respiratory distress but still requiring oxygen through a Ventimask Lungs: Coarse to auscultation bilaterally with wheezes, 4 L Cardiovascular: Regular rate and rhythm without murmur gallop or rub normal S1 and S2, 1-2+ LE peripheral edema without JVD; has significant LUE edema extending from hand to shoulder on same side of CL Abdomen:  nondistended, soft, hypoactive bowel sounds positive, no rebound, no ascites, no appreciable mass Musculoskeletal: No significant cyanosis, clubbing of bilateral lower extremities-tender left anterior chest wall Neurological: Alert and oriented to name only  Scheduled Meds: Scheduled Meds: . antiseptic oral rinse  15 mL Mouth Rinse BID  . cefTRIAXone (ROCEPHIN)  IV  1 g Intravenous Q24H  . chlorhexidine  15 mL Mouth/Throat BID  . feeding supplement  237 mL Oral BID BM  . furosemide  60 mg Intravenous Q12H  . insulin aspart  0-9 Units Subcutaneous TID WC  . levothyroxine  75 mcg Oral QAC breakfast  . pantoprazole  40 mg Oral Daily  . vancomycin  1,000 mg Intravenous Q24H   Data Reviewed: Basic Metabolic Panel:  Recent Labs Lab 01/20/13 0335 01/21/13 0550 01/22/13 0400 01/24/13 0500 01/25/13 0455  NA 139 141 142 146* 144  K 4.4 4.9 4.3 3.2* 3.1*  CL 115* 114* 117* 116* 113*  CO2 19 15* 16* 22 25  GLUCOSE 113* 120* 112* 91 98  BUN 36* 37* 37* 37* 29*   CREATININE 1.45* 1.49* 1.34* 1.38* 1.35*  CALCIUM 7.8* 8.1* 8.0* 7.9* 7.4*   Liver Function Tests:  Recent Labs Lab 01/21/13 0550  AST 42*  ALT 24  ALKPHOS 101  BILITOT 1.5*  PROT 5.4*  ALBUMIN 2.3*    Recent Labs Lab 01/20/13 1700  AMMONIA 48   CBC:  Recent Labs Lab 01/20/13 2245 01/21/13 0920 01/24/13 0500 01/24/13 1015 01/25/13 0455  WBC 6.0 6.0 6.3 8.6 6.3  HGB 8.6* 8.7* 8.6* 9.0* 8.5*  HCT 25.0* 25.7* 26.1* 26.8* 25.8*  MCV 87.1 87.4 89.1 88.2 89.3  PLT 41* 52* 74* 115* 68*   Cardiac Enzymes:  Recent Labs Lab 01/20/13 0539 01/20/13 1339 01/20/13 2245  TROPONINI <0.30 <0.30 <0.30   CBG:  Recent Labs Lab 01/24/13 0818 01/24/13 1244 01/24/13 1631 01/24/13 2040 01/25/13 0804  GLUCAP 91 193* 147* 94 98    Recent Results (from the past 240 hour(s))  SURGICAL PCR SCREEN     Status: None   Collection Time    01/17/13  3:58 AM      Result Value Range Status   MRSA, PCR NEGATIVE  NEGATIVE Final   Staphylococcus aureus NEGATIVE  NEGATIVE Final  Comment:            The Xpert SA Assay (FDA     approved for NASAL specimens     in patients over 50 years of age),     is one component of     a comprehensive surveillance     program.  Test performance has     been validated by [employer] for patients greater     than or equal to 65 year old.     It is not intended     to diagnose infection nor to     guide or monitor treatment.  URINE CULTURE     Status: None   Collection Time    01/17/13  4:30 AM      Result Value Range Status   Specimen Description URINE, CATHETERIZED   Final   Special Requests Normal   Final   Culture  Setup Time 01/17/2013 05:39   Final   Colony Count >=100,000 COLONIES/ML   Final   Culture     Final   Value: ESCHERICHIA COLI     PROTEUS MIRABILIS   Report Status 01/21/2013 FINAL   Final   Organism ID, Bacteria ESCHERICHIA COLI   Final   Organism ID, Bacteria PROTEUS MIRABILIS   Final  CULTURE, BLOOD (ROUTINE X  2)     Status: None   Collection Time    01/20/13 10:30 AM      Result Value Range Status   Specimen Description BLOOD ARM RIGHT   Final   Special Requests BOTTLES DRAWN AEROBIC ONLY 5.0CC   Final   Culture  Setup Time 01/20/2013 16:42   Final   Culture     Final   Value:        BLOOD CULTURE RECEIVED NO GROWTH TO DATE CULTURE WILL BE HELD FOR 5 DAYS BEFORE ISSUING A FINAL NEGATIVE REPORT   Report Status PENDING   Incomplete  CULTURE, BLOOD (ROUTINE X 2)     Status: None   Collection Time    01/20/13 10:40 AM      Result Value Range Status   Specimen Description BLOOD HAND RIGHT   Final   Special Requests BOTTLES DRAWN AEROBIC ONLY 3.0CC   Final   Culture  Setup Time 01/20/2013 16:42   Final   Culture     Final   Value:        BLOOD CULTURE RECEIVED NO GROWTH TO DATE CULTURE WILL BE HELD FOR 5 DAYS BEFORE ISSUING A FINAL NEGATIVE REPORT   Report Status PENDING   Incomplete     Studies:  Recent x-ray studies have been reviewed in detail by the Attending Physician  Junious Silk, ANP [facility] Office  [phone] Pager [phone]  **If unable to reach the above provider after paging please contact the Flow Manager @ [phone]  On-Call/Text Page:      [email or web address]      password [email or web address]  If 7PM-7AM, please contact night-coverage [email or web address] Password [email or web address] 01/25/2013, 10:12 AM   LOS: 9 days   I have examined the patient, reviewed the chart and modified the above note which I agree with.   Jayden Rudge,MD [phone] 01/25/2013, 2:03 PM

## 2013-01-25 NOTE — Progress Notes (Addendum)
Physical Therapy Treatment Patient Details Name: KELLSIE GRINDLE MRN: [id number] DOB: Dec 23, 1922 Today's Date: 01/25/2013 Time: 4098-1191 PT Time Calculation (min): 17 min  PT Assessment / Plan / Recommendation  History of Present Illness R distal femur fx and L femoral neck fx s/p IMN R femur and L hip hemiarthroplasty    Clinical Impression Pt with increased EOB sitting tolerance this date however remains to have limited ability to assist with all mobility. Pt remains inappropriate for sit to stand due to inability to assist and WBing restrictions. Per MD, a lift would be okay to use for safe OOB transfers to chair. Pt was primarily w/c bound PTA. Pt reports only able to walk short distances but stayed in w/c all day. Pt remains appropriate to return to SNF to achieve PLOF.   PT Comments     Follow Up Recommendations  SNF     Does the patient have the potential to tolerate intense rehabilitation     Barriers to Discharge        Equipment Recommendations  None recommended by PT    Recommendations for Other Services    Frequency Min 2X/week   Progress towards PT Goals Progress towards PT goals: Progressing toward goals  Plan Current plan remains appropriate    Precautions / Restrictions Precautions Precautions: Posterior Hip;Fall Restrictions Weight Bearing Restrictions: Yes RLE Weight Bearing: Non weight bearing LLE Weight Bearing: Weight bearing as tolerated   Pertinent Vitals/Pain Pt reports back pain but did not rate. Pt with L UE edema. RN notified. L UE elevated upon PT leaving    Mobility  Bed Mobility Bed Mobility: Supine to Sit;Sit to Supine Supine to Sit: 1: +2 Total assist Supine to Sit: Patient Percentage: 0% Sitting - Scoot to Edge of Bed: 1: +2 Total assist Sitting - Scoot to Edge of Bed: Patient Percentage: 0% Details for Bed Mobility Assistance: assist for LEs and trunk elevation. pt unable to initiate LE mvmt or use UEs to assist Transfers Transfers:  Not assessed Ambulation/Gait Ambulation/Gait Assistance: Not tested (comment)    Exercises Total Joint Exercises Ankle Circles/Pumps: AROM;Both;10 reps;Seated Long Arc Quad: AROM;Both;10 reps;Seated   PT Diagnosis:    PT Problem List:   PT Treatment Interventions:     PT Goals (current goals can now be found in the care plan section)    Visit Information  Last PT Received On: 01/25/13 Assistance Needed: +2 History of Present Illness: R distal femur fx and L femoral neck fx s/p IMN R femur and L hip hemiarthroplasty     Subjective Data  Subjective: Pt received supine in bed.   Cognition  Cognition Arousal/Alertness: Awake/alert Behavior During Therapy: WFL for tasks assessed/performed Overall Cognitive Status: History of cognitive impairments - at baseline Memory: Decreased recall of precautions    Balance  Static Sitting Balance Static Sitting - Balance Support: Bilateral upper extremity supported;Feet supported Static Sitting - Level of Assistance: 4: Min assist Static Sitting - Comment/# of Minutes: pt sat EOB x 10 min, completed LE and UE ther ex  End of Session PT - End of Session Activity Tolerance: Patient limited by fatigue;Patient limited by pain Patient left: in bed;with call bell/phone within reach Nurse Communication: Mobility status (left up in chair position)   GP     Marcene Brawn 01/25/2013, 11:28 AM   Lewis Shock, PT, DPT Pager #: [phone] Office #: [phone]

## 2013-01-25 NOTE — Progress Notes (Signed)
Pain tolerable. Pt participates with turn and position. Encouraging diet variety as to date pt refuses all food other than orange sherbert. Left arm elevated through shift. Pt responding to lasix. Noted decrease in oozing at wound sites.

## 2013-01-26 LAB — CULTURE, BLOOD (ROUTINE X 2): Culture: NO GROWTH

## 2013-01-26 LAB — GLUCOSE, CAPILLARY
Glucose-Capillary: 162 mg/dL — ABNORMAL HIGH (ref 70–99)
Glucose-Capillary: 117 mg/dL — ABNORMAL HIGH (ref 70–99)

## 2013-01-26 LAB — BASIC METABOLIC PANEL
CO2: 28 mEq/L (ref 19–32)
Calcium: 7.5 mg/dL — ABNORMAL LOW (ref 8.4–10.5)
GFR calc Af Amer: 36 mL/min — ABNORMAL LOW (ref >90)
Sodium: 143 mEq/L (ref 135–145)

## 2013-01-26 MED ORDER — POTASSIUM CHLORIDE 20 MEQ/15ML (10%) PO LIQD
40.0000 meq | Freq: Two times a day (BID) | ORAL | Status: AC
  Administered 2013-01-26 – 2013-01-27 (×3): 40 meq via ORAL
  Filled 2013-01-26 (×4): qty 30

## 2013-01-26 MED ORDER — OXYCODONE HCL 5 MG/5ML PO SOLN
5.0000 mg | ORAL | Status: DC | PRN
  Administered 2013-01-26 – 2013-01-27 (×2): 5 mg via ORAL
  Administered 2013-01-27 (×2): 10 mg via ORAL
  Administered 2013-01-27 – 2013-01-28 (×2): 5 mg via ORAL
  Administered 2013-01-28: 10 mg via ORAL
  Administered 2013-01-28 (×3): 5 mg via ORAL
  Administered 2013-01-29: 10 mg via ORAL
  Administered 2013-01-29: 5 mg via ORAL
  Administered 2013-01-29 (×2): 10 mg via ORAL
  Filled 2013-01-26: qty 5
  Filled 2013-01-26: qty 10
  Filled 2013-01-26 (×3): qty 5
  Filled 2013-01-26 (×3): qty 10
  Filled 2013-01-26: qty 5
  Filled 2013-01-26: qty 10
  Filled 2013-01-26: qty 5
  Filled 2013-01-26: qty 10
  Filled 2013-01-26 (×2): qty 5
  Filled 2013-01-26: qty 10

## 2013-01-26 NOTE — Progress Notes (Signed)
[facility] Progress Note [place] TEAM 1 - Stepdown/ICU TEAM   [name] ZOX:[id number] DOB: 1922-08-02 DOA: 01/16/2013 PCP: Toma Deiters, MD  Brief narrative: 79 y.o. female with history of cryptogenic cirrhosis, thrombocytopenia, GERD, achalasia, history of aspiration pneumonia in July of 2013 at [facility], dementia, hypothyroidism, hypertension, and depression who presented after falling at the SNF.  Patient provided most of the history at time of admission. She reported that after her lunch at around 1:30 p.m. an aide was helping her out of a chair. She reported that the aides legs and her legs got tangled up and she had a mechanical fall. She immediately had right leg and left hip pain. She was brought to the emergency department and was found to have right distal femur fracture and left femoral subcapital fracture.  After operative repair/ R femoral nailing, L arthroplasty she was taken to 2300 ICU intubated and PCCM assumed care.  She was extubated within 24 hours and watched in the unit an additional 24 hours before transferring out to the SDU.  Assessment/Plan:  Acute post op respiratory failure developed hypoxia, wheezing and mild tachypnea in setting of apparent volume overload -IV Lasix x 1 7/21 but still remaned overloaded so Lasix q 12 hrs for 4 doses given  -has been weaned from VM oxygen to  -CXR 7/18 worse so suspected aspiration due to recent AMS  -cont supportive care and current anbxs -DNR so would not pursue intubation   Fall with the following injuries:   A) Femur fracture, right   B) Fracture of femoral neck, left -per Ortho - stable   Hypokalemia -oral replete and follow BMET  Severe LUE edema -on same side as CL - improving after CL removed -Venous duplex w/o DVT  Severe sepsis with septic shock due to E. coli and Proteus UTI  -initially felt to have non infectious pyuria but cx this admit has revealed both E. Coli and  proteus  -empiric Levaquin was dc'd 7/15 -E. Coli/Proteous resistant to fluoroquinolones / sensitive to Rocephin -blood cx's NGTD -DNR so would not use pressors  -stress dose steroids initiated 7/17 by PCCM and weaned off 7/21 -D/C rocephin after 10 days of tx  Acute renal failure -baseline crt 1.11 in 2012 but despite hydration SCr and BUN have remained unchanged so suspect higher numbers are new baseline  Anemia / ? Intraoperative blood loss -since was hypotensive was given 1 unit PRBC's 7/17 -RN was unable to contact guardian to obtain blood consent - pt was symptomatic with recent hypotension so she met requirement to proceed with transfusion due to medical necessity  HYPOTHYROIDISM -cont Synthroid  Cryptogenic cirrhosis / Thrombocytopenia -TB <2.0 -Mild elevation AST intially likely low perfusion from [facility] -baseline platelets ~80-90,000 with nadir in the 30's - now back up to ~70 -?? Lower platelets due to blood loss vs GN sepsis so will follow trends - may have mild DIC  Paroxysmal atrial fibrillation -maintaining NSR  Diabetes mellitus type 2, controlled -cont SSI  Encephalopathy acute / DEMENTIA  -multifactorial due to severe infection/sepsis and hypotension -Ammonia this admit was 48 -signif improved today   GERD -PPI but cautiously since on anbx's and incr risk for developing C. diff   DVT prophylaxis: SCDs Code Status: DO NOT RESUSCITATE Family Communication: Patient has a son but a legal guardian manages her clinical decisions: Pt's guardian, Barnie Alderman ([phone]) with DSS [employer].  Disposition Plan: Remain in step down  Consultants: Orthopedic PCCM  signed off  Procedures: Right intramedullary femoral nailing, removal of hardware on right, left hip arthroplasty  Dr. Carola Frost 01/17/13  Antibiotics: Levaquin 7/14 >>> 7/15 Vancomycin 7/14 >>> 7/15 Rocephin 7/17 >>>  HPI/Subjective: Patient alert. Bath in process by [employer] fund[employer]. No complaints.   More alert and interactive today.  Objective: Blood pressure 92/27, pulse 66, temperature 98.3 F (36.8 C), temperature source Oral, resp. rate 13, height 5' (1.524 m), weight 78 kg (171 lb 15.3 oz), SpO2 96.00%.  Intake/Output Summary (Last 24 hours) at 01/26/13 1402 Last data filed at 01/26/13 1300  Gross per 24 hour  Intake   2140 ml  Output   6250 ml  Net  -4110 ml    Exam: General: No acute respiratory distress  Lungs: Coarse to auscultation bilaterally without wheezes, 4 L Cardiovascular: Regular rate and rhythm without murmur gallop or rub normal S1 and S2, <1+ LE peripheral edema without JVD; significant LUE edema extending from hand to shoulder on same side of previous CL resolving Abdomen:  nondistended, soft, hypoactive bowel sounds positive, no rebound, no ascites, no appreciable mass Musculoskeletal: No significant cyanosis, clubbing of bilateral lower extremities-tender left anterior chest wall Neurological: Alert and oriented to name only  Scheduled Meds: Scheduled Meds: . antiseptic oral rinse  15 mL Mouth Rinse BID  . cefTRIAXone (ROCEPHIN)  IV  1 g Intravenous Q24H  . chlorhexidine  15 mL Mouth/Throat BID  . feeding supplement  237 mL Oral BID BM  . furosemide  60 mg Intravenous Q12H  . insulin aspart  0-9 Units Subcutaneous TID WC  . levothyroxine  75 mcg Oral QAC breakfast  . pantoprazole  40 mg Oral Daily  . potassium chloride  40 mEq Oral BID  . vancomycin  1,000 mg Intravenous Q24H   Data Reviewed: Basic Metabolic Panel:  Recent Labs Lab 01/21/13 0550 01/22/13 0400 01/24/13 0500 01/25/13 0455 01/26/13 0445  NA 141 142 146* 144 143  K 4.9 4.3 3.2* 3.1* 2.6*  CL 114* 117* 116* 113* 104  CO2 15* 16* 22 25 28   GLUCOSE 120* 112* 91 98 120*  BUN 37* 37* 37* 29* 25*  CREATININE 1.49* 1.34* 1.38* 1.35* 1.45*  CALCIUM 8.1* 8.0* 7.9* 7.4* 7.5*   Liver Function Tests:  Recent Labs Lab 01/21/13 0550  AST 42*  ALT 24  ALKPHOS 101  BILITOT 1.5*   PROT 5.4*  ALBUMIN 2.3*    Recent Labs Lab 01/20/13 1700  AMMONIA 48   CBC:  Recent Labs Lab 01/20/13 2245 01/21/13 0920 01/24/13 0500 01/24/13 1015 01/25/13 0455  WBC 6.0 6.0 6.3 8.6 6.3  HGB 8.6* 8.7* 8.6* 9.0* 8.5*  HCT 25.0* 25.7* 26.1* 26.8* 25.8*  MCV 87.1 87.4 89.1 88.2 89.3  PLT 41* 52* 74* 115* 68*   Cardiac Enzymes:  Recent Labs Lab 01/20/13 0539 01/20/13 1339 01/20/13 2245  TROPONINI <0.30 <0.30 <0.30   CBG:  Recent Labs Lab 01/25/13 1246 01/25/13 1737 01/25/13 2200 01/26/13 0740 01/26/13 1200  GLUCAP 118* 159* 164* 111* 162*    Recent Results (from the past 240 hour(s))  SURGICAL PCR SCREEN     Status: None   Collection Time    01/17/13  3:58 AM      Result Value Range Status   MRSA, PCR NEGATIVE  NEGATIVE Final   Staphylococcus aureus NEGATIVE  NEGATIVE Final   Comment:            The Xpert SA Assay (FDA     approved for NASAL specimens  in patients over 65 years of age),     is one component of     a comprehensive surveillance     program.  Test performance has     been validated by [employer] for patients greater     than or equal to 6 year old.     It is not intended     to diagnose infection nor to     guide or monitor treatment.  URINE CULTURE     Status: None   Collection Time    01/17/13  4:30 AM      Result Value Range Status   Specimen Description URINE, CATHETERIZED   Final   Special Requests Normal   Final   Culture  Setup Time 01/17/2013 05:39   Final   Colony Count >=100,000 COLONIES/ML   Final   Culture     Final   Value: ESCHERICHIA COLI     PROTEUS MIRABILIS   Report Status 01/21/2013 FINAL   Final   Organism ID, Bacteria ESCHERICHIA COLI   Final   Organism ID, Bacteria PROTEUS MIRABILIS   Final  CULTURE, BLOOD (ROUTINE X 2)     Status: None   Collection Time    01/20/13 10:30 AM      Result Value Range Status   Specimen Description BLOOD ARM RIGHT   Final   Special Requests BOTTLES DRAWN  AEROBIC ONLY 5.0CC   Final   Culture  Setup Time 01/20/2013 16:42   Final   Culture NO GROWTH 5 DAYS   Final   Report Status 01/26/2013 FINAL   Final  CULTURE, BLOOD (ROUTINE X 2)     Status: None   Collection Time    01/20/13 10:40 AM      Result Value Range Status   Specimen Description BLOOD HAND RIGHT   Final   Special Requests BOTTLES DRAWN AEROBIC ONLY 3.0CC   Final   Culture  Setup Time 01/20/2013 16:42   Final   Culture NO GROWTH 5 DAYS   Final   Report Status 01/26/2013 FINAL   Final     Studies:  Recent x-ray studies have been reviewed in detail by the Attending Physician  Junious Silk, ANP [facility] Office  [phone] Pager [phone]  **If unable to reach the above provider after paging please contact the Flow Manager @ [phone]  On-Call/Text Page:      [email or web address]      password [email or web address]  If 7PM-7AM, please contact night-coverage [email or web address] Password [email or web address] 01/26/2013, 2:02 PM   LOS: 10 days   I have personally examined this patient and reviewed the entire database. I have reviewed the above note, made any necessary editorial changes, and agree with its content.  Lonia Blood, MD [facility]

## 2013-01-26 NOTE — Progress Notes (Signed)
Patient reported to this CSW by [facility] on 2600 today- patient admitted from [facility]- noted plans for return at d/c. FL2 updated and spoke with SNF rep to update also. Will follow along for d/c back to SNF once stable.  Reece Levy, MSW [phone]

## 2013-01-27 LAB — GLUCOSE, CAPILLARY
Glucose-Capillary: 106 mg/dL — ABNORMAL HIGH (ref 70–99)
Glucose-Capillary: 191 mg/dL — ABNORMAL HIGH (ref 70–99)
Glucose-Capillary: 97 mg/dL (ref 70–99)

## 2013-01-27 LAB — CBC
HCT: 30.3 % — ABNORMAL LOW (ref 36.0–46.0)
MCH: 29.6 pg (ref 26.0–34.0)
MCHC: 32.3 g/dL (ref 30.0–36.0)
MCV: 91.5 fL (ref 78.0–100.0)
Platelets: 56 10*3/uL — ABNORMAL LOW (ref 150–400)
RDW: 19.6 % — ABNORMAL HIGH (ref 11.5–15.5)

## 2013-01-27 LAB — COMPREHENSIVE METABOLIC PANEL
ALT: 25 U/L (ref 0–35)
AST: 36 U/L (ref 0–37)
Albumin: 2.2 g/dL — ABNORMAL LOW (ref 3.5–5.2)
Alkaline Phosphatase: 123 U/L — ABNORMAL HIGH (ref 39–117)
BUN: 22 mg/dL (ref 6–23)
Chloride: 100 mEq/L (ref 96–112)
Potassium: 3.8 mEq/L (ref 3.5–5.1)
Sodium: 139 mEq/L (ref 135–145)
Total Bilirubin: 1.6 mg/dL — ABNORMAL HIGH (ref 0.3–1.2)
Total Protein: 5.2 g/dL — ABNORMAL LOW (ref 6.0–8.3)

## 2013-01-27 NOTE — Progress Notes (Signed)
[facility] Progress Note  TEAM 1 - Stepdown/ICU TEAM   [name]:[id number] DOB: [birth year] DOA: 01/16/2013 PCP: Toma Deiters, MD  Brief narrative: 79 y.o. female with history of cryptogenic cirrhosis, thrombocytopenia, GERD, achalasia, history of aspiration pneumonia in July of 2013 at [facility], dementia, hypothyroidism, hypertension, and depression who presented after falling at the SNF.  Patient provided most of the history at time of admission. She reported that after her lunch at around 1:30 p.m. an aide was helping her out of a chair. She reported that the aides legs and her legs got tangled up and she had a mechanical fall. She immediately had right leg and left hip pain. She was brought to the emergency department and was found to have right distal femur fracture and left femoral subcapital fracture.  After operative repair/ R femoral nailing, L arthroplasty she was taken to 2300 ICU intubated and PCCM assumed care.  She was extubated within 24 hours and watched in the unit an additional 24 hours before transferring out to the SDU.  Assessment/Plan:  Acute post op respiratory failure developed hypoxia, wheezing and mild tachypnea in setting of apparent volume overload -IV Lasix x 1 7/21 but still remaned overloaded so Lasix q 12 hrs for 4 doses given with significant improvement in symptoms  -has been weaned from VM oxygen to [place] -CXR 7/18 had worsened and suspected aspiration due to recent AMS -swallow eval revealed dysphagia/SLP following -cont supportive care  -DNR so would not pursue intubation   Fall with the following injuries:   A) Femur fracture, right   B) Fracture of femoral neck, left -per Ortho - stable   Hypokalemia -oral replete prn and follow BMET  Severe LUE edema -on same side as CL - improving after CL removed -Venous duplex w/o DVT  Severe sepsis with septic shock due to E. coli and Proteus UTI  -initially felt  to have non infectious pyuria but cx this admit has revealed both E. Coli and proteus  -empiric Levaquin was dc'd 7/15 -E. Coli/Proteous resistant to fluoroquinolones / sensitive to Rocephin -blood cx's NGTD -DNR so would not use pressors  -stress dose steroids initiated 7/17 by PCCM and weaned off 7/21 -D/C rocephin after 10 days of tx  Acute renal failure -baseline crt 1.11 in 2012 but despite hydration SCr and BUN have remained unchanged so suspect higher numbers are new baseline -current renal function stable  Anemia / ? Intraoperative blood loss -since was hypotensive was given 1 unit PRBC's 7/17 -RN was unable to contact guardian to obtain blood consent - pt was symptomatic with recent hypotension so she met requirement to proceed with transfusion due to medical necessity  HYPOTHYROIDISM -cont Synthroid  Cryptogenic cirrhosis / Thrombocytopenia -TB <2.0 -Mild elevation AST intially likely low perfusion from [facility] -baseline platelets ~80-90,000 with nadir in the 30's -variable trend with most recent 56,000 -?? Lower platelets due to blood loss vs GN sepsis   Paroxysmal atrial fibrillation -maintaining NSR  Diabetes mellitus type 2, controlled -cont SSI  Encephalopathy acute / DEMENTIA  -multifactorial due to severe infection/sepsis and hypotension -Ammonia this admit was 48 -signif improved today   GERD -PPI but cautiously since on anbx's and incr risk for developing C. diff   DVT prophylaxis: SCDs Code Status: DO NOT RESUSCITATE Family Communication: Patient has a son but a legal guardian manages her clinical decisions: Pt's guardian, Barnie Alderman ([phone]) with DSS [employer]. Have NOT spoken with any family  or a guardian since admit and have NOT received any calls requesting information or updates Disposition Plan: Transfer to Floor  Consultants: Orthopedic PCCM signed off  Procedures: Right intramedullary femoral nailing, removal of hardware on  right, left hip arthroplasty  Dr. Carola Frost 01/17/13  Antibiotics: Levaquin 7/14 >>> 7/15 Vancomycin 7/14 >>> 7/15 Rocephin 7/17 >>>  HPI/Subjective: Patient alert. No distress. Still with chest wall pain beneath left breast.  Objective: Blood pressure 101/31, pulse 61, temperature 98.7 F (37.1 C), temperature source Oral, resp. rate 13, height 5' (1.524 m), weight 78 kg (171 lb 15.3 oz), SpO2 98.00%.  Intake/Output Summary (Last 24 hours) at 01/27/13 1222 Last data filed at 01/27/13 0650  Gross per 24 hour  Intake 784.67 ml  Output   4650 ml  Net -3865.33 ml    Exam: General: No acute respiratory distress  Lungs: Coarse to auscultation bilaterally without wheezes, 4 L Cardiovascular: Regular rate and rhythm without murmur gallop or rub normal S1 and S2, <1+ LE peripheral edema without JVD; significant LUE edema extending from hand to shoulder on same side of previous CL resolving Abdomen:  nondistended, soft, hypoactive bowel sounds positive, no rebound, no ascites, no appreciable mass Musculoskeletal: No significant cyanosis, clubbing of bilateral lower extremities-tender left anterior chest wall Neurological: Alert and oriented to name only  Scheduled Meds: Scheduled Meds: . antiseptic oral rinse  15 mL Mouth Rinse BID  . cefTRIAXone (ROCEPHIN)  IV  1 g Intravenous Q24H  . chlorhexidine  15 mL Mouth/Throat BID  . feeding supplement  237 mL Oral BID BM  . insulin aspart  0-9 Units Subcutaneous TID WC  . levothyroxine  75 mcg Oral QAC breakfast  . pantoprazole  40 mg Oral Daily   Data Reviewed: Basic Metabolic Panel:  Recent Labs Lab 01/22/13 0400 01/24/13 0500 01/25/13 0455 01/26/13 0445 01/27/13 0436  NA 142 146* 144 143 139  K 4.3 3.2* 3.1* 2.6* 3.8  CL 117* 116* 113* 104 100  CO2 16* 22 25 28 31   GLUCOSE 112* 91 98 120* 119*  BUN 37* 37* 29* 25* 22  CREATININE 1.34* 1.38* 1.35* 1.45* 1.35*  CALCIUM 8.0* 7.9* 7.4* 7.5* 7.9*   Liver Function Tests:  Recent  Labs Lab 01/21/13 0550 01/27/13 0436  AST 42* 36  ALT 24 25  ALKPHOS 101 123*  BILITOT 1.5* 1.6*  PROT 5.4* 5.2*  ALBUMIN 2.3* 2.2*    Recent Labs Lab 01/20/13 1700  AMMONIA 48   CBC:  Recent Labs Lab 01/21/13 0920 01/24/13 0500 01/24/13 1015 01/25/13 0455 01/27/13 0436  WBC 6.0 6.3 8.6 6.3 7.9  HGB 8.7* 8.6* 9.0* 8.5* 9.8*  HCT 25.7* 26.1* 26.8* 25.8* 30.3*  MCV 87.4 89.1 88.2 89.3 91.5  PLT 52* 74* 115* 68* 56*   Cardiac Enzymes:  Recent Labs Lab 01/20/13 1339 01/20/13 2245  TROPONINI <0.30 <0.30   CBG:  Recent Labs Lab 01/26/13 0740 01/26/13 1200 01/26/13 1650 01/26/13 2103 01/27/13 0826  GLUCAP 111* 162* 117* 133* 106*    Recent Results (from the past 240 hour(s))  CULTURE, BLOOD (ROUTINE X 2)     Status: None   Collection Time    01/20/13 10:30 AM      Result Value Range Status   Specimen Description BLOOD ARM RIGHT   Final   Special Requests BOTTLES DRAWN AEROBIC ONLY 5.0CC   Final   Culture  Setup Time 01/20/2013 16:42   Final   Culture NO GROWTH 5 DAYS   Final  Report Status 01/26/2013 FINAL   Final  CULTURE, BLOOD (ROUTINE X 2)     Status: None   Collection Time    01/20/13 10:40 AM      Result Value Range Status   Specimen Description BLOOD HAND RIGHT   Final   Special Requests BOTTLES DRAWN AEROBIC ONLY 3.0CC   Final   Culture  Setup Time 01/20/2013 16:42   Final   Culture NO GROWTH 5 DAYS   Final   Report Status 01/26/2013 FINAL   Final     Studies:  Recent x-ray studies have been reviewed in detail by the Attending Physician  Junious Silk, ANP [facility] Office  [phone] Pager [phone]  **If unable to reach the above provider after paging please contact the Flow Manager @ [phone]  On-Call/Text Page:      [email or web address]      password [email or web address]  If 7PM-7AM, please contact night-coverage [email or web address] Password [facility] 01/27/2013, 12:22 PM   LOS: 11 days   I have examined the patient, reviewed the chart and  modified the above note which I agree with.   Allora Bains,MD [phone] 01/27/2013, 5:48 PM

## 2013-01-27 NOTE — Progress Notes (Signed)
NUTRITION FOLLOW UP  Intervention:    Continue Ensure Complete twice daily (350 kcals, 13 gm protein per 8 fl oz bottle) RD to follow for nutrition care plan  Nutrition Dx:   Increased nutrient needs related to fracture/surgery as evidenced by estimated needs, ongoing  Goal:   Oral intake with meals & supplements to meet >/= 90% of estimated nutrition needs, progressing  Monitor:   PO & supplemental intake, weight, labs, I/O's  Assessment:   Patient admitted s/p fall. Sustained a displaced comminuted fracture of the distal right femur and displaced left femoral subcapital fracture.   Patient s/p procedures 7/14:  INTRAMEDULLARY (IM) RETROGRADE FEMORAL NAILING  HARDWARE REMOVAL OF RIGHT DHS  ARTHROPLASTY BIPOLAR HIP   Patient reports she's eating "a little".  PO intake variable at 20-40% per flowsheet records.  She likes to drink Ensure supplements when they are cold.  Disposition: back to SNF when medically stable.  Height: Ht Readings from Last 1 Encounters:  01/16/13 5' (1.524 m)    Weight Status:   Wt Readings from Last 1 Encounters:  01/20/13 171 lb 15.3 oz (78 kg)    Re-estimated needs:  Kcal: 1500-1700 Protein: 80-90 gm Fluid: 1.5-1.7 L  Skin: leg, hip, elbow abrasion   Diet Order: Dysphagia 1, thin liquids   Intake/Output Summary (Last 24 hours) at 01/27/13 1506 Last data filed at 01/27/13 0650  Gross per 24 hour  Intake 484.67 ml  Output   3450 ml  Net -2965.33 ml    Labs:   Recent Labs Lab 01/25/13 0455 01/26/13 0445 01/27/13 0436  NA 144 143 139  K 3.1* 2.6* 3.8  CL 113* 104 100  CO2 25 28 31   BUN 29* 25* 22  CREATININE 1.35* 1.45* 1.35*  CALCIUM 7.4* 7.5* 7.9*  GLUCOSE 98 120* 119*    CBG (last 3)   Recent Labs  01/26/13 2103 01/27/13 0826 01/27/13 1136  GLUCAP 133* 106* 191*    Scheduled Meds: . antiseptic oral rinse  15 mL Mouth Rinse BID  . cefTRIAXone (ROCEPHIN)  IV  1 g Intravenous Q24H  . chlorhexidine  15 mL  Mouth/Throat BID  . feeding supplement  237 mL Oral BID BM  . insulin aspart  0-9 Units Subcutaneous TID WC  . levothyroxine  75 mcg Oral QAC breakfast  . pantoprazole  40 mg Oral Daily    Continuous Infusions: . sodium chloride 20 mL/hr at 01/25/13 1610    Maureen Chatters, RD, LDN Pager #: [phone] After-Hours Pager #: [phone]

## 2013-01-27 NOTE — Progress Notes (Signed)
Physical Therapy Treatment Patient Details Name: [name] MRN: [id number] DOB: Sep 10, 1922 Today's Date: 01/27/2013 Time: 8295-6213 PT Time Calculation (min): 24 min  PT Assessment / Plan / Recommendation  History of Present Illness R distal femur fx and L femoral neck fx s/p IMN R femur and L hip hemiarthroplasty    Clinical Impression Pt tolerated sitting EOB for 10 minutes while performing therapeutic exercises.  Pt able to assist with bed mobility using UEs.  Pt did need max motivation to get sit EOB.    Follow Up Recommendations  SNF     Equipment Recommendations  None recommended by PT    Frequency Min 2X/week   Progress towards PT Goals Progress towards PT goals: Progressing toward goals (slowly)  Plan Current plan remains appropriate    Precautions / Restrictions Precautions Precautions: Posterior Hip;Fall Precaution Booklet Issued: No Restrictions Weight Bearing Restrictions: Yes RLE Weight Bearing: Non weight bearing LLE Weight Bearing: Weight bearing as tolerated   Pertinent Vitals/Pain C/o bilateral hip pain but does not rate    Mobility  Bed Mobility Bed Mobility: Supine to Sit;Sit to Supine Supine to Sit: 1: +2 Total assist Supine to Sit: Patient Percentage: 10% Sitting - Scoot to Edge of Bed: 1: +2 Total assist Sitting - Scoot to Edge of Bed: Patient Percentage: 20% Sit to Supine: 1: +2 Total assist;With rail Sit to Supine: Patient Percentage: 20% Details for Bed Mobility Assistance: (A) with LE in/out of bed with cues for technique.  Pt able to initiate UE movement to assist Transfers Transfers: Not assessed Ambulation/Gait Ambulation/Gait Assistance: Not tested (comment)    Exercises Total Joint Exercises Ankle Circles/Pumps: AROM;Both;10 reps;Seated Long Arc Quad: AROM;Both;Seated;20 reps General Exercises - Upper Extremity Shoulder Flexion: Strengthening;Both;10 reps;Seated Shoulder ABduction: Strengthening;Both;10 reps;Seated   PT  Diagnosis:    PT Problem List:   PT Treatment Interventions:     PT Goals (current goals can now be found in the care plan section) Acute Rehab PT Goals Patient Stated Goal: Pain relief PT Goal Formulation: With patient Time For Goal Achievement: 02/02/13 Potential to Achieve Goals: Fair  Visit Information  Last PT Received On: 01/27/13 Assistance Needed: +2 History of Present Illness: R distal femur fx and L femoral neck fx s/p IMN R femur and L hip hemiarthroplasty     Subjective Data  Subjective: Pt received supine in bed. Patient Stated Goal: Pain relief   Cognition  Cognition Arousal/Alertness: Awake/alert Behavior During Therapy: WFL for tasks assessed/performed Overall Cognitive Status: History of cognitive impairments - at baseline Memory: Decreased recall of precautions    Balance  Balance Balance Assessed: Yes Static Sitting Balance Static Sitting - Balance Support: Bilateral upper extremity supported;Feet supported Static Sitting - Level of Assistance: 4: Min assist Static Sitting - Comment/# of Minutes: Pt sat EOB x 10 minutes while completing LE and UE ther ex.  Pt needed intermittent min (A) to maintain upright posture during LE ther ex  End of Session PT - End of Session Equipment Utilized During Treatment: Oxygen Activity Tolerance: Patient limited by fatigue;Patient limited by pain Patient left: in bed;with call bell/phone within reach (placed in chair position) Nurse Communication: Mobility status;Need for lift equipment   GP     Jozlin Bently 01/27/2013, 8:53 AM  Jake Shark, PT DPT [phone]

## 2013-01-27 NOTE — Consult Note (Signed)
I have seen and examined the patient. I agree with the findings above.  It was discussed with the patient's guardian the risks and benefits of surgery, including the possibility of infection, nerve injury, vessel injury, wound breakdown, arthritis, symptomatic hardware, DVT/ PE, loss of motion, and need for further surgery among others.  The guardian understood these risks and wished to proceed.   Budd Palmer, MD

## 2013-01-27 NOTE — Progress Notes (Signed)
Speech Language Pathology Dysphagia Treatment Patient Details Name: [name] MRN: [id number] DOB: 10/18/1922 Today's Date: 01/27/2013 Time: 4098-1191 SLP Time Calculation (min): 11 min  Assessment / Plan / Recommendation Clinical Impression  Continued f/u for dysphagia: pt tolerating dysphagia 1, thin liquids with no s/s of aspiration, but persisting belching due to primary esophageal dysphagia/achalasia. Saturating at 99% on nasal cannula during PO consumption.  PO intake has been poor.  Recommend continued conservative diet with eosphageal precautions in place - aspiration risk due primarily to potential backflow from esophagus.  REC D/C to SNF on current diet with SNF SLP f/u for diet upgrade.  SLP to d/c from our services at this time.    Diet Recommendation  Continue with Current Diet: Dysphagia 1 (puree);Thin liquid    SLP Plan All goals met       Swallowing Goals  SLP Swallowing Goals Patient will utilize recommended strategies during swallow to increase swallowing safety with: Maximum assistance met  General Temperature Spikes Noted: No Respiratory Status: Other (comment) (Coarse to auscultation bilaterally without wheezes, 4 L) Behavior/Cognition: Alert;Cooperative Oral Cavity - Dentition: Missing dentition Patient Positioning: Upright in bed  Oral Cavity - Oral Hygiene Does patient have any of the following "at risk" factors?: Oxygen therapy - cannula, mask, simple oxygen devices Patient is AT RISK - Oral Care Protocol followed (see row info): Yes   Dysphagia Treatment Treatment focused on: Skilled observation of diet tolerance Treatment Methods/Modalities: Skilled observation Patient observed directly with PO's: Yes Type of PO's observed: Thin liquids;Dysphagia 1 (puree) Feeding: Needs assist Liquids provided via: Straw Pharyngeal Phase Signs & Symptoms: Multiple swallows Type of cueing: Verbal Amount of cueing: Minimal   GO     Carolan Shiver 01/27/2013, 11:54 AM

## 2013-01-27 NOTE — Progress Notes (Signed)
[facility] Progress Note     10 Days Post-Op  Subjective   No new ortho issues C/o pain B LEx Reviewed PT not from this am, some gains made   Objective  BP 101/31  Pulse 61  Temp(Src) 98.7 F (37.1 C) (Oral)  Resp 13  Ht 5' (1.524 m)  Wt 78 kg (171 lb 15.3 oz)  BMI 33.58 kg/m2  SpO2 98%  Patient Vitals for the past 24 hrs:  BP Temp Temp src Pulse Resp SpO2  01/27/13 0848 101/31 mmHg 98.7 F (37.1 C) - 61 13 98 %  01/27/13 0408 - 97.9 F (36.6 C) Oral - - -  01/27/13 0400 104/42 mmHg - - 68 12 97 %  01/27/13 0003 - 98.9 F (37.2 C) Oral - - -  01/27/13 0000 101/32 mmHg - - 68 10 97 %  01/26/13 2104 109/48 mmHg - - 71 14 98 %  01/26/13 2002 - 98 F (36.7 C) Oral - - -  01/26/13 2000 96/25 mmHg - - 66 16 99 %  01/26/13 1552 96/24 mmHg 98.3 F (36.8 C) Oral 66 10 98 %  01/26/13 1201 92/27 mmHg 98.3 F (36.8 C) Oral 66 13 96 %    Intake/Output     07/23 0701 - 07/24 0700 07/24 0701 - 07/25 0700   P.O. 720    I.V. (mL/kg) 204.7 (2.6)    IV Piggyback 250    Total Intake(mL/kg) 1174.7 (15.1)    Urine (mL/kg/hr) 4650 (2.5)    Total Output 4650     Net -3475.3          Stool Occurrence 2 x      Labs Results for [name], [name] (MRN [id number]) as of 01/27/2013 10:48  Ref. Range 01/27/2013 04:36  Sodium Latest Range: 135-145 mEq/L 139  Potassium Latest Range: 3.5-5.1 mEq/L 3.8  Chloride Latest Range: 96-112 mEq/L 100  CO2 Latest Range: 19-32 mEq/L 31  BUN Latest Range: 6-23 mg/dL 22  Creatinine Latest Range: 0.50-1.10 mg/dL 4.09 (H)  Calcium Latest Range: 8.4-10.5 mg/dL 7.9 (L)  GFR calc non Af Amer Latest Range: >90 mL/min 34 (L)  GFR calc Af Amer Latest Range: >90 mL/min 40 (L)  Glucose Latest Range: 70-99 mg/dL 811 (H)  Alkaline Phosphatase Latest Range: 39-117 U/L 123 (H)  Albumin Latest Range: 3.5-5.2 g/dL 2.2 (L)  AST Latest Range: 0-37 U/L 36  ALT Latest Range: 0-35 U/L 25  Total Protein Latest Range: 6.0-8.3 g/dL 5.2 (L)  Total  Bilirubin Latest Range: 0.3-1.2 mg/dL 1.6 (H)  WBC Latest Range: 4.0-10.5 K/uL 7.9  RBC Latest Range: 3.87-5.11 MIL/uL 3.31 (L)  Hemoglobin Latest Range: 12.0-15.0 g/dL 9.8 (L)  HCT Latest Range: 36.0-46.0 % 30.3 (L)  MCV Latest Range: 78.0-100.0 fL 91.5  MCH Latest Range: 26.0-34.0 pg 29.6  MCHC Latest Range: 30.0-36.0 g/dL 91.4  RDW Latest Range: 11.5-15.5 % 19.6 (H)  Platelets Latest Range: 150-400 K/uL 56 (L)    Exam  Gen: Awake, appears comfortable  Ext:       Bilateral Lower Extremities             All dressings stable             + Swelling bilateral lower extremities             No DCT               Compartments soft and NT  Distal motor and sensory functions intact             Ext warm               + DP pulses               Heels look good, no pressure sores noted, heels floating    Assessment and Plan  10 Days Post-Op  79 y/o female s/p ground level fall  1. Fall 2. R distal femur fx and L femoral neck fx s/p IMN R femur and L hip hemiarthroplasty POD 10             Bed to chair transfers                           NWB R leg                         WBAT L leg for transfers                         L posterior hip precautions             Dressing changes prn             TED hose for swelling if pt can tolerate getting on   Will d/c staples next week if still here   3. UTI             + E. Coli and proteus             quinolone resistant             On rocephin     4. ABL anemia, thrombocytopenia                         Improved h/h             Monitor platelets             No bleeding issues from ortho standpoint at this time             Check CBC this am    5.  Medical issues             Per IM   6. DVT/PE prophylaxis             SCD's               7. FEN                          Dysphagia 1 diet   8. Pain             continue with current regimen      9. Dispo             will need SNF                            Work with  therapies as pt can tolerate             Ortho issues remain stable   Will see next week if still here  Follow up with ortho in 7-10 days  Call with questions [phone], pager listed below  Put in relevant d/c orders for pt     Mearl Latin,  PA-C [facility] [phone] (P) 01/27/2013 10:46 AM

## 2013-01-28 DIAGNOSIS — N390 Urinary tract infection, site not specified: Secondary | ICD-10-CM

## 2013-01-28 DIAGNOSIS — A498 Other bacterial infections of unspecified site: Secondary | ICD-10-CM

## 2013-01-28 LAB — BASIC METABOLIC PANEL
Calcium: 8 mg/dL — ABNORMAL LOW (ref 8.4–10.5)
Creatinine, Ser: 1.36 mg/dL — ABNORMAL HIGH (ref 0.50–1.10)
GFR calc Af Amer: 39 mL/min — ABNORMAL LOW (ref >90)
GFR calc non Af Amer: 34 mL/min — ABNORMAL LOW (ref >90)
Sodium: 136 mEq/L (ref 135–145)

## 2013-01-28 LAB — GLUCOSE, CAPILLARY
Glucose-Capillary: 108 mg/dL — ABNORMAL HIGH (ref 70–99)
Glucose-Capillary: 94 mg/dL (ref 70–99)

## 2013-01-28 NOTE — Progress Notes (Signed)
[facility] Progress Note [name]   [name] XBM:[id number] DOB: 12-11-1922 DOA: 01/16/2013 PCP: Toma Deiters, MD  Brief narrative: 79 y.o. female with history of cryptogenic cirrhosis, thrombocytopenia, GERD, achalasia, history of aspiration pneumonia in July of 2013 at [facility], dementia, hypothyroidism, hypertension, and depression who presented after falling at the SNF.  Patient provided most of the history at time of admission. She reported that after her lunch at around 1:30 p.m. an aide was helping her out of a chair. She reported that the aides legs and her legs got tangled up and she had a mechanical fall. She immediately had right leg and left hip pain. She was brought to the emergency department and was found to have right distal femur fracture and left femoral subcapital fracture.  After operative repair/ R femoral nailing, L arthroplasty she was taken to 2300 ICU intubated and PCCM assumed care.  She was extubated within 24 hours and watched in the unit an additional 24 hours before transferring out to the SDU.  Assessment/Plan:  Acute post op respiratory failure developed hypoxia, wheezing and mild tachypnea in setting of apparent volume overload -IV Lasix x 1 7/21 but still remaned overloaded so Lasix q 12 hrs for 4 doses given with significant improvement in symptoms  -has been weaned from VM oxygen to [place]. Weaned further. -CXR 7/18 had worsened and suspected aspiration due to recent AMS -swallow eval revealed dysphagia/SLP following -DNR so would not pursue intubation   Fall with the following injuries:   A) Femur fracture, right   B) Fracture of femoral neck, left -per Ortho - stable   Hypokalemia Resolved  Severe LUE edema Resolved. DVT ruled out  Severe sepsis with septic shock due to E. coli and Proteus UTI  -initially felt to have non infectious pyuria but cx this admit has revealed both E. Coli and  proteus  -empiric Levaquin was dc'd 7/15 -E. Coli/Proteous resistant to fluoroquinolones / sensitive to Rocephin -blood cx's NGTD -DNR so would not use pressors  -stress dose steroids initiated 7/17 by PCCM and weaned off 7/21 -D/C rocephin after 10 days of tx  Acute renal failure Improved.  Anemia / ? Intraoperative blood loss -since was hypotensive was given 1 unit PRBC's 7/17 -RN was unable to contact guardian to obtain blood consent - pt was symptomatic with recent hypotension so she met requirement to proceed with transfusion due to medical necessity  HYPOTHYROIDISM -cont Synthroid  Cryptogenic cirrhosis / Thrombocytopenia -TB <2.0 -Mild elevation AST intially likely low perfusion from [facility] -baseline platelets ~80-90,000 with nadir in the 30's -variable trend with most recent 56,000 -?? Lower platelets due to blood loss vs GN sepsis   Paroxysmal atrial fibrillation -maintaining NSR  Diabetes mellitus type 2, controlled -cont SSI  Encephalopathy acute / DEMENTIA  -multifactorial due to severe infection/sepsis and hypotension -Ammonia this admit was 48 -signif improved today   GERD -PPI but cautiously since on anbx's and incr risk for developing C. diff   DVT prophylaxis: SCDs Code Status: DO NOT RESUSCITATE Family Communication: Patient has a son but a legal guardian manages her clinical decisions: Pt's guardian, Barnie Alderman ([phone]) with DSS [employer]. Have NOT spoken with any family or a guardian since admit and have NOT received any calls requesting information or updates Disposition Plan: Stable. Will arrange transfer to nursing home.  Consultants: Orthopedic PCCM signed off  Procedures: Right intramedullary femoral nailing, removal of hardware on right,  left hip arthroplasty  Dr. Carola Frost 01/17/13  Antibiotics: Levaquin 7/14 >>> 7/15 Vancomycin 7/14 >>> 7/15 Rocephin 7/17 >>>  HPI/Subjective: Patient alert. No distress. Still with chest  wall pain beneath left breast.  Objective: Blood pressure 112/34, pulse 68, temperature 98.2 F (36.8 C), temperature source Oral, resp. rate 18, height 5' (1.524 m), weight 78 kg (171 lb 15.3 oz), SpO2 94.00%.  Intake/Output Summary (Last 24 hours) at 01/28/13 1721 Last data filed at 01/28/13 1500  Gross per 24 hour  Intake    220 ml  Output    800 ml  Net   -580 ml    Exam: General: Alert. Asking for pain medication. Lungs: Clear to auscultation bilaterally without wheeze rhonchi or rales Cardiovascular: Regular rate and rhythm without murmur gallop or rub  Abdomen:  nondistended, soft, hypoactive bowel sounds positive, no rebound, no ascites, no appreciable mass Musculoskeletal: No significant cyanosis, clubbing of bilateral lower extremities. Left arm not particularly edematous today.  Scheduled Meds: Scheduled Meds: . antiseptic oral rinse  15 mL Mouth Rinse BID  . cefTRIAXone (ROCEPHIN)  IV  1 g Intravenous Q24H  . chlorhexidine  15 mL Mouth/Throat BID  . feeding supplement  237 mL Oral BID BM  . insulin aspart  0-9 Units Subcutaneous TID WC  . levothyroxine  75 mcg Oral QAC breakfast  . pantoprazole  40 mg Oral Daily   Data Reviewed: Basic Metabolic Panel:  Recent Labs Lab 01/24/13 0500 01/25/13 0455 01/26/13 0445 01/27/13 0436 01/28/13 0520  NA 146* 144 143 139 136  K 3.2* 3.1* 2.6* 3.8 4.1  CL 116* 113* 104 100 98  CO2 22 25 28 31  34*  GLUCOSE 91 98 120* 119* 105*  BUN 37* 29* 25* 22 18  CREATININE 1.38* 1.35* 1.45* 1.35* 1.36*  CALCIUM 7.9* 7.4* 7.5* 7.9* 8.0*   Liver Function Tests:  Recent Labs Lab 01/27/13 0436  AST 36  ALT 25  ALKPHOS 123*  BILITOT 1.6*  PROT 5.2*  ALBUMIN 2.2*   No results found for this basename: AMMONIA,  in the last 168 hours CBC:  Recent Labs Lab 01/24/13 0500 01/24/13 1015 01/25/13 0455 01/27/13 0436  WBC 6.3 8.6 6.3 7.9  HGB 8.6* 9.0* 8.5* 9.8*  HCT 26.1* 26.8* 25.8* 30.3*  MCV 89.1 88.2 89.3 91.5  PLT  74* 115* 68* 56*   Cardiac Enzymes: No results found for this basename: CKTOTAL, CKMB, CKMBINDEX, TROPONINI,  in the last 168 hours CBG:  Recent Labs Lab 01/27/13 1136 01/27/13 2234 01/28/13 0635 01/28/13 1217 01/28/13 1709  GLUCAP 191* 97 108* 133* 94    Recent Results (from the past 240 hour(s))  CULTURE, BLOOD (ROUTINE X 2)     Status: None   Collection Time    01/20/13 10:30 AM      Result Value Range Status   Specimen Description BLOOD ARM RIGHT   Final   Special Requests BOTTLES DRAWN AEROBIC ONLY 5.0CC   Final   Culture  Setup Time 01/20/2013 16:42   Final   Culture NO GROWTH 5 DAYS   Final   Report Status 01/26/2013 FINAL   Final  CULTURE, BLOOD (ROUTINE X 2)     Status: None   Collection Time    01/20/13 10:40 AM      Result Value Range Status   Specimen Description BLOOD HAND RIGHT   Final   Special Requests BOTTLES DRAWN AEROBIC ONLY 3.0CC   Final   Culture  Setup Time 01/20/2013 16:42  Final   Culture NO GROWTH 5 DAYS   Final   Report Status 01/26/2013 FINAL   Final    01/28/2013, 5:21 PM   LOS: 12 days    Missie Gehrig L,MD [phone]  01/28/2013, 5:21 PM

## 2013-01-28 NOTE — Progress Notes (Signed)
Patient transferred to unit 5N. I have reported to Sabino Niemann, CSW covering who will proceed with d/c plans for d/c back to [facility]. Reece Levy, MSW [phone]

## 2013-01-29 ENCOUNTER — Inpatient Hospital Stay (HOSPITAL_COMMUNITY): Payer: PRIVATE HEALTH INSURANCE

## 2013-01-29 DIAGNOSIS — G609 Hereditary and idiopathic neuropathy, unspecified: Secondary | ICD-10-CM

## 2013-01-29 LAB — GLUCOSE, CAPILLARY
Glucose-Capillary: 95 mg/dL (ref 70–99)
Glucose-Capillary: 93 mg/dL (ref 70–99)
Glucose-Capillary: 133 mg/dL — ABNORMAL HIGH (ref 70–99)

## 2013-01-29 MED ORDER — MEGESTROL ACETATE 400 MG/10ML PO SUSP: 400.0000 mg | Freq: Every day | ORAL | Status: DC

## 2013-01-29 MED ORDER — DOCUSATE SODIUM 100 MG PO CAPS: 100.0000 mg | ORAL_CAPSULE | Freq: Two times a day (BID) | ORAL | Status: DC

## 2013-01-29 MED ORDER — RIFAXIMIN 550 MG PO TABS
550.0000 mg | ORAL_TABLET | Freq: Every day | ORAL | Status: DC
  Administered 2013-01-29 – 2013-01-31 (×3): 550 mg via ORAL
  Filled 2013-01-29 (×4): qty 1

## 2013-01-29 MED ORDER — DRONABINOL 2.5 MG PO CAPS
2.5000 mg | ORAL_CAPSULE | Freq: Two times a day (BID) | ORAL | Status: DC
  Administered 2013-01-29 (×2): 2.5 mg via ORAL
  Filled 2013-01-29 (×2): qty 1

## 2013-01-29 MED ORDER — NYSTATIN 100000 UNIT/GM EX POWD
Freq: Two times a day (BID) | CUTANEOUS | Status: DC
  Administered 2013-01-29 – 2013-01-31 (×5): via TOPICAL
  Filled 2013-01-29: qty 15

## 2013-01-29 MED ORDER — POLYETHYLENE GLYCOL 3350 17 G PO PACK
17.0000 g | PACK | Freq: Every day | ORAL | Status: DC
  Administered 2013-01-29 – 2013-01-30 (×2): 17 g via ORAL
  Filled 2013-01-29 (×4): qty 1

## 2013-01-29 MED ORDER — PREGABALIN 50 MG PO CAPS
50.0000 mg | ORAL_CAPSULE | Freq: Three times a day (TID) | ORAL | Status: DC
  Administered 2013-01-29 (×2): 50 mg via ORAL
  Filled 2013-01-29 (×4): qty 1

## 2013-01-29 MED ORDER — ENSURE COMPLETE PO LIQD
237.0000 mL | Freq: Three times a day (TID) | ORAL | Status: DC
  Administered 2013-01-29 – 2013-01-31 (×6): 237 mL via ORAL

## 2013-01-29 MED ORDER — DONEPEZIL HCL 10 MG PO TABS
10.0000 mg | ORAL_TABLET | Freq: Every day | ORAL | Status: DC
  Administered 2013-01-30: 10 mg via ORAL
  Filled 2013-01-29 (×4): qty 1

## 2013-01-29 NOTE — Progress Notes (Signed)
[facility]  [name] AVW:[id number] DOB: 1922-12-16 DOA: 01/16/2013 PCP: Toma Deiters, MD  Complete chart review  Brief narrative: 79 y.o. female with history of cryptogenic cirrhosis, thrombocytopenia, GERD, achalasia, history of aspiration pneumonia in July of 2013 at [facility], dementia, hypothyroidism, hypertension, and depression who presented after falling at the SNF.  Patient provided most of the history at time of admission. She reported that after her lunch at around 1:30 p.m. an aide was helping her out of a chair. She reported that the aides legs and her legs got tangled up and she had a mechanical fall. She immediately had right leg and left hip pain. She was brought to the emergency department and was found to have right distal femur fracture and left femoral subcapital fracture.  After operative repair/ R femoral nailing, L arthroplasty she was taken to 2300 ICU intubated and PCCM assumed care.  She was extubated within 24 hours and watched in the unit an additional 24 hours before transferring out to the SDU.  Assessment/Plan:  Acute post op respiratory failure developed hypoxia, wheezing and mild tachypnea in setting of apparent volume overload -IV Lasix x 1 7/21 but still remaned overloaded so Lasix q 12 hrs for 4 doses given with significant improvement in symptoms  -has been weaned from VM oxygen to [place]. Weaned further. -CXR 7/18 had worsened and suspected aspiration due to recent AMS -swallow eval revealed dysphagia/SLP following -DNR so would not pursue intubation  Wean O2  Fall with the following injuries:   A) distal Femur fracture, right, s/p IM nail   B) Fracture of femoral neck, left, s/p hemiarthroplasty  Bed to chair transfers  NWB R leg  WBAT L leg for transfers  L posterior hip precautions  Dressing changes prn  TED hose for swelling   Hypokalemia Resolved  Severe LUE edema Resolved. DVT ruled out  Severe sepsis with  septic shock due to E. coli and Proteus UTI  -initially felt to have non infectious pyuria but cx this admit has revealed both E. Coli and proteus  -empiric Levaquin was dc'd 7/15 -E. Coli/Proteous resistant to fluoroquinolones / sensitive to Rocephin -blood cx's neg -DNR so would not use pressors  -stress dose steroids initiated 7/17 by PCCM and weaned off 7/21 Lost IV access. Has had 9 days rocephin. No sign of ongoing infection. Anticipate SNF 1-2 days. Will D/c abx. D/c all iv meds.  Keep IV out  Acute renal failure Improved.  Anemia / ? Intraoperative blood loss -since was hypotensive was given 1 unit PRBC's 7/17 -RN was unable to contact guardian to obtain blood consent - pt was symptomatic with recent hypotension so she met requirement to proceed with transfusion due to medical necessity  HYPOTHYROIDISM -cont Synthroid  Cryptogenic cirrhosis / Thrombocytopenia -TB <2.0 -Mild elevation AST intially likely low perfusion from [facility] -baseline platelets ~80-90,000 with nadir in the 30's -variable trend with most recent 56,000 -?? Lower platelets due to blood loss vs GN sepsis  No Lovenox or heparin products do to thrombocytopenia. Check CBC tomorrow Was on Xifaxan prior to admission. Will reorder.  Right shoulder pain: Has full range of motion. At one point, it Dr. Benjamine Mola wrote in her note plan to x-ray. I don't see that it has been done. Will order.  Paroxysmal atrial fibrillation -maintaining NSR  Diabetes mellitus type 2, controlled -cont SSI  Encephalopathy acute / DEMENTIA  -multifactorial due to severe infection/sepsis and hypotension -Ammonia this admit was 48 See above  GERD -PPI but cautiously since on anbx's and incr risk for developing C. diff  Chronic pain: Patient was on lower to and oxycodone prior to admission. Will resume Lyrica.  Her Ms. Rennis Harding, Ms. Casserly had not been ED much in the step down unit. She is not eating much here. Will start Marinol to change  in sure to 3 times a day.  DC F[place]. If stable, can transferred back to skilled nursing facility tomorrow, or when bed available  DVT prophylaxis: SCDs Code Status: DO NOT RESUSCITATE Family Communication: Patient has a son but a legal guardian manages her clinical decisions: Pt's guardian, [name] ([phone] with DSS [employer]. Have NOT spoken with any family or a guardian since admit and have NOT received any calls requesting information or updates Disposition Plan: Stable. Will arrange transfer to nursing home.  Consultants: Orthopedic PCCM signed off  Procedures: Right intramedullary femoral nailing, removal of hardware on right, left hip arthroplasty  Dr. Carola Frost 01/17/13  Antibiotics: Levaquin 7/14 >>> 7/15 Vancomycin 7/14 >>> 7/15 Rocephin 7/17 >>> 7/26  HPI/Subjective: Complained to the nurse of right shoulder pain. Mainly complaining of heel pain currently. Requesting her heels be off the bed. No dyspnea.  Objective: Blood pressure 113/36, pulse 69, temperature 99.3 F (37.4 C), temperature source Oral, resp. rate 16, height 5' (1.524 m), weight 78 kg (171 lb 15.3 oz), SpO2 94.00%.  Intake/Output Summary (Last 24 hours) at 01/29/13 0949 Last data filed at 01/29/13 0602  Gross per 24 hour  Intake 1621.67 ml  Output    575 ml  Net 1046.67 ml    Exam: General: Alert. Asking for pain medication. Lungs: Clear to auscultation bilaterally without wheeze rhonchi or rales Cardiovascular: Regular rate and rhythm without murmur gallop or rub  Abdomen:  nondistended, soft, hypoactive bowel sounds positive, no rebound, no ascites, no appreciable mass Musculoskeletal: No significant cyanosis, clubbing of bilateral lower extremities. Right shoulder with full range of motion and no tenderness  Scheduled Meds: Scheduled Meds: . antiseptic oral rinse  15 mL Mouth Rinse BID  . chlorhexidine  15 mL Mouth/Throat BID  . donepezil  10 mg Oral QHS  . dronabinol  2.5  mg Oral BID AC  . feeding supplement  237 mL Oral TID BM  . insulin aspart  0-9 Units Subcutaneous TID WC  . levothyroxine  75 mcg Oral QAC breakfast  . pantoprazole  40 mg Oral Daily  . polyethylene glycol  17 g Oral Daily  . pregabalin  50 mg Oral TID  . rifaximin  550 mg Oral Daily   Data Reviewed: Basic Metabolic Panel:  Recent Labs Lab 01/24/13 0500 01/25/13 0455 01/26/13 0445 01/27/13 0436 01/28/13 0520  NA 146* 144 143 139 136  K 3.2* 3.1* 2.6* 3.8 4.1  CL 116* 113* 104 100 98  CO2 22 25 28 31  34*  GLUCOSE 91 98 120* 119* 105*  BUN 37* 29* 25* 22 18  CREATININE 1.38* 1.35* 1.45* 1.35* 1.36*  CALCIUM 7.9* 7.4* 7.5* 7.9* 8.0*   Liver Function Tests:  Recent Labs Lab 01/27/13 0436  AST 36  ALT 25  ALKPHOS 123*  BILITOT 1.6*  PROT 5.2*  ALBUMIN 2.2*   No results found for this basename: AMMONIA,  in the last 168 hours CBC:  Recent Labs Lab 01/24/13 0500 01/24/13 1015 01/25/13 0455 01/27/13 0436  WBC 6.3 8.6 6.3 7.9  HGB 8.6* 9.0* 8.5* 9.8*  HCT 26.1* 26.8* 25.8* 30.3*  MCV 89.1 88.2 89.3  91.5  PLT 74* 115* 68* 56*   Cardiac Enzymes: No results found for this basename: CKTOTAL, CKMB, CKMBINDEX, TROPONINI,  in the last 168 hours CBG:  Recent Labs Lab 01/28/13 0635 01/28/13 1217 01/28/13 1709 01/28/13 2117 01/29/13 0633  GLUCAP 108* 133* 94 94 95    Recent Results (from the past 240 hour(s))  CULTURE, BLOOD (ROUTINE X 2)     Status: None   Collection Time    01/20/13 10:30 AM      Result Value Range Status   Specimen Description BLOOD ARM RIGHT   Final   Special Requests BOTTLES DRAWN AEROBIC ONLY 5.0CC   Final   Culture  Setup Time 01/20/2013 16:42   Final   Culture NO GROWTH 5 DAYS   Final   Report Status 01/26/2013 FINAL   Final  CULTURE, BLOOD (ROUTINE X 2)     Status: None   Collection Time    01/20/13 10:40 AM      Result Value Range Status   Specimen Description BLOOD HAND RIGHT   Final   Special Requests BOTTLES DRAWN AEROBIC  ONLY 3.0CC   Final   Culture  Setup Time 01/20/2013 16:42   Final   Culture NO GROWTH 5 DAYS   Final   Report Status 01/26/2013 FINAL   Final    01/29/2013, 9:49 AM   LOS: 13 days    Maxmillian Carsey L,MD [phone]  01/29/2013, 9:49 AM

## 2013-01-30 DIAGNOSIS — IMO0002 Reserved for concepts with insufficient information to code with codable children: Secondary | ICD-10-CM | POA: Clinically undetermined

## 2013-01-30 DIAGNOSIS — B372 Candidiasis of skin and nail: Secondary | ICD-10-CM | POA: Clinically undetermined

## 2013-01-30 DIAGNOSIS — E46 Unspecified protein-calorie malnutrition: Secondary | ICD-10-CM | POA: Clinically undetermined

## 2013-01-30 DIAGNOSIS — R400 Somnolence: Secondary | ICD-10-CM

## 2013-01-30 LAB — GLUCOSE, CAPILLARY

## 2013-01-30 LAB — CBC
Hemoglobin: 9.3 g/dL — ABNORMAL LOW (ref 12.0–15.0)
MCH: 29 pg (ref 26.0–34.0)
Platelets: 80 10*3/uL — ABNORMAL LOW (ref 150–400)
RBC: 3.21 MIL/uL — ABNORMAL LOW (ref 3.87–5.11)
WBC: 4.9 10*3/uL (ref 4.0–10.5)

## 2013-01-30 LAB — BASIC METABOLIC PANEL
CO2: 32 mEq/L (ref 19–32)
Calcium: 8.1 mg/dL — ABNORMAL LOW (ref 8.4–10.5)
Potassium: 4.5 mEq/L (ref 3.5–5.1)
Sodium: 135 mEq/L (ref 135–145)

## 2013-01-30 MED ORDER — FLUCONAZOLE 100 MG PO TABS
100.0000 mg | ORAL_TABLET | Freq: Every day | ORAL | Status: DC
  Administered 2013-01-30 – 2013-01-31 (×2): 100 mg via ORAL
  Filled 2013-01-30 (×2): qty 1

## 2013-01-30 MED ORDER — OXYCODONE HCL 5 MG/5ML PO SOLN
5.0000 mg | ORAL | Status: DC | PRN
  Administered 2013-01-30 – 2013-01-31 (×3): 5 mg via ORAL
  Filled 2013-01-30 (×3): qty 5

## 2013-01-30 MED ORDER — SODIUM CHLORIDE 0.9 % IV BOLUS (SEPSIS)
250.0000 mL | Freq: Once | INTRAVENOUS | Status: AC
  Administered 2013-01-30: 250 mL via INTRAVENOUS

## 2013-01-30 NOTE — Progress Notes (Signed)
[facility]  [name] ZOX:[id number] DOB: January 04, 1923 DOA: 01/16/2013 PCP: Toma Deiters, MD  Brief narrative: 79 y.o. female with history of cryptogenic cirrhosis, thrombocytopenia, GERD, achalasia, history of aspiration pneumonia in July of 2013 at [facility], dementia, hypothyroidism, hypertension, and depression who presented after falling at the SNF.  Patient provided most of the history at time of admission. She reported that after her lunch at around 1:30 p.m. an aide was helping her out of a chair. She reported that the aides legs and her legs got tangled up and she had a mechanical fall. She immediately had right leg and left hip pain. She was brought to the emergency department and was found to have right distal femur fracture and left femoral subcapital fracture.  After operative repair/ R femoral nailing, L arthroplasty she was taken to 2300 ICU intubated and PCCM assumed care.  She was extubated within 24 hours and watched in the unit an additional 24 hours before transferring out to the SDU.  Assessment/Plan:  Somnolence: resumed lyrica yesterday, and added marinol. Also clinically dehydrated. Stop sedating meds hydrate gently and reasses in am. Possibly to SNF  Acute post op respiratory failure developed hypoxia, wheezing and mild tachypnea in setting of apparent volume overload -IV Lasix x 1 7/21 but still remaned overloaded so Lasix q 12 hrs for 4 doses given with significant improvement in symptoms  -has been weaned from VM oxygen to [place]. Weaned further. -CXR 7/18 had worsened and suspected aspiration due to recent AMS -swallow eval revealed dysphagia/SLP following -DNR so would not pursue intubation  Wean O2  Fall with the following injuries:   A) distal Femur fracture, right, s/p IM nail   B) Fracture of femoral neck, left, s/p hemiarthroplasty  Bed to chair transfers  NWB R leg  WBAT L leg for transfers  L posterior hip precautions  Dressing  changes prn  TED hose for swelling   Hypokalemia Resolved  Severe LUE edema Resolved. DVT ruled out  Severe sepsis with septic shock due to E. coli and Proteus UTI  -initially felt to have non infectious pyuria but cx this admit has revealed both E. Coli and proteus  -empiric Levaquin was dc'd 7/15 -E. Coli/Proteous resistant to fluoroquinolones / sensitive to Rocephin -blood cx's neg -DNR so would not use pressors  -stress dose steroids initiated 7/17 by PCCM and weaned off 7/21 Lost IV access. Has had 9 days rocephin. No sign of ongoing infection. Anticipate SNF 1-2 days. Will D/c abx. D/c all iv meds.  Keep IV out  Acute renal failure Improved.  Anemia / ? Intraoperative blood loss -since was hypotensive was given 1 unit PRBC's 7/17 -RN was unable to contact guardian to obtain blood consent - pt was symptomatic with recent hypotension so she met requirement to proceed with transfusion due to medical necessity  HYPOTHYROIDISM -cont Synthroid  Cryptogenic cirrhosis / Thrombocytopenia -TB <2.0 -Mild elevation AST intially likely low perfusion from [facility] -baseline platelets ~80-90,000 with nadir in the 30's -variable trend with most recent 56,000 -?? Lower platelets due to blood loss vs GN sepsis  No Lovenox or heparin products do to thrombocytopenia. Check CBC tomorrow Was on Xifaxan prior to admission. Will reorder.  Right shoulder pain: Has full range of motion. At one point, it Dr. Benjamine Mola wrote in her note plan to x-ray. I don't see that it has been done. Will order.  Paroxysmal atrial fibrillation -maintaining NSR  Diabetes mellitus type 2, controlled -cont SSI  Encephalopathy acute / DEMENTIA  -multifactorial due to severe infection/sepsis and hypotension -Ammonia this admit was 48 See above  GERD -PPI but cautiously since on anbx's and incr risk for developing C. diff  Chronic pain: Patient was on lower to and oxycodone prior to admission. Will resume  Lyrica.  Her Ms. Rennis Harding, Ms. Stallsmith had not been ED much in the step down unit. She is not eating much here. Will start Marinol to change in sure to 3 times a day. Severe intertrigo: nystatin topical and oral diflucan  DVT prophylaxis: SCDs Code Status: DO NOT RESUSCITATE Family Communication: Patient has a son but a legal guardian manages her clinical decisions: Pt's guardian, Barnie Alderman ([phone]) with DSS [employer]. Have NOT spoken with any family or a guardian since admit and have NOT received any calls requesting information or updates Disposition Plan: Stable. Will arrange transfer to nursing home.  Consultants: Orthopedic PCCM signed off  Procedures: Right intramedullary femoral nailing, removal of hardware on right, left hip arthroplasty  Dr. Carola Frost 01/17/13  Antibiotics: Levaquin 7/14 >>> 7/15 Vancomycin 7/14 >>> 7/15 Rocephin 7/17 >>> 7/26  HPI/Subjective: unable  Objective: Blood pressure 118/88, pulse 73, temperature 99 F (37.2 C), temperature source Oral, resp. rate 16, height 5' (1.524 m), weight 78 kg (171 lb 15.3 oz), SpO2 96.00%.  Intake/Output Summary (Last 24 hours) at 01/30/13 1004 Last data filed at 01/29/13 1900  Gross per 24 hour  Intake    240 ml  Output    200 ml  Net     40 ml    Exam: General: somnolent. Arousable, but falls immediately back asleep Lungs: Clear to auscultation bilaterally without wheeze rhonchi or rales Cardiovascular: Regular rate and rhythm without murmur gallop or rub  Abdomen:  nondistended, soft, hypoactive bowel sounds positive, no rebound, no ascites, no appreciable mass Musculoskeletal: No significant cyanosis, clubbing of bilateral lower extremities. Right shoulder with full range of motion and no tenderness Skin: erythema groin and perineum  Scheduled Meds: Scheduled Meds: . antiseptic oral rinse  15 mL Mouth Rinse BID  . chlorhexidine  15 mL Mouth/Throat BID  . donepezil  10 mg Oral QHS  .  feeding supplement  237 mL Oral TID BM  . fluconazole  100 mg Oral Daily  . insulin aspart  0-9 Units Subcutaneous TID WC  . levothyroxine  75 mcg Oral QAC breakfast  . nystatin   Topical BID  . pantoprazole  40 mg Oral Daily  . polyethylene glycol  17 g Oral Daily  . rifaximin  550 mg Oral Daily  . sodium chloride  250 mL Intravenous Once   Data Reviewed: Basic Metabolic Panel:  Recent Labs Lab 01/25/13 0455 01/26/13 0445 01/27/13 0436 01/28/13 0520 01/30/13 0624  NA 144 143 139 136 135  K 3.1* 2.6* 3.8 4.1 4.5  CL 113* 104 100 98 99  CO2 25 28 31  34* 32  GLUCOSE 98 120* 119* 105* 103*  BUN 29* 25* 22 18 20   CREATININE 1.35* 1.45* 1.35* 1.36* 1.52*  CALCIUM 7.4* 7.5* 7.9* 8.0* 8.1*   Liver Function Tests:  Recent Labs Lab 01/27/13 0436  AST 36  ALT 25  ALKPHOS 123*  BILITOT 1.6*  PROT 5.2*  ALBUMIN 2.2*   No results found for this basename: AMMONIA,  in the last 168 hours CBC:  Recent Labs Lab 01/24/13 0500 01/24/13 1015 01/25/13 0455 01/27/13 0436 01/30/13 0624  WBC 6.3 8.6 6.3 7.9 4.9  HGB 8.6* 9.0* 8.5*  9.8* 9.3*  HCT 26.1* 26.8* 25.8* 30.3* 30.3*  MCV 89.1 88.2 89.3 91.5 94.4  PLT 74* 115* 68* 56* 80*   Cardiac Enzymes: No results found for this basename: CKTOTAL, CKMB, CKMBINDEX, TROPONINI,  in the last 168 hours CBG:  Recent Labs Lab 01/29/13 0633 01/29/13 1114 01/29/13 1549 01/30/13 0158 01/30/13 0612  GLUCAP 95 93 133* 112* 98    Recent Results (from the past 240 hour(s))  CULTURE, BLOOD (ROUTINE X 2)     Status: None   Collection Time    01/20/13 10:30 AM      Result Value Range Status   Specimen Description BLOOD ARM RIGHT   Final   Special Requests BOTTLES DRAWN AEROBIC ONLY 5.0CC   Final   Culture  Setup Time 01/20/2013 16:42   Final   Culture NO GROWTH 5 DAYS   Final   Report Status 01/26/2013 FINAL   Final  CULTURE, BLOOD (ROUTINE X 2)     Status: None   Collection Time    01/20/13 10:40 AM      Result Value Range  Status   Specimen Description BLOOD HAND RIGHT   Final   Special Requests BOTTLES DRAWN AEROBIC ONLY 3.0CC   Final   Culture  Setup Time 01/20/2013 16:42   Final   Culture NO GROWTH 5 DAYS   Final   Report Status 01/26/2013 FINAL   Final    01/30/2013, 10:04 AM   LOS: 14 days    Dishawn Bhargava L,MD [phone]  01/30/2013, 10:04 AM

## 2013-01-31 DIAGNOSIS — B372 Candidiasis of skin and nail: Secondary | ICD-10-CM

## 2013-01-31 DIAGNOSIS — R627 Adult failure to thrive: Secondary | ICD-10-CM

## 2013-01-31 DIAGNOSIS — E46 Unspecified protein-calorie malnutrition: Secondary | ICD-10-CM

## 2013-01-31 LAB — BASIC METABOLIC PANEL
Chloride: 101 mEq/L (ref 96–112)
GFR calc Af Amer: 34 mL/min — ABNORMAL LOW (ref >90)
Potassium: 4.2 mEq/L (ref 3.5–5.1)
Sodium: 138 mEq/L (ref 135–145)

## 2013-01-31 LAB — GLUCOSE, CAPILLARY
Glucose-Capillary: 102 mg/dL — ABNORMAL HIGH (ref 70–99)
Glucose-Capillary: 140 mg/dL — ABNORMAL HIGH (ref 70–99)

## 2013-01-31 LAB — AMMONIA: Ammonia: 50 umol/L (ref 11–60)

## 2013-01-31 MED ORDER — PROMETHAZINE HCL 25 MG PO TABS: 12.5000 mg | ORAL_TABLET | Freq: Four times a day (QID) | ORAL | Status: AC | PRN

## 2013-01-31 MED ORDER — BIOTENE DRY MOUTH MT LIQD: 15.0000 mL | Freq: Two times a day (BID) | OROMUCOSAL | Status: DC

## 2013-01-31 MED ORDER — ACETAMINOPHEN 325 MG PO TABS: 650.0000 mg | ORAL_TABLET | Freq: Four times a day (QID) | ORAL | Status: DC | PRN

## 2013-01-31 MED ORDER — ZINC OXIDE 20 % EX OINT: 1.0000 "application " | TOPICAL_OINTMENT | Freq: Two times a day (BID) | CUTANEOUS | Status: DC

## 2013-01-31 MED ORDER — ENSURE COMPLETE PO LIQD: 237.0000 mL | Freq: Three times a day (TID) | ORAL | Status: DC

## 2013-01-31 MED ORDER — FLUCONAZOLE 100 MG PO TABS: 100.0000 mg | ORAL_TABLET | Freq: Every day | ORAL | Status: DC

## 2013-01-31 MED ORDER — OXYCODONE HCL 5 MG PO CAPS: 5.0000 mg | ORAL_CAPSULE | ORAL | Status: AC | PRN

## 2013-01-31 MED ORDER — FUROSEMIDE 20 MG PO TABS: 20.0000 mg | ORAL_TABLET | ORAL | Status: AC

## 2013-01-31 MED ORDER — CHLORHEXIDINE GLUCONATE 0.12 % MT SOLN: 15.0000 mL | Freq: Two times a day (BID) | OROMUCOSAL | Status: AC

## 2013-01-31 NOTE — Progress Notes (Signed)
Report called to Angie at [facility] in [place].

## 2013-01-31 NOTE — Discharge Summary (Signed)
Physician Discharge Summary  [name]:[id number] DOB: 02-15-1923 DOA: 01/16/2013  PCP: Toma Deiters, MD  Admit date: 01/16/2013 Discharge date: 01/31/2013  Time spent: 45 min  Discharge Diagnoses:    Fracture of femoral neck, left; s/p hemiarthroplasty   Distal Femur fracture, right: s/p IM nail   HYPOTHYROIDISM   DEMENTIA   GERD   Cryptogenic cirrhosis   Fall   Paroxysmal atrial fibrillation   Thrombocytopenia   Diabetes mellitus type 2, controlled   Acute renal failure   Anemia   E. coli and Proteus UTI (urinary tract infection)   Acute respiratory failure   Encephalopathy acute   Severe sepsis with septic shock   Failure to thrive   Unspecified protein-calorie malnutrition   Somnolence   Candidal intertrigo Chronic pain  Discharge Condition: Stable  Filed Weights   01/18/13 0500 01/20/13 0409 01/30/13 1037  Weight: 78.5 kg (173 lb 1 oz) 78 kg (171 lb 15.3 oz) 78.1 kg (172 lb 2.9 oz)    History of present illness:  [name] is a 79 y.o. Caucasian female with history of cryptogenic cirrhosis, thrombocytopenia, GERD, achalasia, history of aspiration pneumonia in July of 2013 at [facility], dementia, hypothyroidism, hypertension, and depression who presents with the above complaints. Patient provided most of the history. She reported that after her lunch at around 130 p.m. an aide was helping out at the chair. She reported that the aides legs and her legs got tangled up and she had a mechanical fall. She immediately had right leg and left hip pain. She was brought to the emergency department and was found to have right distal femur fracture and left femoral subcapital fracture. Hospitalist service was asked to admit the patient for further care and management. Patient denies any recent fevers, chills, nausea, vomiting, chest pain, shortness of breath, abdominal pain, diarrhea, headaches, or new vision changes. Reviewing patient's records from the  skilled nursing facility, [facility], patient has a guardian and may be a ward of the state, Barnie Alderman is patient's guardian social worker [phone] Ext [phone].  Hospital Course:  Patient was admitted to the hospitalist service and orthopedics consulted. Patient was taken to the operating room on 7/15 by Dr. Carola Frost who performed the following:  1. Retrograde nailing of the right femur using a Biomet [place] 12 x  340 mm nail statically locked.  2. Left hip hemiarthroplasty using a [employer] [place] Basic press-fit  standard neck and unipolar 44 mm head.  3. Removal of deep implant, right femur DHS.  Postoperatively, patient was too sedated to be extubated. She was therefore kept on a ventilator and managed by critical care medicine. She was extubated within 24 hours.  On 7/17, patient became hypotensive, requiring fluid resuscitation and vasopressors. She has severe sepsis with shock secondary to Escherichia coli and Proteus urinary tract infection. She was maintained on antibiotics. She has chronic anemia and acute blood loss anemia and was transfused packed red blood cells. During this time, her creatinine also increased. And she had an episode of pulmonary edema. She had been on Lasix prior to admission and she responded well to Lasix. Eventually, her pressors were weaned. She has completed her course of Rocephin. She remains normotensive. Her renal function is about back to baseline. No evidence of pulmonary edema, though she does have peripheral edema. She has fairly severe intertrigo of the groin area and we are treating with nystatin topically and about 5 days of fluconazole. Patient's intake has been  marginal, and she will need encouragement to eat. Her Lyrica was stopped due to sedation. It appears she has chronic pain in requests pain medication frequently. She has been working with physical therapy. She has been maintained on TED hose and sequential compression devices for DVT prophylaxis.   due to her chronic thrombocytopenia and cirrhosis, she is not safe for low molecular weight are unfractionated heparin products.   Orthopedics has cleared patient for discharge. She can have her staples removed this week, and will followup with Dr. Carola Frost in 10 days. She will need continued monitoring of her renal function, anemia, diabetes, oral intake, weight and fluid status. She will transfer back to skilled nursing facility.  Procedures:  (i.e. Studies not automatically included, echos, thoracentesis, etc; not x-rays)Retrograde nailing of the right femur using a Biomet [place] 12 x  340 mm nail statically locked.  2. Left hip hemiarthroplasty using a De[employer]uy [place] Basic press-fit  standard neck and unipolar 44 mm head.  3. Removal of deep implant, right femur DHS. ETT 7/14 >>> 7/15  L rad A-line 7/14 >> 7/15  L IJ CVL  Consultations:  Handy, orthopedics  Critical Care medicine  Discharge Exam: Filed Vitals:   01/30/13 1037 01/30/13 1500 01/30/13 2036 01/31/13 0625  BP:  101/25 105/32 99/27  Pulse:  70 66 59  Temp:  99.9 F (37.7 C) 99.2 F (37.3 C) 98.1 F (36.7 C)  TempSrc:   Oral Oral  Resp:  18 16 14   Height:      Weight: 78.1 kg (172 lb 2.9 oz)     SpO2:  90% 97% 91%    General: alert, at baseline mental status Cardiovascular: RRR without WRR Respiratory: CTA without WRR GU: groin and perineum still very red and macerated Ext: edema  Discharge Instructions  Discharge Orders   Future Orders Complete By Expires     Change dressing (specify)  As directed     Comments:      As needed for soilage    Discharge instructions  As directed     Comments:      Weigh daily. MD to adjust lasix if needed. Monitor Basic metabolic panel and CBC within one week.    Discharge instructions  As directed     Comments:      Mechanical soft diet. Encourage intake.  D/c staples wednesday    Non weight bearing  As directed     Comments:      Right leg    PT range of motion  As  directed 01/27/2014    Comments:      Unrestricted ROM R hip, knee, ankle ROM as tolerated L knee and ankle Posterior hip precautions L hip    Posterior total hip precautions  As directed     Comments:      Left    Weight bearing as tolerated  As directed     Comments:      Left leg        Medication List    STOP taking these medications       cetirizine 10 MG tablet  Commonly known as:  ZYRTEC     docusate sodium 100 MG capsule  Commonly known as:  COLACE     pregabalin 50 MG capsule  Commonly known as:  LYRICA      TAKE these medications       acetaminophen 325 MG tablet  Commonly known as:  TYLENOL  Take 2 tablets (650 mg total) by  mouth every 6 (six) hours as needed.     antiseptic oral rinse Liqd  15 mLs by Mouth Rinse route 2 (two) times daily.     ascorbic acid 500 MG tablet  Commonly known as:  VITAMIN C  Take 500 mg by mouth daily.     calcium carbonate 600 MG Tabs  Commonly known as:  OS-CAL  Take 600 mg by mouth 2 (two) times daily with a meal.     chlorhexidine 0.12 % solution  Commonly known as:  PERIDEX  Use as directed 15 mLs in the mouth or throat 2 (two) times daily.     donepezil 10 MG tablet  Commonly known as:  ARICEPT  Take 10 mg by mouth at bedtime.     feeding supplement Liqd  Take 237 mLs by mouth 3 (three) times daily between meals.     fluconazole 100 MG tablet  Commonly known as:  DIFLUCAN  Take 1 tablet (100 mg total) by mouth daily. For 3 more days, then stop     furosemide 20 MG tablet  Commonly known as:  LASIX  Take 1 tablet (20 mg total) by mouth every other day. Takes at lunchtime     hydrocortisone cream-nystatin cream-zinc oxide  Apply 1 application topically 2 (two) times daily. To red areas on groin/perineum     levothyroxine 75 MCG tablet  Commonly known as:  SYNTHROID, LEVOTHROID  Take 75 mcg by mouth daily.     magnesium hydroxide 400 MG/5ML suspension  Commonly known as:  MILK OF MAGNESIA  Take 5 mLs by  mouth daily as needed for constipation.     multivitamin with minerals Tabs  Take 1 tablet by mouth daily.     NOVOLOG 100 UNIT/ML injection  Generic drug:  insulin aspart  Inject 0-9 Units into the skin 3 (three) times daily with meals. 70-120=0units, 121-150=1units, 151-200=2units, 201-250=3units, 251-300=5units, 301-350=7units, 351-400=9units >401=Call MD     omeprazole 20 MG capsule  Commonly known as:  PRILOSEC  Take 20 mg by mouth daily.     oxycodone 5 MG capsule  Commonly known as:  OXY-IR  Take 1 capsule (5 mg total) by mouth every 4 (four) hours as needed for pain.     pantoprazole 40 MG tablet  Commonly known as:  PROTONIX  Take 1 tablet (40 mg total) by mouth daily at 12 noon.     polyethylene glycol packet  Commonly known as:  MIRALAX / GLYCOLAX  Take 17 g by mouth daily.     potassium chloride 10 MEQ CR tablet  Commonly known as:  KLOR-CON  Take 10 mEq by mouth 2 (two) times daily.     promethazine 25 MG tablet  Commonly known as:  PHENERGAN  Take 0.5 tablets (12.5 mg total) by mouth every 6 (six) hours as needed for nausea.     XIFAXAN 550 MG Tabs  Generic drug:  rifaximin  TAKE 2 TABLETS BY MOUTH  ONCE DAILY.       Allergies  Allergen Reactions  . Aspirin     hives  . Codeine     REACTION: UNKNOWN REACTION  . Morphine And Related Other (See Comments)    "completely knocks her out"  . Penicillins   . Sulfonamide Derivatives     REACTION: UNKNOWN REACTION  . Zolpidem Tartrate     REACTION: UNKNOWN REACTION       Follow-up Information   Follow up with Budd Palmer, MD. Schedule an appointment as soon  as possible for a visit in 10 days. (call for appointment )    Contact information:   [address] [place]lyn [place] [address] [phone]        The results of significant diagnostics from this hospitalization (including imaging, microbiology, ancillary and laboratory) are listed below for reference.     Significant Diagnostic Studies: Dg Chest 1 View  01/16/2013   *RADIOLOGY REPORT*  Clinical Data: Fall  CHEST - 1 VIEW  Comparison: 02/01/2012  Findings: Single view of the chest was obtained.  No evidence for a pneumothorax.  Stable appearance of the heart and mediastinum. Streaky densities in the left upper lung could represent overlying bones and lung markings.  Otherwise, there is no focal airspace disease.  Degenerative changes in the thoracic spine.  IMPRESSION: Streaky densities in the left upper lung may represent overlying shadows and chronic changes.  Findings are nonspecific.   Original Report Authenticated By: Richarda Overlie, M.D.   Dg Hip Complete Left  01/16/2013   *RADIOLOGY REPORT*  Clinical Data: Fall and suspected hip fracture.  LEFT HIP - COMPLETE 2+ VIEW  Comparison: 04/18/2011 and 04/23/2011  Findings: There is a new fracture involving the left proximal femur.  Findings are most compatible with a subcapital fracture. The distal aspect is superiorly displaced.  Again noted is internal fixation of the proximal right femur from previous fracture. Pelvic bony ring appears to be intact.  The left hip is located.  IMPRESSION: Displaced left femoral subcapital fracture.   Original Report Authenticated By: Richarda Overlie, M.D.   Dg Femur Right  01/17/2013   *RADIOLOGY REPORT*  Clinical Data: 79 year old female undergoing ORIF distal right femur.  DG C-ARM 61-120 MIN, RIGHT FEMUR - 2 VIEW  Comparison: 01/16/2013.  Fluoroscopy time:  1 minute 0 seconds  Findings: Four intraoperative fluoroscopic views demonstrating addition of a right femur intramedullary rod to the preexisting proximal right femur dynamic hip screw.  Proximal and distal interlocking hardware.  The rod traverses the comminuted spiral distal femur fracture.  Improved alignment of fracture fragment. Hardware appears intact.  IMPRESSION: ORIF right femur with no adverse features identified.   Original Report Authenticated By: Erskine Speed,  M.D.   Dg Femur Right  01/16/2013   *RADIOLOGY REPORT*  Clinical Data: Fall and suspected right femur fracture.  RIGHT FEMUR - 2 VIEW  Comparison: 04/23/2011 and 01/28/2012  Findings: Dynamic hip screw in the proximal right femur.  There is irregularity of the right femoral neck and right femoral trochanters.  This is probably from the previous injury and appears unchanged from 01/28/2012.  There is a new displaced oblique fracture in the distal right femur.  Fracture appears be mildly comminuted.  Fracture is medially displaced.  The right hip is located.  Fracture is in the region of the distal diaphysis. Probable suprapatellar joint effusion.  IMPRESSION: Displaced comminuted fracture of the distal right femur.   Original Report Authenticated By: Richarda Overlie, M.D.   Ct Knee Right Wo Contrast  01/16/2013   *RADIOLOGY REPORT*  Clinical Data: Status post fall.  Right femur fracture.  CT OF THE RIGHT KNEE WITHOUT CONTRAST  Technique:  Multidetector CT imaging was performed according to the standard protocol. Multiplanar CT image reconstructions were also generated.  Comparison: Plain films earlier this same date.  Findings: The patient has a highly comminuted fracture of the distal diaphysis of the right femur.  The fracture is oblique in orientation originating 12.5 cm superior to the  medial femoral condyle and extending in an inferior and oblique orientation through the lateral cortex of the diaphysis of the distal femur 5 cm above the lateral condyle.  There is fragment override of up to 3 cm.  There is one half shaft width medial displacement and fragment override about the 3 cm.  The fracture does not extend to the knee joint. No other fracture is identified.  There is no knee joint effusion.  The patient has osteoarthritis about the knee. Soft tissue structures demonstrate hematoma about the patient's fracture.  IMPRESSION: Highly comminuted fracture of the distal diaphysis of the right femur as described.   The fracture does not involve the knee joint.   Original Report Authenticated By: Holley Dexter, M.D.   Dg Pelvis Portable  01/18/2013   *RADIOLOGY REPORT*  Clinical Data: Postoperative radiograph; status post right femoral intramedullary rod placement and left hip arthroplasty.  PORTABLE PELVIS  Comparison: Intraoperative radiograph performed earlier today at 05:27 p.m., and right femur radiographs performed 01/16/2013  Findings: There has been interval placement of an intramedullary rod within the right femur, and left hip arthroplasty.  No new fractures are seen. The prior right hip screw is unchanged in appearance.  Overlying postoperative change and soft tissue air are seen.  The visualized bowel gas pattern is grossly unremarkable.  IMPRESSION: Status post placement of intramedullary rod within the right femur, and left hip arthroplasty.  No new fractures seen at the pelvis.   Original Report Authenticated By: Tonia Ghent, M.D.   Dg Chest Port 1 View  01/23/2013   *RADIOLOGY REPORT*  Clinical Data: Short of breath  PORTABLE CHEST - 1 VIEW  Comparison: Chest radiograph 01/21/2013  Findings: Left side central venous line is unchanged.  Stable enlarged heart silhouette.  There are bilateral pleural effusions and basilar atelectasis not changed.  There is central venous congestion.  Low lung volumes.  IMPRESSION:  1.  No significant change. 2.  Bibasilar effusions and atelectasis. 3.  Central venous congestion.   Original Report Authenticated By: Genevive Bi, M.D.   Dg Chest Port 1 View  01/21/2013   *RADIOLOGY REPORT*  Clinical Data: Shortness of breath, hypoxia  PORTABLE CHEST - 1 VIEW  Comparison: Portable exam 1008 hours compared to 01/20/2013  Findings: Right jugular central venous catheter tip projects over SVC. Enlargement of cardiac silhouette. Stable mediastinal contours. Prominent hila particularly on the left. Peribronchial thickening with perihilar infiltrates increased since previous  exam, extending in the bases, question developing pulmonary edema or infection. Small basilar effusions not excluded. No pneumothorax or acute osseous finding.  IMPRESSION: Increasing perihilar and basilar infiltrates question edema versus infection. Enlargement of cardiac silhouette.   Original Report Authenticated By: Ulyses Southward, M.D.   Dg Chest Port 1 View  01/20/2013   *RADIOLOGY REPORT*  Clinical Data: Progressive hypoxia  PORTABLE CHEST - 1 VIEW  Comparison: Portable exam 0749 hours compared to 01/18/2013  Findings: Rotated to the right. Interval removal of endotracheal and nasogastric tubes. Left jugular line stable, tip projecting over SVC. Mild enlargement of cardiac silhouette. Mediastinal contours and pulmonary vascularity normal. Mild bibasilar atelectasis or infiltrate. Remaining lungs clear. Calcified granuloma left mid lung stable. No definite pleural effusion or pneumothorax.  IMPRESSION: Persistent mild bibasilar atelectasis or infiltrate.   Original Report Authenticated By: Ulyses Southward, M.D.   Dg Chest Port 1 View  01/18/2013   *RADIOLOGY REPORT*  Clinical Data: Respiratory difficulty  PORTABLE CHEST - 1 VIEW  Comparison: Yesterday  Findings: Endotracheal tube has  retracted.  Valve the tip is 2.2 cm from the carina.  Stable left internal jugular central venous catheter.  NG tube placed beyond the gastroesophageal junction. Heart is enlarged.  Vascular congestion without edema has increased.  Left basilar opacity has developed compatible with atelectasis verses airspace disease.  Small left pleural effusion is not excluded.  IMPRESSION: Increasing vascular congestion.  Increasing left lower lobe atelectasis verses airspace disease. Left pleural effusion is not excluded.   Original Report Authenticated By: Jolaine Click, M.D.   Dg Chest Port 1 View  01/18/2013   *RADIOLOGY REPORT*  Clinical Data: Postoperative radiograph; status post central line placement.  PORTABLE CHEST - 1 VIEW  Comparison:  Chest radiograph performed 01/16/2013  Findings: There has been placement of a left IJ line, noted ending about the proximal to mid SVC.  The patient's endotracheal tube is seen ending 1 cm above the carina; this should be retracted 1-2 cm.  The lungs are relatively well expanded.  Vascular congestion is again noted.  Left basilar airspace opacity may reflect atelectasis.  Mild interstitial edema cannot be excluded.  A small left pleural effusion may be present.  No pneumothorax is seen.  The cardiomediastinal silhouette is mildly enlarged.  No acute osseous abnormalities are identified.  IMPRESSION:  1.  Left IJ line noted ending about the proximal to mid SVC. 2.  Endotracheal tube seen ending 1 cm above the carina; this should be retracted 1-2 cm. 3.  Left basilar airspace opacity may reflect atelectasis. Vascular congestion and mild cardiomegaly noted, with mildly increased interstitial markings, possibly reflecting mild interstitial edema.  Question of small left pleural effusion.  These results were called by telephone on 01/18/2013 at 02:31 a.m. to Nursing on [id number], who verbally acknowledged these results.   Original Report Authenticated By: Tonia Ghent, M.D.   Dg Shoulder Right Port  01/29/2013   *RADIOLOGY REPORT*  Clinical Data: History of fall complaining of shoulder pain.  PORTABLE RIGHT SHOULDER - 2+ VIEW  Comparison: No priors.  Findings: Study is limited by suboptimal nonstandard views (per report from the technologist, the patient was uncooperative).  With these limitations in mind, there is no definite acute displaced fracture, subluxation or dislocation.  Degenerative changes of osteoarthritis are noted at the acromioclavicular joint.  IMPRESSION: 1.  Limited examination demonstrating no definite acute radiographic abnormality of the right shoulder.   Original Report Authenticated By: Trudie Reed, M.D.   Dg Shoulder Right Port  01/17/2013   *RADIOLOGY REPORT*  Clinical Data: Pain  after fall  PORTABLE RIGHT SHOULDER - 2+ VIEW  Comparison: None.  Findings: Three views of the right shoulder submitted.  No acute fracture or subluxation.  Mild degenerative changes right AC joint. Diffuse osteopenia.  IMPRESSION: No acute fracture or subluxation.  Mild degenerative changes right AC joint.   Original Report Authenticated By: Natasha Mead, M.D.   Dg Hip Portable 1 View Left  01/18/2013   *RADIOLOGY REPORT*  Clinical Data: Postop.  PORTABLE LEFT HIP - 1 VIEW  Comparison: 01/16/2013 and 01/17/2013  Findings: There is a left hip arthroplasty.  The left hip appears to be located on this cross-table lateral view.  No evidence for a periprosthetic fracture.  IMPRESSION: Left hip replacement without complicating features.   Original Report Authenticated By: Richarda Overlie, M.D.   Dg Femur Right Port  01/18/2013   *RADIOLOGY REPORT*  Clinical Data: Postop for femur fracture.  PORTABLE RIGHT FEMUR - 2 VIEW  Comparison: 01/16/2013  Findings: A retrograde intramedullary nail  has been placed.  There are four distal interlocking screws.  There is decreased displacement of the distal femur fracture.  Again noted is the old fixation device extending through the femoral head and neck.  IMPRESSION: Internal fixation of the distal femur fracture with a retrograde nail.   Original Report Authenticated By: Richarda Overlie, M.D.   Dg Knee Left Port  01/17/2013   *RADIOLOGY REPORT*  Clinical Data: Fall.  Pain.  Distal right femur fracture.  Left hip fracture.  PORTABLE LEFT KNEE - 1-2 VIEW  Comparison: None.  Findings: The left knee is located.  Mild degenerative changes are again noted.  A small effusion is evident.  Mild osteopenia is noted.  Superficial soft tissue swelling is noted laterally.  IMPRESSION:  1.  Small joint effusion without to an acute osseous abnormality. 2.  Mild degenerative changes. 3.  Superficial soft tissue swelling over the lateral aspect of the left knee.   Original Report Authenticated By:  Marin Roberts, M.D.   Dg Ankle Right Port  01/17/2013   *RADIOLOGY REPORT*  Clinical Data: Fall.  Right ankle pain.  PORTABLE RIGHT ANKLE - 2 VIEW  Comparison: Right ankle radiographs 07/31/2005.  Findings: Extensive bimalleolar soft tissue swelling is present. Mild osteopenia is noted.  No acute osseous abnormality is present. The ankle is located.  A small bone fragment associated with the medial malleolus is stable.  IMPRESSION:  1.  Diffuse edematous changes about the ankle. 2.  No acute osseous abnormality. 3.  Moderate generalized osteopenia. 4.  Remote corticated bone fragment associated with the medial malleolus.   Original Report Authenticated By: Marin Roberts, M.D.   Dg C-arm 61-120 Min  01/17/2013   *RADIOLOGY REPORT*  Clinical Data: 79 year old female undergoing ORIF distal right femur.  DG C-ARM 61-120 MIN, RIGHT FEMUR - 2 VIEW  Comparison: 01/16/2013.  Fluoroscopy time:  1 minute 0 seconds  Findings: Four intraoperative fluoroscopic views demonstrating addition of a right femur intramedullary rod to the preexisting proximal right femur dynamic hip screw.  Proximal and distal interlocking hardware.  The rod traverses the comminuted spiral distal femur fracture.  Improved alignment of fracture fragment. Hardware appears intact.  IMPRESSION: ORIF right femur with no adverse features identified.   Original Report Authenticated By: Erskine Speed, M.D.    Microbiology: No results found for this or any previous visit (from the past 240 hour(s)).   Labs: Basic Metabolic Panel:  Recent Labs Lab 01/26/13 0445 01/27/13 0436 01/28/13 0520 01/30/13 0624 01/31/13 0425  NA 143 139 136 135 138  K 2.6* 3.8 4.1 4.5 4.2  CL 104 100 98 99 101  CO2 28 31 34* 32 32  GLUCOSE 120* 119* 105* 103* 101*  BUN 25* 22 18 20 21   CREATININE 1.45* 1.35* 1.36* 1.52* 1.55*  CALCIUM 7.5* 7.9* 8.0* 8.1* 8.2*   Liver Function Tests:  Recent Labs Lab 01/27/13 0436  AST 36  ALT 25  ALKPHOS  123*  BILITOT 1.6*  PROT 5.2*  ALBUMIN 2.2*   No results found for this basename: LIPASE, AMYLASE,  in the last 168 hours  Recent Labs Lab 01/31/13 0425  AMMONIA 50   CBC:  Recent Labs Lab 01/24/13 1015 01/25/13 0455 01/27/13 0436 01/30/13 0624  WBC 8.6 6.3 7.9 4.9  HGB 9.0* 8.5* 9.8* 9.3*  HCT 26.8* 25.8* 30.3* 30.3*  MCV 88.2 89.3 91.5 94.4  PLT 115* 68* 56* 80*   Cardiac Enzymes: No results found for this basename: CKTOTAL, CKMB, CKMBINDEX, TROPONINI,  in the last 168 hours BNP: BNP (last 3 results) No results found for this basename: PROBNP,  in the last 8760 hours CBG:  Recent Labs Lab 01/30/13 0612 01/30/13 1146 01/30/13 1620 01/30/13 2234 01/31/13 0703  GLUCAP 98 128* 142* 116* 102*    Signed:  Kourtni Stineman L  [facility] 01/31/2013, 8:53 AM [phone]

## 2013-03-23 ENCOUNTER — Ambulatory Visit: Payer: PRIVATE HEALTH INSURANCE | Admitting: Gastroenterology

## 2013-03-28 ENCOUNTER — Encounter: Payer: Self-pay | Admitting: Internal Medicine

## 2013-03-29 ENCOUNTER — Encounter: Payer: Self-pay | Admitting: Gastroenterology

## 2013-03-29 ENCOUNTER — Other Ambulatory Visit: Payer: Self-pay | Admitting: Internal Medicine

## 2013-03-29 ENCOUNTER — Ambulatory Visit (INDEPENDENT_AMBULATORY_CARE_PROVIDER_SITE_OTHER): Payer: PRIVATE HEALTH INSURANCE | Admitting: Gastroenterology

## 2013-03-29 VITALS — BP 97/45 | HR 73 | Temp 97.4°F | Ht 64.5 in

## 2013-03-29 DIAGNOSIS — R1319 Other dysphagia: Secondary | ICD-10-CM

## 2013-03-29 DIAGNOSIS — R1310 Dysphagia, unspecified: Secondary | ICD-10-CM

## 2013-03-29 DIAGNOSIS — K7460 Unspecified cirrhosis of liver: Secondary | ICD-10-CM

## 2013-03-29 NOTE — Progress Notes (Signed)
Referring Provider: Toma Deiters, MD Primary Care Physician:  Toma Deiters, MD Primary GI: Dr. Darrick Penna   Chief Complaint  Patient presents with  . Emesis  . Dysphagia    HPI:   [name] presents today due to persistent dysphagia, regurgitation. She has a history of cirrhosis, GERD, gastroparesis, and food impaction requiring EGD at [facility] in July 2013. Repeat EGD Aug 2013 at [facility] with esophagitis and hypertonic LES. Manometry was recommended but never performed as an outpatient. Hospitalized at [facility] from 7/28-8/7 last year due to respiratory failure.   Presents with her legal guardian, [name]. [name] is a ward of the state. She has seen speech pathology at the facility, who recommended soft mechanical diet. Patient does not comply with this and request regular foods. I do not have the actual consultation report, but speech progress notes continue to recommend GI follow-up due to question of esophageal component.    Liquids go down sometimes. No abdominal pain. Symptoms since last year.  Symptoms worsening since July hospitalization last year. Tries to swallow food but "comes back". Points to cervical area. Pills ok. No pain with swallowing. States water comes back sometimes. Has to have cold ginger ale. No abdominal pain. Appears she was on Cardizem in the remote past due to swallowing difficulties  Denies nausea. Denies reflux. Has good appetite, tries to eat. "I try". No melena.   [name], legal guardian.   Past Medical History  Diagnosis Date  . Cirrhosis 1992    Cryptogenic  . Esophageal motility disorder 2006    Nonspecific  . GERD (gastroesophageal reflux disease)   . Peripheral neuropathy   . Insomnia   . Dementia   . Hypothyroid   . Anxiety disorder   . Arthritis   . Hypertension   . Depression     Past Surgical History  Procedure Laterality Date  . Appendectomy    . Vesicovaginal fistula closure w/ tah    . Eye surgery    .  Abdominal hysterectomy    . Orif hip fracture  04/23/2011    Procedure: OPEN REDUCTION INTERNAL FIXATION HIP;  Surgeon: Darreld Mclean;  Location: [facility];  Service: Orthopedics;  Laterality: Right;  . Femur im nail Right 01/17/2013    Procedure: INTRAMEDULLARY (IM) RETROGRADE FEMORAL NAILING;  Surgeon: Budd Palmer, MD;  Location: [facility] OR;  Service: Orthopedics;  Laterality: Right;  . Hardware removal Right 01/17/2013    Procedure: HARDWARE REMOVAL OF RIGHT DHS;  Surgeon: Budd Palmer, MD;  Location: [facility] OR;  Service: Orthopedics;  Laterality: Right;  . Hip arthroplasty Left 01/17/2013    Procedure: ARTHROPLASTY BIPOLAR HIP;  Surgeon: Budd Palmer, MD;  Location: [facility] OR;  Service: Orthopedics;  Laterality: Left;  . Esophagogastroduodenoscopy  11/08/2004    NUR:No evidence of esophageal and or gastric varices/Mild antral gastritis, single erosion and small gastric arteriovenous malformation  . Esophagogastroduodenoscopy  07/02/01    NUR::  Normal esophagogastroduodenoscopy  . Esophagogastroduodenoscopy  February 03, 2012    Dr. Alycia Rossetti at [facility]: mild stricture at GE junction but not consistent with achalasia, 2 food boluses in esophagus advanced into the stomach  . Esophagogastroduodenoscopy  Feb 10, 2012    Dr. Lanell Matar at [facility]: esophagitis lower third esophagus, hypertonic LES, angioectesia on greater curve of stomach, non-erosive gastritis, hiatal hernia. Recommended manometry    Current Outpatient Prescriptions  Medication Sig Dispense Refill  . ascorbic acid (VITAMIN C) 500 MG tablet Take 500 mg by mouth daily.      Marland Kitchen  chlorhexidine (PERIDEX) 0.12 % solution Use as directed 15 mLs in the mouth or throat 2 (two) times daily.  120 mL  0  . donepezil (ARICEPT) 10 MG tablet Take 10 mg by mouth at bedtime.        . fentaNYL (DURAGESIC - DOSED MCG/HR) 25 MCG/HR patch Place 1 patch onto the skin every 3 (three) days.      . furosemide (LASIX) 20 MG tablet Take 1 tablet (20 mg total) by mouth every  other day. Takes at lunchtime  30 tablet    . insulin aspart (NOVOLOG) 100 UNIT/ML injection Inject 0-9 Units into the skin 3 (three) times daily with meals. 70-120=0units, 121-150=1units, 151-200=2units, 201-250=3units, 251-300=5units, 301-350=7units, 351-400=9units >401=Call MD      . levothyroxine (SYNTHROID, LEVOTHROID) 75 MCG tablet Take 75 mcg by mouth daily.       . Multiple Vitamin (MULTIVITAMIN WITH MINERALS) TABS Take 1 tablet by mouth daily.      Marland Kitchen omeprazole (PRILOSEC) 20 MG capsule Take 20 mg by mouth daily before supper.       Marland Kitchen oxycodone (OXY-IR) 5 MG capsule Take 1 capsule (5 mg total) by mouth every 4 (four) hours as needed for pain.  20 capsule  0  . polyethylene glycol (MIRALAX / GLYCOLAX) packet Take 17 g by mouth daily.      . potassium chloride (KLOR-CON) 10 MEQ CR tablet Take 10 mEq by mouth 2 (two) times daily.       . pregabalin (LYRICA) 50 MG capsule Take 50 mg by mouth 3 (three) times daily.      . promethazine (PHENERGAN) 25 MG tablet Take 0.5 tablets (12.5 mg total) by mouth every 6 (six) hours as needed for nausea.  30 tablet  0  . calcium-vitamin D (OSCAL WITH D) 500-200 MG-UNIT per tablet Take 1 tablet by mouth 2 (two) times daily.      . Nutritional Supplements (PROMOD) LIQD Take 30 mLs by mouth 2 (two) times daily.      . rifaximin (XIFAXAN) 550 MG TABS tablet Take 550 mg by mouth 2 (two) times daily.       No current facility-administered medications for this visit.    Allergies as of 03/29/2013 - Review Complete 01/17/2013  Allergen Reaction Noted  . Aspirin  04/18/2011  . Codeine    . Morphine and related Other (See Comments) 04/23/2011  . Penicillins  04/18/2011  . Sulfonamide derivatives    . Zolpidem tartrate      Family History  Problem Relation Age of Onset  . Colon cancer Neg Hx     History   Social History  . Marital Status: Widowed    Spouse Name: N/A    Number of Children: N/A  . Years of Education: N/A   Social History Main Topics   . Smoking status: Never Smoker   . Smokeless tobacco: Never Used  . Alcohol Use: No  . Drug Use: No  . Sexual Activity: No   Other Topics Concern  . None   Social History Narrative  . None    Review of Systems: Gen: see HPI CV: Denies chest pain, palpitations, syncope, peripheral edema, and claudication. Resp: Denies dyspnea at rest, cough, wheezing, coughing up blood, and pleurisy. GI: Denies vomiting blood, jaundice, and fecal incontinence.   Denies dysphagia or odynophagia. Derm: Denies rash, itching, dry skin Psych: +confusion, memory loss  Heme: Denies bruising, bleeding, and enlarged lymph nodes.  Physical Exam: BP 97/45  Pulse 73  Temp(Src) 97.4 F (36.3 C) (Oral)  Ht 5' 4.5" (1.638 m) General:   Alert and oriented to person, place, and situation. Hx of dementia, unsure year.  Head:  Normocephalic and atraumatic. Eyes:  Conjuctiva clear without scleral icterus. Mouth:  Oral mucosa pink and moist.  Neck:  Supple, without mass or thyromegaly. Heart:  S1, S2 present without murmurs, rubs, or gallops. Regular rate and rhythm. Abdomen:  +BS, soft, non-tender and non-distended. No rebound or guarding. No HSM or masses noted. Msk:  Kyphosis, in wheelchair. Weak.  Extremities:  Without edema. Neurologic:  Alert and  oriented to person, place, situation but unsure year.  Skin:  Fragile.  Psych:  Alert and cooperative. Normal mood and affect.  OUTSIDE LABS Aug 2014 Tbili 1.3 (baseline) Direct bili 0.4 AP 208 (elevated, not at baseline for patient) AST 37 ALT 14 Albumin 2.8  Hgb 12.9 Hct 39.8 Plts 107   BUN 17 Cr 1.15 Na 138 K 4.2

## 2013-03-29 NOTE — Patient Instructions (Signed)
We have set you up for an upper endoscopy with dilation with Dr. Jena Gauss in the near future.  You also will need an ultrasound of your abdomen due to your history of cirrhosis. We will try to schedule this for the same day.

## 2013-03-30 ENCOUNTER — Encounter: Payer: Self-pay | Admitting: Gastroenterology

## 2013-03-30 ENCOUNTER — Encounter (HOSPITAL_COMMUNITY): Payer: Self-pay | Admitting: Pharmacist

## 2013-03-30 NOTE — Progress Notes (Unsigned)
I need any xrays, swallow studies, actual consult note from Speech Pathology from the facility. I do have progress notes, but it is missing diagnostic work-up.

## 2013-03-31 ENCOUNTER — Other Ambulatory Visit: Payer: Self-pay | Admitting: Gastroenterology

## 2013-03-31 DIAGNOSIS — R1310 Dysphagia, unspecified: Secondary | ICD-10-CM | POA: Insufficient documentation

## 2013-03-31 DIAGNOSIS — K7460 Unspecified cirrhosis of liver: Secondary | ICD-10-CM | POA: Insufficient documentation

## 2013-03-31 DIAGNOSIS — R1319 Other dysphagia: Secondary | ICD-10-CM

## 2013-03-31 LAB — CBC
ALT: 14 U/L (ref 7–35)
AST: 37 U/L
Albumin: 2.8
Alkaline Phosphatase: 208 U/L
Bilirubin, Indirect: 0.9
HGB: 12.9 g/dL
Total Bilirubin: 1.3 mg/dL

## 2013-03-31 LAB — BASIC METABOLIC PANEL
Creat: 1.15
Potassium: 4.2 mmol/L
Sodium: 138 mmol/L (ref 137–147)

## 2013-03-31 NOTE — Assessment & Plan Note (Signed)
Labs as above, at baseline for patient except for elevated AP (moreso than her baseline). Need INR, [place] of abdomen for HCC screening.

## 2013-03-31 NOTE — Assessment & Plan Note (Signed)
79 year old female with long-standing history of dysphagia, underlying motility disorder, and most recent EGDs at [facility] in July and August, presenting with worsening dysphagia and inability to tolerate solid food/occasionally liquids; EGD in July secondary to food impaction, mild stricture at GE junction. August EGD showed esophagitis, gastritis. Manometry recommended but never completed.   Brief review of speech pathology notes reveal patient does not like to follow a soft mechanical diet, which is likely playing a large role here. However, she also notes difficulty with liquids, significant regurgitation. Known motility disorder also contributing. Would recommend an updated EGD/ED, with manometry likely thereafter.   Proceed with upper endoscopy/dilation in the near future with Dr. Darrick Penna. The risks, benefits, and alternatives have been discussed in detail with patient. They have stated understanding and desire to proceed.

## 2013-03-31 NOTE — Progress Notes (Signed)
Requested Records.  

## 2013-03-31 NOTE — Progress Notes (Signed)
CC'd to PCP 

## 2013-04-14 ENCOUNTER — Other Ambulatory Visit (HOSPITAL_COMMUNITY): Payer: PRIVATE HEALTH INSURANCE

## 2013-04-14 ENCOUNTER — Ambulatory Visit (HOSPITAL_COMMUNITY)
Admission: RE | Admit: 2013-04-14 | Payer: PRIVATE HEALTH INSURANCE | Source: Ambulatory Visit | Admitting: Internal Medicine

## 2013-04-14 ENCOUNTER — Encounter (HOSPITAL_COMMUNITY): Admission: RE | Payer: Self-pay | Source: Ambulatory Visit

## 2013-04-14 SURGERY — ESOPHAGOGASTRODUODENOSCOPY (EGD) WITH ESOPHAGEAL DILATION
Anesthesia: Moderate Sedation

## 2013-04-19 ENCOUNTER — Ambulatory Visit (HOSPITAL_COMMUNITY)
Admission: RE | Admit: 2013-04-19 | Discharge: 2013-04-19 | Disposition: A | Discharge status: Home / Self Care | Payer: PRIVATE HEALTH INSURANCE | Source: Ambulatory Visit | Attending: Gastroenterology | Admitting: Gastroenterology

## 2013-04-19 ENCOUNTER — Encounter (HOSPITAL_COMMUNITY)
Admission: RE | Disposition: A | Discharge status: Home / Self Care | Payer: Self-pay | Source: Ambulatory Visit | Attending: Gastroenterology

## 2013-04-19 ENCOUNTER — Encounter (HOSPITAL_COMMUNITY): Payer: Self-pay | Admitting: *Deleted

## 2013-04-19 DIAGNOSIS — R1319 Other dysphagia: Secondary | ICD-10-CM

## 2013-04-19 DIAGNOSIS — I10 Essential (primary) hypertension: Secondary | ICD-10-CM | POA: Insufficient documentation

## 2013-04-19 DIAGNOSIS — K2960 Other gastritis without bleeding: Secondary | ICD-10-CM | POA: Insufficient documentation

## 2013-04-19 DIAGNOSIS — R1310 Dysphagia, unspecified: Secondary | ICD-10-CM

## 2013-04-19 DIAGNOSIS — K222 Esophageal obstruction: Secondary | ICD-10-CM

## 2013-04-19 DIAGNOSIS — K7460 Unspecified cirrhosis of liver: Secondary | ICD-10-CM

## 2013-04-19 DIAGNOSIS — E119 Type 2 diabetes mellitus without complications: Secondary | ICD-10-CM | POA: Insufficient documentation

## 2013-04-19 DIAGNOSIS — Z794 Long term (current) use of insulin: Secondary | ICD-10-CM | POA: Insufficient documentation

## 2013-04-19 HISTORY — PX: ESOPHAGOGASTRODUODENOSCOPY (EGD) WITH ESOPHAGEAL DILATION: SHX5812

## 2013-04-19 LAB — GLUCOSE, CAPILLARY: Glucose-Capillary: 166 mg/dL — ABNORMAL HIGH (ref 70–99)

## 2013-04-19 SURGERY — ESOPHAGOGASTRODUODENOSCOPY (EGD) WITH ESOPHAGEAL DILATION
Anesthesia: Moderate Sedation

## 2013-04-19 MED ORDER — MEPERIDINE HCL 100 MG/ML IJ SOLN
INTRAMUSCULAR | Status: DC | PRN
  Administered 2013-04-19: 25 mg via INTRAVENOUS

## 2013-04-19 MED ORDER — SODIUM CHLORIDE 0.9 % IV SOLN
INTRAVENOUS | Status: DC
  Administered 2013-04-19: 10:00:00 via INTRAVENOUS

## 2013-04-19 MED ORDER — MEPERIDINE HCL 100 MG/ML IJ SOLN
INTRAMUSCULAR | Status: AC
  Filled 2013-04-19: qty 2

## 2013-04-19 MED ORDER — DEXTROSE IN LACTATED RINGERS 5 % IV SOLN
Freq: Once | INTRAVENOUS | Status: AC
  Administered 2013-04-19: 10:00:00 via INTRAVENOUS

## 2013-04-19 MED ORDER — ESOMEPRAZOLE MAGNESIUM 40 MG PO CPDR: 40.0000 mg | DELAYED_RELEASE_CAPSULE | Freq: Every day | ORAL | Status: DC

## 2013-04-19 MED ORDER — STERILE WATER FOR IRRIGATION IR SOLN
Status: DC | PRN
  Administered 2013-04-19: 11:00:00

## 2013-04-19 MED ORDER — BUTAMBEN-TETRACAINE-BENZOCAINE 2-2-14 % EX AERO
INHALATION_SPRAY | CUTANEOUS | Status: DC | PRN
  Administered 2013-04-19: 2 via TOPICAL

## 2013-04-19 MED ORDER — MIDAZOLAM HCL 5 MG/5ML IJ SOLN
INTRAMUSCULAR | Status: AC
  Filled 2013-04-19: qty 10

## 2013-04-19 MED ORDER — MINERAL OIL PO OIL
TOPICAL_OIL | ORAL | Status: AC
  Filled 2013-04-19: qty 30

## 2013-04-19 MED ORDER — MIDAZOLAM HCL 5 MG/5ML IJ SOLN
INTRAMUSCULAR | Status: DC | PRN
  Administered 2013-04-19 (×3): 1 mg via INTRAVENOUS

## 2013-04-19 NOTE — Progress Notes (Addendum)
REVIEWED.PLT CT 107K. CONSIDER RISK V. BENEFITS OF ESOPHAGEAL DILATION SPEECH PATH NOTES REVIEWED. 2010 BPE-Dominant finding is of significant diffuse impairment of esophageal  motility. Mid thoracic esophageal diverticulum.

## 2013-04-19 NOTE — Progress Notes (Signed)
Awakens easily to name. Answers questions appropriately. Sips of ginger-ale given. Tolerated well.

## 2013-04-19 NOTE — Progress Notes (Signed)
Report called to [place] at [facility] in [place]. Voiced understanding. Copy of d/c instructions sent with pt to [facility].

## 2013-04-19 NOTE — Op Note (Addendum)
[facility] [address] [place], [address]   ENDOSCOPY PROCEDURE REPORT  PATIENT: [name], [name]  MR#: [id number] BIRTHDATE: 19-Sep-1922 , 89  yrs. old GENDER: Female  ENDOSCOPIST: Jonette Eva, MD REFFERED AO:ZHYQ Hasanaj, M.D.  PROCEDURE DATE:  04/19/2013 PROCEDURE:   EGD with biopsy and EGD with dilatation over guidewire   INDICATIONS:1.  dysphagia. PMHx; cirrhosis, PLT CT 107K. BPE 2010: SEVERE ESO MOTILITY DISORDER. EGD [facility] JUL/AUG 2013: NO DILATION x2. PT ON COUMADIN. MEDICATIONS: Demerol 25 mg IV and Versed 3 mg IV TOPICAL ANESTHETIC: Cetacaine Spray  DESCRIPTION OF PROCEDURE:   After the risks benefits and alternatives of the procedure were thoroughly explained, informed consent was obtained.  The EG-[id number] ([id number])  endoscope was introduced through the mouth and advanced to the second portion of the duodenum. The instrument was slowly withdrawn as the mucosa was carefully examined.  Prior to withdrawal of the scope, the guidwire was placed.  The esophagus was dilated successfully.  The patient was recovered in endoscopy and discharged home in satisfactory condition.   ESOPHAGUS: A stricture was found at the gastroesophageal junction. The stenosis was traversable with the endoscope.   STOMACH: Moderate erosive gastritis (inflammation) was found in the gastric antrum and gastric body.  Multiple biopsies were performed using cold forceps.   DUODENUM: The duodenal mucosa showed no abnormalities in the bulb and second portion of the duodenum. Dilation was then performed at the gastroesphageal junction Dilator: Savary over guidewire Size(s): 12.8-17 MM Resistance: minimal Heme: yes: SLIGHT  COMPLICATIONS: There were no complications.   ENDOSCOPIC IMPRESSION: 1.   Stricture at the gastroesophageal junction 2.   MODERTAE Erosive gastritis  RECOMMENDATIONS: FOLLOW DIET PER SPEECH PATHOLOGY EVALUATION. CONTINUE NEXIUM FOREVER. BIOPSY WILL BE  BACK IN 7 DAYS  FOLLOW UP IN 3 MOS.      _______________________________ Rosalie DoctorJonette Eva, MD 04/19/2013 3:51 PM      PATIENT NAME:  [name], [name] MR#: [id number]

## 2013-04-19 NOTE — H&P (Signed)
Primary Care Physician:  Toma Deiters, MD Primary Gastroenterologist:  Dr. Darrick Penna  Pre-Procedure History & Physical: HPI:  [name] is a 79 y.o. female here for dysphagia.  Past Medical History  Diagnosis Date  . Cirrhosis 1992    Cryptogenic  . Esophageal motility disorder 2006    Nonspecific  . GERD (gastroesophageal reflux disease)   . Peripheral neuropathy   . Insomnia   . Dementia   . Hypothyroid   . Anxiety disorder   . Arthritis   . Hypertension   . Depression     Past Surgical History  Procedure Laterality Date  . Appendectomy    . Vesicovaginal fistula closure w/ tah    . Eye surgery    . Abdominal hysterectomy    . Orif hip fracture  04/23/2011    Procedure: OPEN REDUCTION INTERNAL FIXATION HIP;  Surgeon: Darreld Mclean;  Location: [facility];  Service: Orthopedics;  Laterality: Right;  . Femur im nail Right 01/17/2013    Procedure: INTRAMEDULLARY (IM) RETROGRADE FEMORAL NAILING;  Surgeon: Budd Palmer, MD;  Location: [facility] OR;  Service: Orthopedics;  Laterality: Right;  . Hardware removal Right 01/17/2013    Procedure: HARDWARE REMOVAL OF RIGHT DHS;  Surgeon: Budd Palmer, MD;  Location: [facility] OR;  Service: Orthopedics;  Laterality: Right;  . Hip arthroplasty Left 01/17/2013    Procedure: ARTHROPLASTY BIPOLAR HIP;  Surgeon: Budd Palmer, MD;  Location: [facility] OR;  Service: Orthopedics;  Laterality: Left;  . Esophagogastroduodenoscopy  11/08/2004    NUR:No evidence of esophageal and or gastric varices/Mild antral gastritis, single erosion and small gastric arteriovenous malformation  . Esophagogastroduodenoscopy  07/02/01    NUR::  Normal esophagogastroduodenoscopy  . Esophagogastroduodenoscopy  February 03, 2012    Dr. Alycia Rossetti at [facility]: mild stricture at GE junction but not consistent with achalasia, 2 food boluses in esophagus advanced into the stomach  . Esophagogastroduodenoscopy  Feb 10, 2012    Dr. Lanell Matar at [facility]: esophagitis lower third esophagus,  hypertonic LES, angioectesia on greater curve of stomach, non-erosive gastritis, hiatal hernia. Recommended manometry    Prior to Admission medications   Medication Sig Start Date End Date Taking? Authorizing Provider  ascorbic acid (VITAMIN C) 500 MG tablet Take 500 mg by mouth daily.   Yes Historical Provider, MD  calcium-vitamin D (OSCAL WITH D) 500-200 MG-UNIT per tablet Take 1 tablet by mouth 2 (two) times daily.   Yes Historical Provider, MD  chlorhexidine (PERIDEX) 0.12 % solution Use as directed 15 mLs in the mouth or throat 2 (two) times daily. 01/31/13  Yes Christiane Ha, MD  donepezil (ARICEPT) 10 MG tablet Take 10 mg by mouth at bedtime.     Yes Historical Provider, MD  esomeprazole (NEXIUM) 20 MG capsule Take 20 mg by mouth daily before breakfast.   Yes Historical Provider, MD  fentaNYL (DURAGESIC - DOSED MCG/HR) 25 MCG/HR patch Place 1 patch onto the skin every 3 (three) days.   Yes Historical Provider, MD  furosemide (LASIX) 20 MG tablet Take 1 tablet (20 mg total) by mouth every other day. Takes at lunchtime 01/31/13  Yes Christiane Ha, MD  insulin aspart (NOVOLOG) 100 UNIT/ML injection Inject 0-9 Units into the skin 3 (three) times daily with meals. 70-120=0units, 121-150=1units, 151-200=2units, 201-250=3units, 251-300=5units, 301-350=7units, 351-400=9units >401=Call MD   Yes Historical Provider, MD  levothyroxine (SYNTHROID, LEVOTHROID) 75 MCG tablet Take 75 mcg by mouth daily.    Yes Historical Provider, MD  Multiple Vitamin (MULTIVITAMIN WITH MINERALS)  TABS Take 1 tablet by mouth daily.   Yes Historical Provider, MD  Nutritional Supplements (PROMOD) LIQD Take 30 mLs by mouth 2 (two) times daily.   Yes Historical Provider, MD  oxycodone (OXY-IR) 5 MG capsule Take 1 capsule (5 mg total) by mouth every 4 (four) hours as needed for pain. 01/31/13  Yes Christiane Ha, MD  polyethylene glycol (MIRALAX / GLYCOLAX) packet Take 17 g by mouth daily.   Yes Historical Provider, MD   potassium chloride (KLOR-CON) 10 MEQ CR tablet Take 10 mEq by mouth 2 (two) times daily.    Yes Historical Provider, MD  pregabalin (LYRICA) 50 MG capsule Take 50 mg by mouth 3 (three) times daily.   Yes Historical Provider, MD  promethazine (PHENERGAN) 25 MG tablet Take 0.5 tablets (12.5 mg total) by mouth every 6 (six) hours as needed for nausea. 01/31/13  Yes Christiane Ha, MD  rifaximin (XIFAXAN) 550 MG TABS tablet Take 1,100 mg by mouth daily.    Yes Historical Provider, MD    Allergies as of 03/31/2013 - Review Complete 03/30/2013  Allergen Reaction Noted  . Aspirin  04/18/2011  . Codeine    . Morphine and related Other (See Comments) 04/23/2011  . Penicillins Other (See Comments) 04/18/2011  . Sulfonamide derivatives    . Zolpidem tartrate      Family History  Problem Relation Age of Onset  . Colon cancer Neg Hx     History   Social History  . Marital Status: Widowed    Spouse Name: N/A    Number of Children: N/A  . Years of Education: N/A   Occupational History  . Not on file.   Social History Main Topics  . Smoking status: Never Smoker   . Smokeless tobacco: Never Used  . Alcohol Use: No  . Drug Use: No  . Sexual Activity: No   Other Topics Concern  . Not on file   Social History Narrative  . No narrative on file    Review of Systems: See HPI, otherwise negative ROS   Physical Exam: BP 107/37  Pulse 77  Temp(Src) 97.9 F (36.6 C) (Oral)  Resp 18  SpO2 92% General:   Alert,  pleasant and cooperative in NAD Head:  Normocephalic and atraumatic. Neck:  Supple; Lungs:  Clear throughout to auscultation.    Heart:  Regular rate and rhythm. Abdomen:  Soft, nontender and nondistended. Normal bowel sounds, without guarding, and without rebound.   Neurologic:  Alert and  oriented x4;  grossly normal neurologically.  Impression/Plan:     DYSPHAGIA  PLAN:  EGD/?DIL TODAY

## 2013-04-21 ENCOUNTER — Encounter (HOSPITAL_COMMUNITY): Payer: Self-pay | Admitting: Gastroenterology

## 2013-04-21 ENCOUNTER — Telehealth: Payer: Self-pay | Admitting: Gastroenterology

## 2013-04-21 NOTE — Telephone Encounter (Signed)
Please call pt. HER stomach Bx shows gastritis.  FOLLOW DIET PER SPEECH PATHOLOGY EVALUATION. CONTINUE NEXIUM FOREVER. FOLLOW UP IN 3 MOS E30 SLF DYSPHAGIA.Marland Kitchen

## 2013-04-21 NOTE — Telephone Encounter (Signed)
Called # listed. Was told that it is her transporter, to call [facility] at [phone] and ask to speak to Collie Siad, her nurse.

## 2013-04-22 NOTE — Telephone Encounter (Signed)
Called and informed day nurse, Levester Fresh.

## 2013-04-26 NOTE — Telephone Encounter (Signed)
Reminder in epic °

## 2013-04-27 NOTE — Progress Notes (Signed)
Quick Note:  No HCC. Repeat in 6 months. ______ 

## 2013-04-27 NOTE — Progress Notes (Signed)
Called and informed Pansy, nurse. Faxed a copy of the [place] to [phone].

## 2013-04-28 NOTE — Progress Notes (Signed)
Quick Note:  LATE ENTRY: I called and informed the pt's nurse, Pansy, at the [facility] in [place]. I also faxed her a copy of the report. ______

## 2013-06-14 NOTE — Progress Notes (Signed)
Clinical social worker assisted with patient discharge to skilled nursing facility, [facility].  CSW addressed all family questions and concerns. CSW copied chart and added all important documents. CSW also set up patient transportation with [facility]. Clinical Social Worker will sign off for now as social work intervention is no longer needed.   Briget Shaheed, MSW, LCSWA [phone] 

## 2013-07-28 ENCOUNTER — Encounter: Payer: Self-pay | Admitting: Gastroenterology

## 2013-07-28 ENCOUNTER — Ambulatory Visit (INDEPENDENT_AMBULATORY_CARE_PROVIDER_SITE_OTHER): Payer: Medicare Other | Admitting: Gastroenterology

## 2013-07-28 VITALS — BP 109/54 | HR 94 | Temp 97.6°F

## 2013-07-28 DIAGNOSIS — R1310 Dysphagia, unspecified: Secondary | ICD-10-CM

## 2013-07-28 DIAGNOSIS — K7460 Unspecified cirrhosis of liver: Secondary | ICD-10-CM

## 2013-07-28 NOTE — Progress Notes (Signed)
Additional labs printed off for pt after her ov today with Laban Emperor, NP. I called [facility] and spoke to her nurse, Marshell Garfinkel, and told her that I am faxing all of the lab orders, just to make sure that none were missed. She said to fax it to her at [phone]. Faxed.

## 2013-07-28 NOTE — Addendum Note (Signed)
Addended by: Orvil Feil on: 07/28/2013 02:21 PM   Modules accepted: Orders

## 2013-07-28 NOTE — Progress Notes (Signed)
Referring Provider: Neale Burly, MD Primary Care Physician:  Neale Burly, MD Primary GI: Dr Oneida Alar   Chief Complaint  Patient presents with  . Follow-up    hurting    HPI:   [name] presents today in follow-up after EGD with dilation of stricture at GE junction. Moderate gastritis noted, negative H.pylori. Pertinent history includes cirrhosis, GERD, gastroparesis. [place] of [place] state. [place] of abdomen up to date Oct 2014 with cirrhosis, no HCC. Distended gallbladder but no stones.  Betsey Amen, guardian present. States patient able to drink [facility] without problem. Still spitting up phlegm. Sees food and makes her "sick". Sometimes feels nauseated when looking at food. Known history of esophageal dysmotility. Refused puree diet. Now on mechanical soft diet. Legs and hands are cramping. No abdominal pain. Caretaker believes color is different. More confusion than normal. Unsure year or place. Confused regarding jewelry she is wearing. Unclear how many bowel movements per day patient is having.    Past Medical History  Diagnosis Date  . Cirrhosis 1992    Cryptogenic  . Esophageal motility disorder 2006    Nonspecific  . GERD (gastroesophageal reflux disease)   . Peripheral neuropathy   . Insomnia   . Dementia   . Hypothyroid   . Anxiety disorder   . Arthritis   . Hypertension   . Depression     Past Surgical History  Procedure Laterality Date  . Appendectomy    . Vesicovaginal fistula closure w/ tah    . Eye surgery    . Abdominal hysterectomy    . Orif hip fracture  04/23/2011    Procedure: OPEN REDUCTION INTERNAL FIXATION HIP;  Surgeon: Sanjuana Kava;  Location: [facility];  Service: Orthopedics;  Laterality: Right;  . Femur im nail Right 01/17/2013    Procedure: INTRAMEDULLARY (IM) RETROGRADE FEMORAL NAILING;  Surgeon: Rozanna Box, MD;  Location: [place];  Service: Orthopedics;  Laterality: Right;  . Hardware removal Right 01/17/2013    Procedure:  HARDWARE REMOVAL OF RIGHT DHS;  Surgeon: Rozanna Box, MD;  Location: [place];  Service: Orthopedics;  Laterality: Right;  . Hip arthroplasty Left 01/17/2013    Procedure: ARTHROPLASTY BIPOLAR HIP;  Surgeon: Rozanna Box, MD;  Location: [place];  Service: Orthopedics;  Laterality: Left;  . Esophagogastroduodenoscopy  11/08/2004    NUR:No evidence of esophageal and or gastric varices/Mild antral gastritis, single erosion and small gastric arteriovenous malformation  . Esophagogastroduodenoscopy  07/02/01    NUR::  Normal esophagogastroduodenoscopy  . Esophagogastroduodenoscopy  February 03, 2012    Dr. Derrill Kay at [facility]: mild stricture at [place] junction but not consistent with achalasia, 2 food boluses in esophagus advanced into the stomach  . Esophagogastroduodenoscopy  Feb 10, 2012    Dr. Jerene Pitch at [facility]: esophagitis lower third esophagus, hypertonic LES, angioectesia on greater curve of stomach, non-erosive gastritis, hiatal hernia. Recommended manometry  . Esophagogastroduodenoscopy (egd) with esophageal dilation N/A 04/19/2013    Dr. Fields:Stricture at the gastroesophageal junction s/p Savary dilation. moderate erosive esophagitis     Current Outpatient Prescriptions  Medication Sig Dispense Refill  . ascorbic acid (VITAMIN C) 500 MG tablet Take 500 mg by mouth daily.      . calcium-vitamin D (OSCAL WITH D) 500-200 MG-UNIT per tablet Take 1 tablet by mouth 2 (two) times daily.      . chlorhexidine (PERIDEX) 0.12 % solution Use as directed 15 mLs in the mouth or throat 2 (two) times daily.  [place]  mL  0  . donepezil (ARICEPT) 10 MG tablet Take 10 mg by mouth at bedtime.        . fentaNYL (DURAGESIC - DOSED MCG/HR) 25 MCG/HR patch Place 1 patch onto the skin every 3 (three) days.      . furosemide (LASIX) 20 MG tablet Take 1 tablet (20 mg total) by mouth every other day. Takes at lunchtime  30 tablet    . insulin aspart (NOVOLOG) 100 UNIT/ML injection Inject 0-9 Units into the skin 3 (three) times  daily with meals. 70-120=0units, 121-150=1units, 151-200=2units, 201-250=3units, 251-300=5units, 301-350=7units, 351-400=9units >401=Call MD      . levothyroxine (SYNTHROID, LEVOTHROID) 75 MCG tablet Take 75 mcg by mouth daily.       . Multiple Vitamin (MULTIVITAMIN WITH MINERALS) TABS Take 1 tablet by mouth daily.      . Nutritional Supplements (PROMOD) LIQD Take 30 mLs by mouth 2 (two) times daily.      Marland Kitchen omeprazole (PRILOSEC) 20 MG capsule Take 20 mg by mouth daily.      Marland Kitchen oxycodone (OXY-IR) 5 MG capsule Take 1 capsule (5 mg total) by mouth every 4 (four) hours as needed for pain.  20 capsule  0  . polyethylene glycol (MIRALAX / GLYCOLAX) packet Take 17 g by mouth daily.      . potassium chloride (KLOR-CON) 10 MEQ CR tablet Take 10 mEq by mouth 2 (two) times daily.       . pregabalin (LYRICA) 50 MG capsule Take 50 mg by mouth 3 (three) times daily.      . promethazine (PHENERGAN) 25 MG tablet Take 0.5 tablets (12.5 mg total) by mouth every 6 (six) hours as needed for nausea.  30 tablet  0  . rifaximin (XIFAXAN) 550 MG TABS tablet Take 1,100 mg by mouth daily.        No current facility-administered medications for this visit.    Allergies as of 07/28/2013 - Review Complete 04/19/2013  Allergen Reaction Noted  . Aspirin Hives 04/18/2011  . Codeine Other (See Comments)   . Morphine and related Other (See Comments) 04/23/2011  . Penicillins Other (See Comments) 04/18/2011  . Sulfonamide derivatives Other (See Comments)   . Zolpidem tartrate Other (See Comments)     Family History  Problem Relation Age of Onset  . Colon cancer Neg Hx     History   Social History  . Marital Status: Widowed    Spouse Name: N/A    Number of Children: N/A  . Years of Education: N/A   Social History Main Topics  . Smoking status: Never Smoker   . Smokeless tobacco: Never Used  . Alcohol Use: No  . Drug Use: No  . Sexual Activity: No   Other Topics Concern  . None   Social History Narrative    . None    Review of Systems: Unable to obtain.   Physical Exam: BP 109/54  Pulse 94  Temp(Src) 97.6 F (36.4 C) (Oral) General:   Alert and oriented to person. Confused regardign year, place, situation. Appears mildly jaundiced. Head:  Normocephalic and atraumatic. Eyes:  Conjuctiva clear with mild scleral icterus Heart:  S1, S2 present without murmurs, rubs, or gallops.  Abdomen:  Limited exam, patient in wheelchair.+BS, soft, non-tender and non-distended.  Msk:  kyphosis Extremities:  Without edema. Neurologic:  Pleasantly confused.

## 2013-07-28 NOTE — Assessment & Plan Note (Signed)
Improved since dilation. Known motility disorder. Patient "spitting up" a lot, refusing prescribed diet. Need repeat speech evaluation, recommendations of diet per speech. Return in 6 weeks.

## 2013-07-28 NOTE — Assessment & Plan Note (Signed)
[place] of abdomen up-to-date as of Oct 2014 without signs of [place]. Appears more confused than baseline, mild icterus on exam. Question of jaundice. Concern for decompensation. Check INR, HFP, ammonia level, BMP, and CBC now. Will contact facility to inquire about number of bowel movements daily. May need to add lactulose. Further recommendations to follow.

## 2013-08-01 NOTE — Progress Notes (Signed)
cc'd to pcp 

## 2013-08-03 MED ORDER — LACTULOSE 10 GM/15ML PO SOLN: 20.0000 g | Freq: Two times a day (BID) | ORAL | Status: AC

## 2013-08-03 NOTE — Addendum Note (Signed)
Addended by: Orvil Feil on: 08/03/2013 02:20 PM   Modules accepted: Orders

## 2013-08-03 NOTE — Progress Notes (Signed)
Received labs from [facility] in [place]. MELD 17  Hgb 12.6, Hct 36, Plts 55.   INR 1.59  BUN 20, Cr 1.06.   Tbili 3.8 Direct 0.8 Indirect 3.0 AP 134 AST 45 ALT 12 Albumin 2.1  Ammonia 71.   Last LFTs on file from July 2014. Overall similar except for Tbili, which was 1.6 at that time.   I would like to get an updated [place] of abdomen now. Need to start on Lactulose 30 ml po BID. Hold if diarrhea.

## 2013-08-04 ENCOUNTER — Other Ambulatory Visit: Payer: Self-pay | Admitting: Gastroenterology

## 2013-08-04 DIAGNOSIS — R945 Abnormal results of liver function studies: Secondary | ICD-10-CM

## 2013-08-04 DIAGNOSIS — R7989 Other specified abnormal findings of blood chemistry: Secondary | ICD-10-CM

## 2013-08-04 LAB — CBC
ALT: 12 U/L (ref 7–35)
AMMONIA: 71
AST: 45 U/L
Albumin: 2.1
Alkaline Phosphatase: 134 U/L
BILIRUBIN DIRECT: 0.8 mg/dL — AB (ref 0.01–0.4)
BILIRUBIN INDIRECT: 3
BILIRUBIN TOTAL: 3.8 mg/dL
BUN: 20 mg/dL (ref 4–21)
CREATININE: 1.06
HCT: 36 %
HEMOGLOBIN: 12.6 g/dL
INR: 1.6
PT: 18.7
Total Protein: 5.8 g/dL
platelet count: 55

## 2013-08-04 NOTE — Progress Notes (Signed)
I called the [facility] and spoke to [place]. Pt's nurse, Elane Fritz, is busy and Janace Hoard will have her return the call.

## 2013-08-04 NOTE — Progress Notes (Signed)
I spoke to Lanney Gins from the [facility] Bayard Males and she stated that if we fax an order for an Abdominal Ultrasound over to the [facility] fax# [phone]) then they will do it there and fax the results back to [place] if we send them a cover sheet with our return fax #

## 2013-08-04 NOTE — Progress Notes (Signed)
T/C from [place] at [facility]. Informed her and she needed verbal for the prescription ( they use [facility]) . I gave her the order and told her to hold if diarrhea. She said to call Lanney Gins to schedule the [place]. Routing to Darius Bump to schedule.

## 2013-08-05 ENCOUNTER — Other Ambulatory Visit: Payer: Self-pay | Admitting: Gastroenterology

## 2013-08-05 DIAGNOSIS — R945 Abnormal results of liver function studies: Secondary | ICD-10-CM

## 2013-08-05 DIAGNOSIS — R7989 Other specified abnormal findings of blood chemistry: Secondary | ICD-10-CM

## 2013-08-05 NOTE — Progress Notes (Signed)
Order has been faxed to [facility]

## 2013-08-24 NOTE — Progress Notes (Signed)
Forwarding to Anna Sams, NP.  

## 2013-08-24 NOTE — Progress Notes (Addendum)
[place] of abdomen reviewed. No liver masses or dilated bile ducts. Gallbladder normal. Mild splenomegaly. No acute findings.   Will recheck LFTs at next appt. How many bowel movements does patient have daily?

## 2013-08-24 NOTE — Progress Notes (Signed)
[facility] and informed pt's nurse, Jeani Hawking. She said pt is having some good BM's. Three good ones today. Not diarrhea today.

## 2013-09-05 ENCOUNTER — Encounter (INDEPENDENT_AMBULATORY_CARE_PROVIDER_SITE_OTHER): Payer: Self-pay

## 2013-09-05 ENCOUNTER — Encounter: Payer: Self-pay | Admitting: Gastroenterology

## 2013-09-05 ENCOUNTER — Ambulatory Visit (INDEPENDENT_AMBULATORY_CARE_PROVIDER_SITE_OTHER): Payer: Medicare Other | Admitting: Gastroenterology

## 2013-09-05 VITALS — BP 115/50 | HR 95 | Temp 97.8°F | Wt 119.0 lb

## 2013-09-05 DIAGNOSIS — K7460 Unspecified cirrhosis of liver: Secondary | ICD-10-CM

## 2013-09-05 NOTE — Progress Notes (Signed)
Referring Provider: Neale Burly, MD Primary Care Physician:  Neale Burly, MD Primary GI: Dr. Oneida Alar   Chief Complaint  Patient presents with  . Follow-up    HPI:   [name] presents today in follow-up with a history of cirrhosis, GERD, gastroparesis. Seen Jul 28, 2013. Updated [place] of abdomen without evidence of [place]. LFTs appear to be stable overall from July 2014, when last drawn, with exception of Tbili which is mildly elevated at 3.8, MELD 17.  Not much of an appetite. Sinus drainage. Drinks a lot of [employer]. Feels nauseated all the time. No abdominal pain. Unsure year, thinks it is "3000". [place]. Knows DOB. Knows President. Poor historian.   Continued weight loss. Weight today 119. Driver present with patient.    Past Medical History  Diagnosis Date  . Cirrhosis 1992    Cryptogenic  . Esophageal motility disorder 2006    Nonspecific  . GERD (gastroesophageal reflux disease)   . Peripheral neuropathy   . Insomnia   . Dementia   . Hypothyroid   . Anxiety disorder   . Arthritis   . Hypertension   . Depression     Past Surgical History  Procedure Laterality Date  . Appendectomy    . Vesicovaginal fistula closure w/ tah    . Eye surgery    . Abdominal hysterectomy    . Orif hip fracture  04/23/2011    Procedure: OPEN REDUCTION INTERNAL FIXATION HIP;  Surgeon: Sanjuana Kava;  Location: [facility];  Service: Orthopedics;  Laterality: Right;  . Femur im nail Right 01/17/2013    Procedure: INTRAMEDULLARY (IM) RETROGRADE FEMORAL NAILING;  Surgeon: Rozanna Box, MD;  Location: [place];  Service: Orthopedics;  Laterality: Right;  . Hardware removal Right 01/17/2013    Procedure: HARDWARE REMOVAL OF RIGHT DHS;  Surgeon: Rozanna Box, MD;  Location: [place];  Service: Orthopedics;  Laterality: Right;  . Hip arthroplasty Left 01/17/2013    Procedure: ARTHROPLASTY BIPOLAR HIP;  Surgeon: Rozanna Box, MD;  Location: [place];  Service: Orthopedics;  Laterality:  Left;  . Esophagogastroduodenoscopy  11/08/2004    NUR:No evidence of esophageal and or gastric varices/Mild antral gastritis, single erosion and small gastric arteriovenous malformation  . Esophagogastroduodenoscopy  07/02/01    NUR::  Normal esophagogastroduodenoscopy  . Esophagogastroduodenoscopy  February 03, 2012    Dr. Derrill Kay at [facility]: mild stricture at [place] junction but not consistent with achalasia, 2 food boluses in esophagus advanced into the stomach  . Esophagogastroduodenoscopy  Feb 10, 2012    Dr. Jerene Pitch at [facility]: esophagitis lower third esophagus, hypertonic LES, angioectesia on greater curve of stomach, non-erosive gastritis, hiatal hernia. Recommended manometry  . Esophagogastroduodenoscopy (egd) with esophageal dilation N/A 04/19/2013    Dr. Fields:Stricture at the gastroesophageal junction s/p Savary dilation. moderate erosive esophagitis     Current Outpatient Prescriptions  Medication Sig Dispense Refill  . ascorbic acid (VITAMIN C) 500 MG tablet Take 500 mg by mouth daily.      . calcium-vitamin D (OSCAL WITH D) 500-200 MG-UNIT per tablet Take 1 tablet by mouth 2 (two) times daily.      . chlorhexidine (PERIDEX) 0.12 % solution Use as directed 15 mLs in the mouth or throat 2 (two) times daily.  120 mL  0  . donepezil (ARICEPT) 10 MG tablet Take 10 mg by mouth at bedtime.        . fentaNYL (DURAGESIC - DOSED MCG/HR) 25 MCG/HR patch Place 1  patch onto the skin every 3 (three) days.      . furosemide (LASIX) 20 MG tablet Take 1 tablet (20 mg total) by mouth every other day. Takes at lunchtime  30 tablet    . insulin aspart (NOVOLOG) 100 UNIT/ML injection Inject 0-9 Units into the skin 3 (three) times daily with meals. 70-120=0units, 121-150=1units, 151-200=2units, 201-250=3units, 251-300=5units, 301-350=7units, 351-400=9units >401=Call MD      . lactulose (CHRONULAC) 10 GM/15ML solution Take 30 mLs (20 g total) by mouth 2 (two) times daily.  1892 mL  3  . levothyroxine (SYNTHROID,  LEVOTHROID) 75 MCG tablet Take 75 mcg by mouth daily.       Marland Kitchen LORazepam (ATIVAN) 2 MG tablet Take 2 mg by mouth every 6 (six) hours as needed for anxiety.      . mirtazapine (REMERON) 15 MG tablet Take 7.5 mg by mouth at bedtime.      . Multiple Vitamin (MULTIVITAMIN WITH MINERALS) TABS Take 1 tablet by mouth daily.      . Nutritional Supplements (PROMOD) LIQD Take 30 mLs by mouth 2 (two) times daily.      Marland Kitchen omeprazole (PRILOSEC) 20 MG capsule Take 20 mg by mouth daily.      Marland Kitchen oxycodone (OXY-IR) 5 MG capsule Take 1 capsule (5 mg total) by mouth every 4 (four) hours as needed for pain.  20 capsule  0  . polyethylene glycol (MIRALAX / GLYCOLAX) packet Take 17 g by mouth daily.      . potassium chloride (KLOR-CON) 10 MEQ CR tablet Take 10 mEq by mouth 2 (two) times daily.       . pregabalin (LYRICA) 50 MG capsule Take 50 mg by mouth 3 (three) times daily.      . promethazine (PHENERGAN) 25 MG tablet Take 0.5 tablets (12.5 mg total) by mouth every 6 (six) hours as needed for nausea.  30 tablet  0  . rifaximin (XIFAXAN) 550 MG TABS tablet Take 1,100 mg by mouth daily.        No current facility-administered medications for this visit.    Allergies as of 09/05/2013 - Review Complete 07/28/2013  Allergen Reaction Noted  . Aspirin Hives 04/18/2011  . Codeine Other (See Comments)   . Morphine and related Other (See Comments) 04/23/2011  . Penicillins Other (See Comments) 04/18/2011  . Sulfonamide derivatives Other (See Comments)   . Zolpidem tartrate Other (See Comments)     Family History  Problem Relation Age of Onset  . Colon cancer Neg Hx     History   Social History  . Marital Status: Widowed    Spouse Name: N/A    Number of Children: N/A  . Years of Education: N/A   Social History Main Topics  . Smoking status: Never Smoker   . Smokeless tobacco: Never Used  . Alcohol Use: No  . Drug Use: No  . Sexual Activity: No   Other Topics Concern  . None   Social History Narrative   . None    Review of Systems: As mentioned in HPI.   Physical Exam: BP 115/50  Pulse 95  Temp(Src) 97.8 F (36.6 C) (Oral)  Wt 119 lb (53.978 kg) General:   Alert and oriented to person and place only.  Mouth:  Oral mucosa pink and moist.  Heart:  S1, S2 present without murmurs, rubs, or gallops.  Abdomen:  +BS, soft, non-tender and non-distended. Limited exam with patient in wheelchair Msk:  Kyphosis noted Extremities:  Without edema.  Neurologic:  Alert and  Oriented to person only Psych:  Alert and cooperative. Normal mood and affect.

## 2013-09-05 NOTE — Patient Instructions (Signed)
Please have blood work done.   We would like you to have 3 soft bowel movements a day. This means you may need to take lactulose 3 times a day. You can titrate this as needed.   Please continue to drink the protein shakes.   If you continue to lose weight, we may need to do further investigation.   We will see you back in 3 months.

## 2013-09-06 NOTE — Assessment & Plan Note (Signed)
[place] of abdomen up-to-date; recheck LFTs today. Need to titrate Lactulose to achieve 3 soft bowel movements daily. MELD 17. Continue Xifaxan BID.  Weight loss noted; likely multifactorial but still concerning. Appears patient is non-compliant with speech recommendations regarding diet. Able to drink mountain dew without any issues. Denies dysphagia, EGD up-to-date. If continued weight loss, needs CT abd/pelvis to assess for occult malignancy. However, I feel this is more dietary related. Continue protein shake supplementation. Return in 3 months. Check LFTs today.

## 2013-09-07 NOTE — Progress Notes (Signed)
cc'd to pcp 

## 2013-09-13 NOTE — Progress Notes (Addendum)
Received outside labs dated 09/07/13:  Tbili 3.7, direct 1.1, indirect 2.6, AP 146, AST 41, ALT 14, Albumin 2. No significant changes in overall labs. [place] of abdomen on file.   I relayed this to patient's nurse, Raeanne Gathers. Change Xifaxan from once daily dosing to 550 mg BID. Increase Lactulose to 30 ml TID with goal of 2-3 soft bowel movements daily. Jeani Hawking informed me she was only having 1 BM daily currently.

## 2013-09-28 NOTE — Progress Notes (Signed)
REVIEWED.  

## 2013-09-28 NOTE — Progress Notes (Addendum)
REVIEWED. NEEDS AFP IN [facility] 2015 THE AFP/ULTRASOUND 2X/YEAR.

## 2013-10-10 ENCOUNTER — Telehealth: Payer: Self-pay

## 2013-10-10 NOTE — Telephone Encounter (Signed)
Jeani Hawking the nurse from the [facility] in [place] called because she wanted [facility] to know that they faxed over some lab work that was done on 10/06/13. There doctor increased her Lactulose to QID on the October 06, 2013. They would like to know if you would like to do anything work the her since you follow her so closely. When you call the nursing home ask for the [address] nurse or [employer].Please advise

## 2013-10-11 LAB — HEPATIC FUNCTION PANEL
ALT: 14 U/L (ref 7–35)
AST: 41 U/L
Albumin: 2
Alkaline Phosphatase: 146 U/L
BILIRUBIN INDIRECT: 2.6
Bilirubin, Direct: 1.1 mg/dL — AB (ref 0.01–0.4)
TOTAL PROTEIN: 5.9 g/dL
Total Bilirubin: 3.7 mg/dL

## 2013-10-11 NOTE — Telephone Encounter (Signed)
Patient is being treated for a UTI. Started Rocephin IM yesterday. Likely source of reported confusion, encephalopathy. Ammonia level 86. Lactulose increased to QID: completely agree with this. Xifaxan BID. Patient with poor po intake but drinks at least a liter of [facility] daily.   I spoke with Jeani Hawking, who is the primary nurse. Discussed with patient's age, not a candidate for anything such as liver transplant and supportive measures recommended. I have requested an updated INR, AFP, and BMP.   Outside labs to be abstracted:  Tbili 4.1, Direct 1.2, Indirect 2.9, AST 62, ALT 19, albumin 2.1. [place] of abdomen on file.

## 2013-10-13 LAB — HEPATIC FUNCTION PANEL
ALT: 19 U/L (ref 7–35)
AST: 62 U/L
Albumin: 2.1
Alkaline Phosphatase: 175 U/L
Ammonia: 86
BILIRUBIN DIRECT: 1.2 mg/dL — AB (ref 0.01–0.4)
Bilirubin, Indirect: 2.9
Total Bilirubin: 4.1 mg/dL

## 2013-10-20 ENCOUNTER — Telehealth: Payer: Self-pay | Admitting: Gastroenterology

## 2013-10-20 NOTE — Telephone Encounter (Signed)
[name] from [facility] called regarding patient. She said that she faxed labs to [place] on 4/8 and wanted to know if [facility] has any new lab orders on her and to call [phone] and ask for the [address] hall nurse. She said that [facility] was working closely with this lady.

## 2013-10-21 NOTE — Telephone Encounter (Signed)
I called Raeanne Gathers and she said the only thing she needs is to find out if Vicente Males needs more labs on pt. She is aware that Vicente Males is off today and will address this on Mon.

## 2013-10-25 NOTE — Telephone Encounter (Signed)
Reviewed labs dated 4/8.  BMP, INR, AFP.   Potassium 2.8, Cr 1.59, Calcium 8.1, Albumin 1.9,  INR 1.7, AFP 7.2  Electrolytes and renal function addressed by Dr. Sherrie Sport.   Received notification from Raeanne Gathers, RN, that patient is spitting meds in cup and pouring in trash can. DSS and Dr. Sherrie Sport are aware. Considering comfort care, which is appropriate given age and comorbidities.

## 2013-11-29 ENCOUNTER — Telehealth: Payer: Self-pay | Admitting: Gastroenterology

## 2013-11-29 NOTE — Telephone Encounter (Signed)
Sorry to hear this. Thank you for letting us know.

## 2013-11-29 NOTE — Telephone Encounter (Signed)
Someone from the nursing home called to cancel OV with [facility] on 12/12/13 because patient is now in hospice care

## 2013-11-29 NOTE — Telephone Encounter (Signed)
REVIEWED.  

## 2013-12-05 DEATH — deceased

## 2013-12-12 ENCOUNTER — Ambulatory Visit: Payer: Medicare Other | Admitting: Gastroenterology

## 2015-01-13 IMAGING — CR DG CHEST 1V PORT
1 series · 1 of 1 positions shown · non-contrast
Comparison: Chest radiograph 01/21/2013

CLINICAL DATA: Short of breath

PORTABLE CHEST - 1 VIEW

[AP]
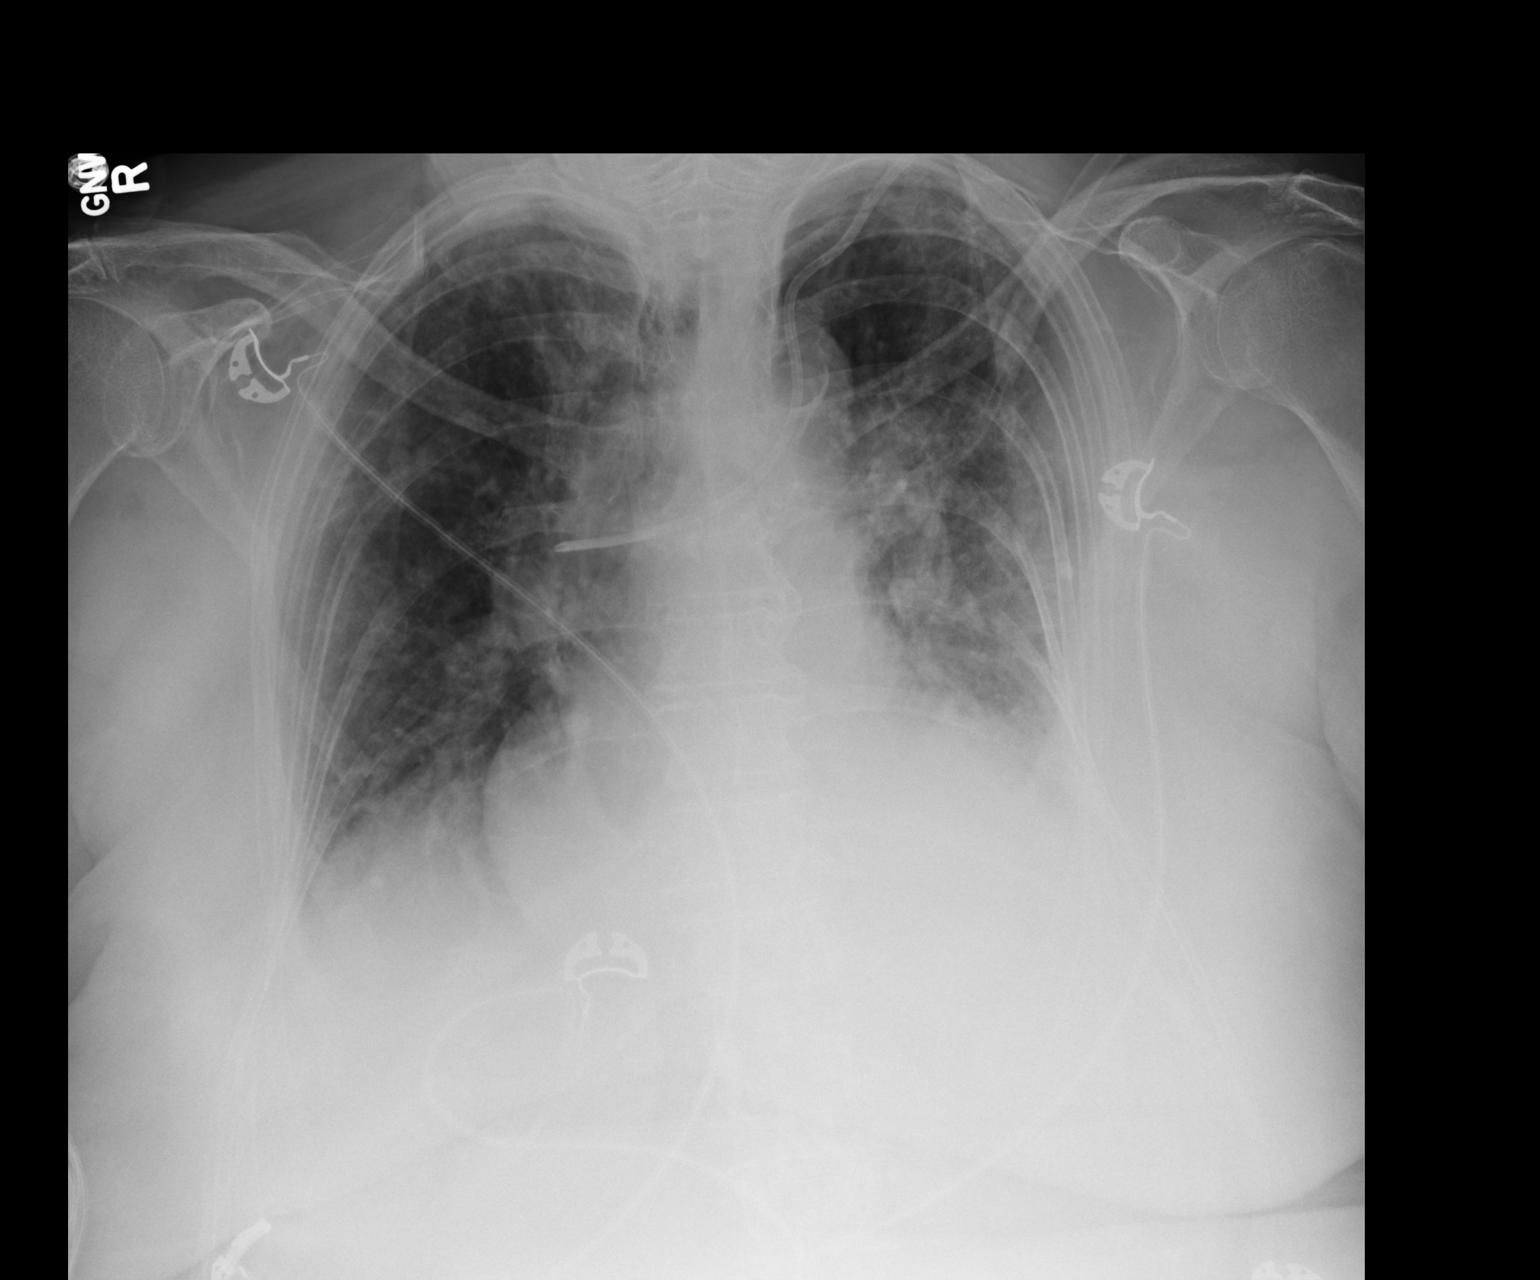

[1 of 1 positions shown; findings below may reference images not displayed]

FINDINGS: Left side central venous line is unchanged.  Stable
enlarged heart silhouette.  There are bilateral pleural effusions
and basilar atelectasis not changed.  There is central venous
congestion.  Low lung volumes.
IMPRESSION: 1.  No significant change.
2.  Bibasilar effusions and atelectasis.
3.  Central venous congestion.
